# Patient Record
Sex: Female | Born: 1955 | Race: White | Hispanic: No | Smoking: Never smoker
Health system: Southern US, Community
[De-identification: ages and names within clinical notes are randomized; demographics above are authoritative.]

## PROBLEM LIST (undated history)

## (undated) DIAGNOSIS — K5792 Diverticulitis of intestine, part unspecified, without perforation or abscess without bleeding: Secondary | ICD-10-CM

## (undated) DIAGNOSIS — E7800 Pure hypercholesterolemia, unspecified: Secondary | ICD-10-CM

## (undated) DIAGNOSIS — I10 Essential (primary) hypertension: Secondary | ICD-10-CM

## (undated) DIAGNOSIS — K219 Gastro-esophageal reflux disease without esophagitis: Secondary | ICD-10-CM

## (undated) DIAGNOSIS — F329 Major depressive disorder, single episode, unspecified: Secondary | ICD-10-CM

## (undated) DIAGNOSIS — R197 Diarrhea, unspecified: Secondary | ICD-10-CM

## (undated) DIAGNOSIS — F32A Depression, unspecified: Secondary | ICD-10-CM

## (undated) HISTORY — DX: Pure hypercholesterolemia, unspecified: E78.00

## (undated) HISTORY — PX: PATELLA FRACTURE SURGERY: SHX735

## (undated) HISTORY — PX: BREAST ENHANCEMENT SURGERY: SHX7

## (undated) HISTORY — DX: Major depressive disorder, single episode, unspecified: F32.9

## (undated) HISTORY — PX: NASAL SINUS SURGERY: SHX719

## (undated) HISTORY — PX: AUGMENTATION MAMMAPLASTY: SUR837

## (undated) HISTORY — DX: Depression, unspecified: F32.A

## (undated) HISTORY — DX: Gastro-esophageal reflux disease without esophagitis: K21.9

## (undated) HISTORY — DX: Essential (primary) hypertension: I10

## (undated) HISTORY — PX: CHOLECYSTECTOMY: SHX55

## (undated) HISTORY — PX: APPENDECTOMY: SHX54

---

## 2000-09-07 ENCOUNTER — Ambulatory Visit (HOSPITAL_COMMUNITY): Admission: RE | Admit: 2000-09-07 | Discharge: 2000-09-07 | Payer: Self-pay | Admitting: Cardiology

## 2005-02-06 ENCOUNTER — Ambulatory Visit: Payer: Self-pay | Admitting: Cardiology

## 2008-05-30 ENCOUNTER — Ambulatory Visit: Payer: Self-pay | Admitting: Cardiology

## 2010-07-24 ENCOUNTER — Ambulatory Visit (INDEPENDENT_AMBULATORY_CARE_PROVIDER_SITE_OTHER): Payer: BC Managed Care – PPO | Admitting: Internal Medicine

## 2010-07-24 DIAGNOSIS — R1310 Dysphagia, unspecified: Secondary | ICD-10-CM

## 2010-07-24 DIAGNOSIS — R195 Other fecal abnormalities: Secondary | ICD-10-CM

## 2010-08-12 NOTE — Consult Note (Signed)
NAME:  Joanna Sutton, Joanna Sutton                ACCOUNT NO.:  [id number]  MEDICAL RECORD NO.:  [id number]            PATIENT TYPE:  LOCATION: [facility]            FACILITY:  PHYSICIAN: Dr. Karilyn Cota  DATE OF BIRTH:  Jan 29, 1956  DATE OF CONSULTATION:  07/24/2010 DATE OF DISCHARGE:                                CONSULTATION   REASON FOR CONSULTATION:  Heme-positive stools, anemia, and dysphagia.  HISTORY OF PRESENT ILLNESS:  Chaselynn is a 55 year old female referred to our office by Dr. Donzetta Sprung in [place].  It was noted on her last exam that she had 3 heme-positive stool cards.  She states she feels tired for the most part.  She has no energy.  She further states that when she has a bowel movement she would see blood in the stool and on the toilet tissue.  She also complains of seeing mucus.  She does have lower abdominal pain.  She states that when she walks she feels like her intestines are going to fall out.  She usually has one bowel movement a day.  Her stools are dark brown to light brown in color.  She says she sees blood about every time she has a bowel movement.  Her last colonoscopy was in 2010 by Dr. Gabriel Cirri and it was normal.  On June 16, 2010, noted her hemoglobin was 10.5, hematocrit was 32.1, her MCV was 93, RBC count was slightly low at 3.44.  Her iron profile was normal, iron binding capacity was 268, UIBC 220, serum iron 48, iron saturation 18, ferritin 28, vitamin B12 of 335, folate 12.8.  Her TSH was 0.891, thyroxine 4.7, T3 uptake 30, free thyroxine index was 1.4, all were normal.  Glucose 83, BUN 15, creatinine 0.92, sodium 142, potassium 3.6, chloride 107, calcium 8.7, total protein 5.8, albumin 4.0, total bilirubin is 0.1, ALP 38, AST 14, and ALT 17.  She has had a weight loss of 42 pounds, but this was intentional.  Today, she complains also of dysphagia.  Foods feel like they are lodging in her midesophagus.  This has been occurring for about a  year.  Foods such as fish and breads will lodge.  Red meats will also lodge.  This is occurring about twice a week.  She denies any acid reflux or diarrhea.  She is allergic to PENICILLIN.  HOME MEDICATIONS: 1. Simvastatin 40 mg a day. 2. Calcium 600 mg a day. 3. Aspirin 81 mg a day. 4. Celexa 40 mg a day. 5. Stanback as needed. 6. Lisinopril 10 mg a day.  SURGERIES:  She has had a cholecystectomy, C-section, and bilateral breast implants.  She has a history of hypertension and high cholesterol.  Her mother is alive with a history of an MI and CVA.  Father is alive with coronary artery disease and he had a CABG with a 7-vessel bypass and he has a history of COPD, one brother in good health.  She is divorced.  She is a retired [employer] loss adjuster.  She occasionally smokes. She does not drink or do drugs.  She has one child in good health.  OBJECTIVE:  VITAL SIGNS:  Her weight is 130, height is 5 feet and  3 inches, temp is 98, blood pressure is 100/58, and pulse is 76. HEENT:  She has natural teeth.  Her oral mucosa is moist.  There are no lesions.  Her conjunctivae are pink.  Her sclerae are anicteric. NECK:  Her thyroid is normal.  There is no cervical lymphadenopathy. LUNGS:  Clear. HEART:  Regular rate and rhythm. ABDOMEN:  Soft.  Bowel sounds are positive.  No masses.  She does have tenderness on palpation to her lower abdomen.  Her stool was guaiac negative today.  ASSESSMENT:  Rotunda is a 55 year old female presenting today with 3 heme- positive stool cards.  Her hemoglobin is slightly low at 10.5 and hematocrit 32.  Her MCV is normal.  Her iron studies were normal. Inflammatory bowel disease or colonic neoplasm, AVM or polyp, or ulcer need to be ruled out.  She also complains of dysphagia.  An esophageal stricture needs to be ruled out.  RECOMMENDATIONS:  We will schedule a colonoscopy and EGD/ED with Dr. Karilyn Cota.  The risks and benefits were reviewed with the patient and  she is agreeable and I did discuss this case with Dr. Karilyn Cota.    ______________________________ Dorene Ar, NP   ______________________________ Dr. Karilyn Cota    TS/MEDQ  D:  07/24/2010  T:  07/25/2010  Job:  [id number]  Electronically Signed by Dorene Ar PA on 08/11/2010 09:03:33 AM Electronically Signed by Lorrin Goodell M.D. on 08/12/2010 10:51:41 AM

## 2010-08-14 ENCOUNTER — Ambulatory Visit (HOSPITAL_COMMUNITY)
Admission: RE | Admit: 2010-08-14 | Discharge: 2010-08-14 | Disposition: A | Discharge status: Home / Self Care | Payer: BC Managed Care – PPO | Source: Ambulatory Visit | Attending: Internal Medicine | Admitting: Internal Medicine

## 2010-08-14 ENCOUNTER — Other Ambulatory Visit (INDEPENDENT_AMBULATORY_CARE_PROVIDER_SITE_OTHER): Payer: Self-pay | Admitting: Internal Medicine

## 2010-08-14 ENCOUNTER — Encounter (HOSPITAL_BASED_OUTPATIENT_CLINIC_OR_DEPARTMENT_OTHER): Payer: BC Managed Care – PPO | Admitting: Internal Medicine

## 2010-08-14 DIAGNOSIS — Z79899 Other long term (current) drug therapy: Secondary | ICD-10-CM | POA: Insufficient documentation

## 2010-08-14 DIAGNOSIS — K449 Diaphragmatic hernia without obstruction or gangrene: Secondary | ICD-10-CM

## 2010-08-14 DIAGNOSIS — K921 Melena: Secondary | ICD-10-CM | POA: Insufficient documentation

## 2010-08-14 DIAGNOSIS — D509 Iron deficiency anemia, unspecified: Secondary | ICD-10-CM | POA: Insufficient documentation

## 2010-08-14 DIAGNOSIS — K625 Hemorrhage of anus and rectum: Secondary | ICD-10-CM

## 2010-08-14 DIAGNOSIS — K5730 Diverticulosis of large intestine without perforation or abscess without bleeding: Secondary | ICD-10-CM

## 2010-08-14 DIAGNOSIS — R1310 Dysphagia, unspecified: Secondary | ICD-10-CM | POA: Insufficient documentation

## 2010-08-14 DIAGNOSIS — E785 Hyperlipidemia, unspecified: Secondary | ICD-10-CM | POA: Insufficient documentation

## 2010-08-14 DIAGNOSIS — K222 Esophageal obstruction: Secondary | ICD-10-CM

## 2010-08-14 DIAGNOSIS — K259 Gastric ulcer, unspecified as acute or chronic, without hemorrhage or perforation: Secondary | ICD-10-CM

## 2010-08-14 DIAGNOSIS — K633 Ulcer of intestine: Secondary | ICD-10-CM | POA: Insufficient documentation

## 2010-08-14 DIAGNOSIS — D649 Anemia, unspecified: Secondary | ICD-10-CM

## 2010-08-14 DIAGNOSIS — I10 Essential (primary) hypertension: Secondary | ICD-10-CM | POA: Insufficient documentation

## 2010-08-14 LAB — HEMOGLOBIN AND HEMATOCRIT, BLOOD: HCT: 32.8 % — ABNORMAL LOW (ref 36.0–46.0)

## 2010-08-15 LAB — H. PYLORI ANTIBODY, IGG: H Pylori IgG: <0.4 {ISR}

## 2010-09-01 NOTE — Op Note (Signed)
NAME:  Joanna Sutton, Joanna Sutton                ACCOUNT NO.:  [id number]  MEDICAL RECORD NO.:  [id number]           PATIENT TYPE:  O  LOCATION:  [facility]                          FACILITY:  [facility]  PHYSICIAN:  Lionel December, M.D.    DATE OF BIRTH:  July 15, 1955  DATE OF PROCEDURE:  08/14/2010 DATE OF DISCHARGE:                              OPERATIVE REPORT   PROCEDURE:  Esophagogastroduodenoscopy with esophageal dilation followed by colonoscopy.  INDICATION:  Joanna Sutton is 55 year old Caucasian female who has dysphagia to solids.  She denies heartburn.  She is also found to be anemic.  She has had multiple heme-positive stools.  She also gives history of passing bright red blood with bowel movements and at times, burgundy stools. She is on low-dose asa and also takes 4-5 Stanback every day for headache.  The patient's last colonoscopy was up in [place] 2 years ago and the colon was normal.  Procedures were reviewed with the patient.  Informed consent was obtained.  MEDS FOR CONSCIOUS SEDATION:  Cetacaine spray for pharyngeal topical anesthesia, Demerol 50 mg IV, Versed 9 mg IV.  FINDINGS:  Procedure performed in endoscopy suite.  The patient's vital signs and O2 sat were monitored during the procedure and remained stable.  PROCEDURES: 1. Esophagogastroduodenoscopy.  The patient was placed in left lateral     recumbent position and Pentax videoscope was passed through     oropharynx without any difficulty into esophagus. 2. Esophagus.  Mucosa of the esophagus normal.  High-grade ring noted     at GE junction, was located at 32 cm from incisors.  Hiatus was 35. 3. Stomach.  It was empty and distended very well by insufflation.     Folds of proximal stomach were normal.  Examination of mucosa     revealed erosions at antrum along with edema, erythema, and a deep     ulcer on the right side towards the lesser curvature.  This ulcer     was about 3 x 6 mm, difficult to see.  Pyloric channel was patent.   Angularis, fundus, and cardia were examined by retroflexion of     scope and were normal. 4. Duodenum.  Bulbar and postbulbar mucosa and folds were normal.  Endoscope was pulled back to the stomach.  Balloon dilator was passed to the scope.  Guidewire was pushed into gastric lumen.  Balloon dilator was positioned across the ring, was dilated to a diameter of 15 mm, 16.5, and finally to 18 mm.  This disrupted ring effectively.  Balloon was deflated and withdrawn.  Endoscope was also withdrawn.  The patient was prepared for procedure #2.  COLONOSCOPY:  Rectal examination performed.  No abnormality noted on external or digital exam.  Pentax videoscope was placed through rectum and advanced under vision into sigmoid colon and beyond.  Preparation was fair.  She still has some pieces of formed stool and lot of liquid stool.  Few scattered diverticula noted at sigmoid colon.  Scope was passed into cecum which was identified by appendiceal orifice and ileocecal valve.  Pictures were taken for the record.  As the scope was withdrawn,  colonic mucosa was carefully examined.  There was a single ulcer at distal sigmoid colon about 10 x 12 mm.  Endoscopically, appeared to be benign ulcer.  Pictures taken followed by biopsy.  Rectal mucosa was normal.  Scope was retroflexed to examine anorectal junction and small hemorrhoids noted below the dentate line.  Withdrawal time was 11 minutes.  The patient tolerated the procedure well.  FINAL DIAGNOSES: 1. High-grade Schatzki's ring which was disrupted with a balloon     dilation up to 18 mm. 2. A 3-cm size sliding hiatal hernia. 3. Gastric ulcer at antrum along with multiple erosions.  Suspect     these changes are secondary to nonsteroidal antiinflammatory drug     therapy with need to rule out H. pylori infection. 4. Colonoscopy completed to cecum. 5. Few diverticula at sigmoid colon. 6. A single 10 x 12 mm ulcer at distal sigmoid colon, possibly  related     to nonsteroidal antiinflammatory drug therapy, ulcer was biopsied     for histology.  Small external hemorrhoids.  RECOMMENDATIONS: 1. The patient advised not to take any Stanback or similar OTC preps. 2. She can continue asa 81 mg daily. 3. Pantoprazole 40 mg p.o. b.i.d. 4. We will check her H and H and H. pylori today. 5. High-fiber diet.  I will be contacting patient with pending results and further recommendations.          ______________________________ Lionel December, M.D.     NR/MEDQ  D:  08/14/2010  T:  08/14/2010  Job:  [id number]  cc:   Donzetta Sprung Fax: [phone]  Electronically Signed by Lionel December M.D. on 09/01/2010 06:36:47 PM

## 2010-12-01 ENCOUNTER — Encounter (INDEPENDENT_AMBULATORY_CARE_PROVIDER_SITE_OTHER): Payer: Self-pay | Admitting: *Deleted

## 2010-12-18 ENCOUNTER — Ambulatory Visit (INDEPENDENT_AMBULATORY_CARE_PROVIDER_SITE_OTHER): Payer: BC Managed Care – PPO | Admitting: Internal Medicine

## 2012-11-29 ENCOUNTER — Ambulatory Visit (INDEPENDENT_AMBULATORY_CARE_PROVIDER_SITE_OTHER): Payer: BC Managed Care – PPO | Admitting: Internal Medicine

## 2012-11-29 ENCOUNTER — Encounter (INDEPENDENT_AMBULATORY_CARE_PROVIDER_SITE_OTHER): Payer: Self-pay | Admitting: Internal Medicine

## 2012-11-29 ENCOUNTER — Encounter (INDEPENDENT_AMBULATORY_CARE_PROVIDER_SITE_OTHER): Payer: Self-pay | Admitting: *Deleted

## 2012-11-29 VITALS — BP 90/42 | HR 76 | Temp 98.2°F | Ht 64.0 in | Wt 135.0 lb

## 2012-11-29 DIAGNOSIS — I10 Essential (primary) hypertension: Secondary | ICD-10-CM

## 2012-11-29 DIAGNOSIS — R1310 Dysphagia, unspecified: Secondary | ICD-10-CM | POA: Insufficient documentation

## 2012-11-29 DIAGNOSIS — E7800 Pure hypercholesterolemia, unspecified: Secondary | ICD-10-CM

## 2012-11-29 NOTE — Progress Notes (Signed)
Subjective:     Patient ID: Joanna Sutton, female   DOB: [birth year], 57 y.o.   MRN: [id number]  HPI Presents today with c/o dysphagia. She occasionally has foods that are lodging. This occurs in her upper esophagus. Symptoms for 6-7 months. She has to vomit the bolus up. Grilled peppers and onions and spicy foods are lodging. Chicken and steak are not lodging. She is chewing her food will.  She is not taking any aleve or ASA. She is avoiding greenpeppers, onions, spicy foods. She is avoiding biscuits. She takes the Protonix 40mg  BID x 1 month. She was taking Prilosec twice a day. Appetite is good. No weight loss. No abdominal pain. She usually has a BM about once a day. No melena or bright red rectal bleeding. Her last EGD/ED was in 2012  08/14/2010 EGD/ED/Colonoscopy: Dysphagia, Heme positive stools, bright red rectal bleeding FINAL DIAGNOSES:  1. High-grade Schatzki's ring which was disrupted with a balloon  dilation up to 18 mm.  2. A 3-cm size sliding hiatal hernia.  3. Gastric ulcer at antrum along with multiple erosions. Suspect  these changes are secondary to nonsteroidal antiinflammatory drug  therapy with need to rule out H. pylori infection.  4. Colonoscopy completed to cecum.  5. Few diverticula at sigmoid colon.  6. A single 10 x 12 mm ulcer at distal sigmoid colon, possibly related  to nonsteroidal antiinflammatory drug therapy, ulcer was biopsied  for histology. Small external hemorrhoids.  Review of Systems see hpi Current Outpatient Prescriptions  Medication Sig Dispense Refill  . acetaminophen (TYLENOL) 500 MG tablet Take 500 mg by mouth every 6 (six) hours as needed for pain.      Marland Kitchen atorvastatin (LIPITOR) 40 MG tablet Take 40 mg by mouth daily.      . COD LIVER OIL PO Take by mouth.      . diphenhydrAMINE (BENADRYL) 25 mg capsule Take 25 mg by mouth every 6 (six) hours as needed for itching.      . DULoxetine (CYMBALTA) 60 MG capsule Take 60 mg by mouth daily.       . furosemide (LASIX) 20 MG tablet Take 20 mg by mouth as needed.      . Iron-Vitamins (GERITOL PO) Take by mouth.      Marland Kitchen lisinopril-hydrochlorothiazide (PRINZIDE,ZESTORETIC) 10-12.5 MG per tablet Take 1 tablet by mouth daily.      . pantoprazole (PROTONIX) 20 MG tablet Take 20 mg by mouth 2 (two) times daily before a meal.      . traZODone (DESYREL) 50 MG tablet Take 50 mg by mouth at bedtime.       No current facility-administered medications for this visit.   Past Medical History  Diagnosis Date  . Hypertension   . Depression   . High cholesterol   . GERD (gastroesophageal reflux disease)    Past Surgical History  Procedure Laterality Date  . Cholecystectomy    . Appendectomy    . Breast enhancement surgery    . Cesarean section    . Nasal sinus surgery     Allergies  Allergen Reactions  . Penicillins         Objective:   Physical Exam   Filed Vitals:   11/29/12 1414  BP: 90/42  Pulse: 76  Temp: 98.2 F (36.8 C)  Height: 5\' 4"  (1.626 m)  Weight: 135 lb (61.236 kg)   Alert and oriented. Skin warm and dry. Oral mucosa is moist.   . Sclera anicteric, conjunctivae is  pink. Thyroid not enlarged. No cervical lymphadenopathy. Lungs clear. Heart regular rate and rhythm.  Abdomen is soft. Bowel sounds are positive. No hepatomegaly. No abdominal masses felt. No tenderness.  No edema to lower extremities.        Assessment:    Dysphagia to solids. Occuring 1-2 twice a week. She watches what she eats.    Plan:   EGD/ED with Dr Karilyn Cota. The risks and benefits such as perforation, bleeding, and infection were reviewed with the patient and is agreeable. Continue the Protonix.

## 2012-11-29 NOTE — Patient Instructions (Addendum)
EGD/ED. The risks and benefits such as perforation, bleeding, and infection were reviewed with the patient and is agreeable. 

## 2012-12-01 ENCOUNTER — Other Ambulatory Visit (INDEPENDENT_AMBULATORY_CARE_PROVIDER_SITE_OTHER): Payer: Self-pay | Admitting: *Deleted

## 2012-12-01 DIAGNOSIS — R1310 Dysphagia, unspecified: Secondary | ICD-10-CM

## 2012-12-21 ENCOUNTER — Encounter (HOSPITAL_COMMUNITY): Payer: Self-pay | Admitting: Pharmacy Technician

## 2013-01-04 ENCOUNTER — Ambulatory Visit (HOSPITAL_COMMUNITY)
Admission: RE | Admit: 2013-01-04 | Payer: BC Managed Care – PPO | Source: Ambulatory Visit | Admitting: Internal Medicine

## 2013-01-04 ENCOUNTER — Encounter (HOSPITAL_COMMUNITY): Admission: RE | Payer: Self-pay | Source: Ambulatory Visit

## 2013-01-04 SURGERY — ESOPHAGOGASTRODUODENOSCOPY (EGD) WITH ESOPHAGEAL DILATION
Anesthesia: Moderate Sedation

## 2013-02-10 ENCOUNTER — Encounter (INDEPENDENT_AMBULATORY_CARE_PROVIDER_SITE_OTHER): Payer: Self-pay

## 2014-09-19 ENCOUNTER — Ambulatory Visit (INDEPENDENT_AMBULATORY_CARE_PROVIDER_SITE_OTHER): Payer: BC Managed Care – PPO | Admitting: Internal Medicine

## 2014-09-19 ENCOUNTER — Encounter (INDEPENDENT_AMBULATORY_CARE_PROVIDER_SITE_OTHER): Payer: Self-pay | Admitting: Internal Medicine

## 2014-09-19 VITALS — BP 82/52 | HR 72 | Temp 98.4°F | Ht 62.0 in | Wt 120.9 lb

## 2014-09-19 DIAGNOSIS — R197 Diarrhea, unspecified: Secondary | ICD-10-CM

## 2014-09-19 MED ORDER — METRONIDAZOLE 250 MG PO TABS: 250.0000 mg | ORAL_TABLET | Freq: Three times a day (TID) | ORAL | Status: DC

## 2014-09-19 NOTE — Patient Instructions (Signed)
GI pathogen EGD/ED The risks and benefits such as perforation, bleeding, and infection were reviewed with the patient and is agreeable

## 2014-09-19 NOTE — Progress Notes (Signed)
Subjective:    Patient ID: Joanna Sutton, female    DOB: 12/08/1955, 60 y.o.   MRN: [id number]  HPI presents today with c/o that she thought she had a virus. On Aug 28, 2014 she visited a friend at a nursing home. Two days later she started to have vomiting and diarrhea.  The vomiting and diarrhea lasted about x 6-7 hours. The diarrhea lasted about 2 weeks. She saw Dr. Olena Heckle and was given an antibiotic. She was placed on Keflex x 2 weeks for the virus.  She had seen blood in her stool. Stool was negative for blood. She tells me she is still wearing Depends because of the diarrhea. She has incontinence. She says she does not have an appetite. She has early satiety.  She has had weight loss. IN 2014 she weighed 136. Today she weight 136. She c/o belching and her esophagus feels like it is on fire. She does have some dyphagia.  She takes NSAIDS on daily basis for headaches.        08/14/2010 EGD/ED/Colonoscopy: Dysphagia, Heme positive stools, bright red rectal bleeding FINAL DIAGNOSES:  1. High-grade Schatzki's ring which was disrupted with a balloon  dilation up to 18 mm.  2. A 3-cm size sliding hiatal hernia.  3. Gastric ulcer at antrum along with multiple erosions. Suspect  these changes are secondary to nonsteroidal antiinflammatory drug  therapy with need to rule out H. pylori infection.  4. Colonoscopy completed to cecum.  5. Few diverticula at sigmoid colon.  6. A single 10 x 12 mm ulcer at distal sigmoid colon, possibly related  to nonsteroidal antiinflammatory drug therapy, ulcer was biopsied  for histology. Small external hemorrhoids.   Review of Systems  Divorced. One child in good health.  Past Medical History  Diagnosis Date  . Hypertension   . Depression   . High cholesterol   . GERD (gastroesophageal reflux disease)     Past Surgical History  Procedure Laterality Date  . Cholecystectomy    . Appendectomy    . Breast enhancement surgery    . Cesarean  section    . Nasal sinus surgery      Allergies  Allergen Reactions  . Penicillins Rash    Current Outpatient Prescriptions on File Prior to Visit  Medication Sig Dispense Refill  . acetaminophen (TYLENOL) 500 MG tablet Take 500 mg by mouth every 6 (six) hours as needed for pain.    Marland Kitchen atorvastatin (LIPITOR) 40 MG tablet Take 40 mg by mouth daily.    . diphenhydrAMINE (BENADRYL) 25 mg capsule Take 25 mg by mouth every 6 (six) hours as needed for itching.    . pantoprazole (PROTONIX) 20 MG tablet Take 20 mg by mouth 2 (two) times daily before a meal.     No current facility-administered medications on file prior to visit.        Objective:   Physical ExamBlood pressure 82/52, pulse 72, temperature 98.4 F (36.9 C), height 5\' 2"  (1.575 m), weight 120 lb 14.4 oz (54.84 kg).  Alert and oriented. Skin warm and dry. Oral mucosa is moist.   . Sclera anicteric, conjunctivae is pink. Thyroid not enlarged. No cervical lymphadenopathy. Lungs clear. Heart regular rate and rhythm.  Abdomen is soft. Bowel sounds are positive. No hepatomegaly. No abdominal masses felt. No tendernes1+ edema to left  lower extremities.        Assessment & Plan:  GERD, not controlled at this time. Continue the Protonix. Avoid spicy foods.  Stop all NSAIDS. GI pathogen. Flagyl 250mg  TID x  10 days.  EGD/ED

## 2014-09-20 ENCOUNTER — Telehealth (INDEPENDENT_AMBULATORY_CARE_PROVIDER_SITE_OTHER): Payer: Self-pay | Admitting: Internal Medicine

## 2014-09-20 NOTE — Telephone Encounter (Signed)
Message left not to start the antibiotic till she has the stool specimen. (cell phone)

## 2014-09-24 ENCOUNTER — Other Ambulatory Visit (INDEPENDENT_AMBULATORY_CARE_PROVIDER_SITE_OTHER): Payer: Self-pay | Admitting: *Deleted

## 2014-09-24 DIAGNOSIS — R1310 Dysphagia, unspecified: Secondary | ICD-10-CM

## 2014-09-24 LAB — GASTROINTESTINAL PATHOGEN PANEL PCR

## 2014-09-26 ENCOUNTER — Other Ambulatory Visit (INDEPENDENT_AMBULATORY_CARE_PROVIDER_SITE_OTHER): Payer: Self-pay | Admitting: Internal Medicine

## 2014-09-27 ENCOUNTER — Encounter (HOSPITAL_COMMUNITY)
Admission: RE | Disposition: A | Discharge status: Home / Self Care | Payer: Self-pay | Source: Ambulatory Visit | Attending: Internal Medicine

## 2014-09-27 ENCOUNTER — Ambulatory Visit (HOSPITAL_COMMUNITY)
Admission: RE | Admit: 2014-09-27 | Discharge: 2014-09-27 | Disposition: A | Discharge status: Home / Self Care | Payer: BC Managed Care – PPO | Source: Ambulatory Visit | Attending: Internal Medicine | Admitting: Internal Medicine

## 2014-09-27 ENCOUNTER — Encounter (HOSPITAL_COMMUNITY): Payer: Self-pay | Admitting: *Deleted

## 2014-09-27 DIAGNOSIS — R1310 Dysphagia, unspecified: Secondary | ICD-10-CM

## 2014-09-27 DIAGNOSIS — K257 Chronic gastric ulcer without hemorrhage or perforation: Secondary | ICD-10-CM | POA: Diagnosis not present

## 2014-09-27 DIAGNOSIS — Z881 Allergy status to other antibiotic agents status: Secondary | ICD-10-CM | POA: Insufficient documentation

## 2014-09-27 DIAGNOSIS — K449 Diaphragmatic hernia without obstruction or gangrene: Secondary | ICD-10-CM | POA: Insufficient documentation

## 2014-09-27 DIAGNOSIS — K222 Esophageal obstruction: Secondary | ICD-10-CM | POA: Diagnosis not present

## 2014-09-27 DIAGNOSIS — I10 Essential (primary) hypertension: Secondary | ICD-10-CM | POA: Insufficient documentation

## 2014-09-27 DIAGNOSIS — E780 Pure hypercholesterolemia: Secondary | ICD-10-CM | POA: Diagnosis not present

## 2014-09-27 DIAGNOSIS — K2950 Unspecified chronic gastritis without bleeding: Secondary | ICD-10-CM | POA: Insufficient documentation

## 2014-09-27 DIAGNOSIS — F329 Major depressive disorder, single episode, unspecified: Secondary | ICD-10-CM | POA: Diagnosis not present

## 2014-09-27 DIAGNOSIS — Z880 Allergy status to penicillin: Secondary | ICD-10-CM | POA: Diagnosis not present

## 2014-09-27 DIAGNOSIS — K259 Gastric ulcer, unspecified as acute or chronic, without hemorrhage or perforation: Secondary | ICD-10-CM | POA: Insufficient documentation

## 2014-09-27 DIAGNOSIS — K2210 Ulcer of esophagus without bleeding: Secondary | ICD-10-CM | POA: Diagnosis not present

## 2014-09-27 DIAGNOSIS — Q394 Esophageal web: Secondary | ICD-10-CM | POA: Diagnosis not present

## 2014-09-27 DIAGNOSIS — K219 Gastro-esophageal reflux disease without esophagitis: Secondary | ICD-10-CM | POA: Diagnosis not present

## 2014-09-27 HISTORY — PX: ESOPHAGOGASTRODUODENOSCOPY: SHX5428

## 2014-09-27 HISTORY — PX: ESOPHAGEAL DILATION: SHX303

## 2014-09-27 HISTORY — DX: Diarrhea, unspecified: R19.7

## 2014-09-27 LAB — GASTROINTESTINAL PATHOGEN PANEL PCR
C. difficile Tox A/B, PCR: NEGATIVE
CAMPYLOBACTER, PCR: NEGATIVE
Cryptosporidium, PCR: NEGATIVE
E COLI (ETEC) LT/ST, PCR: NEGATIVE
E COLI 0157, PCR: NEGATIVE
E coli (STEC) stx1/stx2, PCR: NEGATIVE
Giardia lamblia, PCR: NEGATIVE
NOROVIRUS, PCR: NEGATIVE
Rotavirus A, PCR: NEGATIVE
Salmonella, PCR: NEGATIVE
Shigella, PCR: NEGATIVE

## 2014-09-27 SURGERY — EGD (ESOPHAGOGASTRODUODENOSCOPY)
Anesthesia: Moderate Sedation

## 2014-09-27 MED ORDER — MIDAZOLAM HCL 5 MG/5ML IJ SOLN
INTRAMUSCULAR | Status: AC
  Filled 2014-09-27: qty 10

## 2014-09-27 MED ORDER — DICYCLOMINE HCL 10 MG PO CAPS: 10.0000 mg | ORAL_CAPSULE | Freq: Three times a day (TID) | ORAL | Status: DC | PRN

## 2014-09-27 MED ORDER — STERILE WATER FOR IRRIGATION IR SOLN
Status: DC | PRN
  Administered 2014-09-27: 09:00:00

## 2014-09-27 MED ORDER — SODIUM CHLORIDE 0.9 % IV SOLN
INTRAVENOUS | Status: DC
  Administered 2014-09-27: 09:00:00 via INTRAVENOUS

## 2014-09-27 MED ORDER — BUTAMBEN-TETRACAINE-BENZOCAINE 2-2-14 % EX AERO
INHALATION_SPRAY | CUTANEOUS | Status: DC | PRN
Start: 2014-09-27 — End: 2014-09-27
  Administered 2014-09-27: 2 via TOPICAL

## 2014-09-27 MED ORDER — MEPERIDINE HCL 50 MG/ML IJ SOLN
INTRAMUSCULAR | Status: AC
  Filled 2014-09-27: qty 1

## 2014-09-27 MED ORDER — PANTOPRAZOLE SODIUM 40 MG PO TBEC: 40.0000 mg | DELAYED_RELEASE_TABLET | Freq: Two times a day (BID) | ORAL | Status: AC

## 2014-09-27 MED ORDER — MIDAZOLAM HCL 5 MG/5ML IJ SOLN
INTRAMUSCULAR | Status: DC | PRN
  Administered 2014-09-27: 1 mg via INTRAVENOUS
  Administered 2014-09-27 (×3): 2 mg via INTRAVENOUS

## 2014-09-27 MED ORDER — MEPERIDINE HCL 50 MG/ML IJ SOLN
INTRAMUSCULAR | Status: DC | PRN
  Administered 2014-09-27: 25 mg via INTRAVENOUS

## 2014-09-27 NOTE — Discharge Instructions (Signed)
Stop metronidazole. Do not take alendronate for 4 weeks. Pantoprazole 40 mg by mouth 30 minutes before breakfast and evening meal daily. Dicyclomine 10 mg by mouth up to 3 times a day a day as needed before meals. Resume other medications as before. Resume usual diet. Do not take ibuprofen and Aleve or other OTC NSAIDs but can take Tylenol up to 2 g per day as needed. Physician will call with results of biopsy and stool studies.  PATIENT INSTRUCTIONS POST-ANESTHESIA  IMMEDIATELY FOLLOWING SURGERY:  Do not drive or operate machinery for the first twenty four hours after surgery.  Do not make any important decisions for twenty four hours after surgery or while taking narcotic pain medications or sedatives.  If you develop intractable nausea and vomiting or a severe headache please notify your doctor immediately.  FOLLOW-UP:  Please make an appointment with your surgeon as instructed. You do not need to follow up with anesthesia unless specifically instructed to do so.  WOUND CARE INSTRUCTIONS (if applicable):  Keep a dry clean dressing on the anesthesia/puncture wound site if there is drainage.  Once the wound has quit draining you may leave it open to air.  Generally you should leave the bandage intact for twenty four hours unless there is drainage.  If the epidural site drains for more than 36-48 hours please call the anesthesia department.  QUESTIONS?:  Please feel free to call your physician or the hospital operator if you have any questions, and they will be happy to assist you.

## 2014-09-27 NOTE — Op Note (Signed)
EGD PROCEDURE REPORT  PATIENT:  Joanna Sutton  MR#:  [id number] Birthdate:  May 14, 1955, 59 y.o., female Endoscopist:  Dr. Rogene Houston, MD Referred By:  Dr. Gar Ponto, MD Procedure Date: 09/27/2014  Procedure:   EGD with ED  Indications:  Patient is 60 year old Caucasian female who presents with several week history of dysphagia with solids as well as pills. She has history of Schatzki's ring which was last dilated disrupted in May 2012. Patient also has diarrhea 4 weeks duration unresponsive to metronidazole and stool studies are pending.            Informed Consent:  The risks, benefits, alternatives & imponderables which include, but are not limited to, bleeding, infection, perforation, drug reaction and potential missed lesion have been reviewed.  The potential for biopsy, lesion removal, esophageal dilation, etc. have also been discussed.  Questions have been answered.  All parties agreeable.  Please see history & physical in medical record for more information.  Medications:  Demerol 25 mg IV Versed 7 mg IV Cetacaine spray topically for oropharyngeal anesthesia  Description of procedure:  The endoscope was introduced through the mouth and advanced to the second portion of the duodenum without difficulty or limitations. The mucosal surfaces were surveyed very carefully during advancement of the scope and upon withdrawal.  Findings:  Esophagus:  Mucosal appearance and proximal esophagus suggestive for web. Coarse appearance to mucosa at esophageal body with subtle circumferential ring. Small ulcer distal esophagus extending to GE junction. Prominent ring noted at GE junction. GEJ:  32 cm Hiatus:  30 cm Stomach:  Stomach was empty and distended very well with insufflation. Folds in the proximal stomach were normal. Examination of mucosa at gastric body was normal. 2 ulcers noted at gastric antrum. One towards anterior wall was 4 mm. Second ulcer towards lesser curvature was 6 mm and  quite deep. This ulcer was biopsied for histology. Marked edema and erythema noted to mucosa in the prepyloric/pyloric region.pyloric channel was patent.  Duodenum:  Normal bulbar and post bulbar mucosa.   Therapeutic/Diagnostic Maneuvers Performed:   Esophagus dilated by passing 46 French Maloney dilator to full insertion. Esophagus examined post dilation and linear mucosal disruption noted at cervical esophagus and ring was also noted to have been disrupted. Multiple biopsies taken from gastric ulcer and esophageal body.  Complications:  None  Impression: Esophageal web and prominent Schatzki's ring.Both of these were disrupted by passing 36 [place] Maloney dilator.  Small ulcer at distal esophagus extending into GE junction. Small sliding hiatal hernia. Two gastric ulcers at antrum measuring 4 and 6 mm. Ulcer towards lesser curvature was much deeper. It was biopsied for histology. Mark edema to prepyloric and pyloric channel mucosa.  Recommendations:  Discontinue metronidazole. Do not take alendronate for 4 weeks. Increase pantoprazole to 40 mg by mouth twice a day. Dicyclomine 10 mg by mouth 3 times a day when necessary. Patient advised not to take NSAIDs. I will be contacting patient with results of biopsy and stool studies. Follow-up EGD in 12 weeks.  Fleda Pagel U  09/27/2014  10:01 AM  CC: Dr. Gar Ponto, MD & Dr. Rayne Du ref. provider found

## 2014-09-27 NOTE — H&P (Signed)
Joanna Sutton is an 59 y.o. female.   Chief Complaint: Patient is here for EGD and ED. HPI: Patient is 59 year old Caucasian female with history of GERD and Schatzki's ring was esophagus was last dilated in May 2012 now presents with recurrent solid food dysphagia. She's also having difficulty with pills. She had multiple episodes where she has to regurgitate food in order to get relief. Heartburns well controlled with PPI. She also complains of one month history of diarrhea. Symptoms began with nausea vomiting abdominal pain and diarrhea. Nausea vomiting is improved with diarrhea has not. She was seen in the office last week. GI pathogen panel was requested and she provided stool yesterday. She has been on metronidazole for 6 days but cannot tell any difference. She did take Keflex for 1 week before her symptoms started. She has lost 6 pounds. She has not had fever melena or rectal bleeding.  Past Medical History  Diagnosis Date  . Hypertension   . Depression   . High cholesterol   . GERD (gastroesophageal reflux disease)   . Diarrhea     Past Surgical History  Procedure Laterality Date  . Cholecystectomy    . Appendectomy    . Breast enhancement surgery    . Cesarean section    . Nasal sinus surgery      History reviewed. No pertinent family history. Social History:  reports that she has never smoked. She does not have any smokeless tobacco history on file. She reports that she does not drink alcohol or use illicit drugs.  Allergies:  Allergies  Allergen Reactions  . Levaquin [Levofloxacin] Nausea And Vomiting  . Penicillins Rash    Medications Prior to Admission  Medication Sig Dispense Refill  . alendronate (FOSAMAX) 70 MG tablet Take 1 tablet by mouth once a week.    Marland Kitchen atorvastatin (LIPITOR) 40 MG tablet Take 40 mg by mouth daily.    Marland Kitchen escitalopram (LEXAPRO) 10 MG tablet Take 10 mg by mouth daily.    . metroNIDAZOLE (FLAGYL) 250 MG tablet Take 1 tablet (250 mg total) by  mouth 3 (three) times daily. 30 tablet 0  . pantoprazole (PROTONIX) 40 MG tablet Take 1 tablet by mouth daily.    Marland Kitchen acetaminophen (TYLENOL) 500 MG tablet Take 500 mg by mouth every 6 (six) hours as needed for pain.    . diphenhydrAMINE (BENADRYL) 25 mg capsule Take 25 mg by mouth every 6 (six) hours as needed for itching.      No results found for this or any previous visit (from the past 48 hour(s)). No results found.  ROS  Blood pressure 86/48, pulse 62, temperature 98 F (36.7 C), temperature source Oral, resp. rate 13, height 5\' 2"  (1.575 m), weight 120 lb (54.432 kg), SpO2 98 %. Physical Exam  Constitutional: She appears well-developed and well-nourished.  HENT:  Mouth/Throat: Oropharynx is clear and moist.  Eyes: Conjunctivae are normal. No scleral icterus.  Neck: No thyromegaly present.  Cardiovascular: Normal rate, regular rhythm and normal heart sounds.   No murmur heard. Respiratory: Effort normal and breath sounds normal.  GI: Soft. She exhibits no distension and no mass. There is tenderness (mild tenderness at left mid abdomen).  Musculoskeletal: She exhibits no edema.  Lymphadenopathy:    She has no cervical adenopathy.  Neurological: She is alert.  Skin: Skin is warm and dry.     Assessment/Plan Solid food dysphagia. History of Schatzki's ring. Diarrhea. EGD with ED.  Joanna Sutton U 09/27/2014, 9:26 AM

## 2014-10-01 ENCOUNTER — Encounter (HOSPITAL_COMMUNITY): Payer: Self-pay | Admitting: Internal Medicine

## 2014-10-03 ENCOUNTER — Encounter (INDEPENDENT_AMBULATORY_CARE_PROVIDER_SITE_OTHER): Payer: Self-pay | Admitting: *Deleted

## 2014-11-22 ENCOUNTER — Other Ambulatory Visit (INDEPENDENT_AMBULATORY_CARE_PROVIDER_SITE_OTHER): Payer: Self-pay | Admitting: *Deleted

## 2014-11-22 ENCOUNTER — Encounter (INDEPENDENT_AMBULATORY_CARE_PROVIDER_SITE_OTHER): Payer: Self-pay | Admitting: *Deleted

## 2014-11-22 ENCOUNTER — Encounter (INDEPENDENT_AMBULATORY_CARE_PROVIDER_SITE_OTHER): Payer: Self-pay | Admitting: Internal Medicine

## 2014-11-22 ENCOUNTER — Ambulatory Visit (INDEPENDENT_AMBULATORY_CARE_PROVIDER_SITE_OTHER): Payer: BC Managed Care – PPO | Admitting: Internal Medicine

## 2014-11-22 VITALS — BP 102/52 | HR 64 | Temp 98.1°F | Ht 62.0 in | Wt 119.3 lb

## 2014-11-22 DIAGNOSIS — K2210 Ulcer of esophagus without bleeding: Secondary | ICD-10-CM

## 2014-11-22 DIAGNOSIS — K259 Gastric ulcer, unspecified as acute or chronic, without hemorrhage or perforation: Secondary | ICD-10-CM

## 2014-11-22 NOTE — Patient Instructions (Addendum)
EGD. No NSAIDs

## 2014-11-22 NOTE — Progress Notes (Signed)
Subjective:    Patient ID: Joanna Sutton, female    DOB: 05/18/1955, 59 y.o.   MRN: [id number]  HPI Here today for f/u after recent EGD/ED in June. Please see below.  She tells me she feels good. Appetite is good. Protonix BID helps her acid reflux. No abdominal pain. She usually has a BM x 2 daily. She is not having any diarrhea. No melena or BRR.     09/27/2014 EGD with ED  Indications: Patient is 59 year old Caucasian female who presents with several week history of dysphagia with solids as well as pills. She has history of Schatzki's ring which was last dilated disrupted in May 2012. Patient also has diarrhea 4 weeks duration unresponsive to metronidazole and stool studies are pending. Impression: Esophageal web and prominent Schatzki's ring.Both of these were disrupted by passing 38 [place] dilator.  Small ulcer at distal esophagus extending into GE junction. Small sliding hiatal hernia. Two gastric ulcers at antrum measuring 4 and 6 mm. Ulcer towards lesser curvature was much deeper. It was biopsied for histology. Mark edema to prepyloric and pyloric channel mucosa.  Notes Recorded by Rogene Houston, MD on 10/01/2014 at 4:57 PM Biopsy results reviewed with patient. Esophageal biopsy negative for EOE and gastric biopsy reveals benign ulcer and no H. pylori Patient advised to refrain from using NSAIDs. She needs to see Dr. Quillian Quince for management of her headache/migraine. Patient advised to take fiber supplement 4 g bedtime and Imodium 2 mg by mouth bedtime. Office visit in 8 weeks.  She will need follow-up EGD to document complete healing of gastric ulcers. Report to PCP    Review of Systems Past Medical History  Diagnosis Date  . Hypertension   . Depression   . High cholesterol   . GERD (gastroesophageal reflux disease)   . Diarrhea     Past  Surgical History  Procedure Laterality Date  . Cholecystectomy    . Appendectomy    . Breast enhancement surgery    . Cesarean section    . Nasal sinus surgery    . Esophagogastroduodenoscopy N/A 09/27/2014    Procedure: ESOPHAGOGASTRODUODENOSCOPY (EGD);  Surgeon: Rogene Houston, MD;  Location: [facility];  Service: Endoscopy;  Laterality: N/A;  930  . Esophageal dilation N/A 09/27/2014    Procedure: ESOPHAGEAL DILATION;  Surgeon: Rogene Houston, MD;  Location: [facility];  Service: Endoscopy;  Laterality: N/A;    Allergies  Allergen Reactions  . Levaquin [Levofloxacin] Nausea And Vomiting  . Penicillins Rash    Current Outpatient Prescriptions on File Prior to Visit  Medication Sig Dispense Refill  . acetaminophen (TYLENOL) 500 MG tablet Take 500 mg by mouth every 6 (six) hours as needed for pain.    Marland Kitchen atorvastatin (LIPITOR) 40 MG tablet Take 40 mg by mouth daily.    Marland Kitchen dicyclomine (BENTYL) 10 MG capsule Take 1 capsule (10 mg total) by mouth 3 (three) times daily as needed for spasms. 90 capsule 1  . diphenhydrAMINE (BENADRYL) 25 mg capsule Take 25 mg by mouth every 6 (six) hours as needed for itching.    . escitalopram (LEXAPRO) 10 MG tablet Take 10 mg by mouth daily.    . pantoprazole (PROTONIX) 40 MG tablet Take 1 tablet (40 mg total) by mouth 2 (two) times daily before a meal. 60 tablet 3   No current facility-administered medications on file prior to visit.        Objective:   Physical Exam Blood pressure  102/52, pulse 64, temperature 98.1 F (36.7 C), height 5\' 2"  (1.575 m), weight 119 lb 4.8 oz (54.114 kg). Alert and oriented. Skin warm and dry. Oral mucosa is moist.   . Sclera anicteric, conjunctivae is pink. Thyroid not enlarged. No cervical lymphadenopathy. Lungs clear. Heart regular rate and rhythm.  Abdomen is soft. Bowel sounds are positive. No hepatomegaly. No abdominal masses felt. No tenderness.  No edema to lower extremities.         Assessment & Plan:   Hx of esophageal and gastric ulcer. She needs surveillance to be sure ulcers have healed. EGD.

## 2015-01-03 ENCOUNTER — Encounter (HOSPITAL_COMMUNITY)
Admission: RE | Disposition: A | Discharge status: Home / Self Care | Payer: Self-pay | Source: Ambulatory Visit | Attending: Internal Medicine

## 2015-01-03 ENCOUNTER — Ambulatory Visit (HOSPITAL_COMMUNITY)
Admission: RE | Admit: 2015-01-03 | Discharge: 2015-01-03 | Disposition: A | Discharge status: Home / Self Care | Payer: BC Managed Care – PPO | Source: Ambulatory Visit | Attending: Internal Medicine | Admitting: Internal Medicine

## 2015-01-03 ENCOUNTER — Encounter (HOSPITAL_COMMUNITY): Payer: Self-pay

## 2015-01-03 DIAGNOSIS — Z79899 Other long term (current) drug therapy: Secondary | ICD-10-CM | POA: Insufficient documentation

## 2015-01-03 DIAGNOSIS — K259 Gastric ulcer, unspecified as acute or chronic, without hemorrhage or perforation: Secondary | ICD-10-CM | POA: Diagnosis not present

## 2015-01-03 DIAGNOSIS — K2970 Gastritis, unspecified, without bleeding: Secondary | ICD-10-CM | POA: Diagnosis not present

## 2015-01-03 DIAGNOSIS — E780 Pure hypercholesterolemia: Secondary | ICD-10-CM | POA: Insufficient documentation

## 2015-01-03 DIAGNOSIS — K219 Gastro-esophageal reflux disease without esophagitis: Secondary | ICD-10-CM | POA: Insufficient documentation

## 2015-01-03 DIAGNOSIS — I10 Essential (primary) hypertension: Secondary | ICD-10-CM | POA: Insufficient documentation

## 2015-01-03 DIAGNOSIS — F329 Major depressive disorder, single episode, unspecified: Secondary | ICD-10-CM | POA: Diagnosis not present

## 2015-01-03 HISTORY — PX: ESOPHAGOGASTRODUODENOSCOPY: SHX5428

## 2015-01-03 SURGERY — EGD (ESOPHAGOGASTRODUODENOSCOPY)
Anesthesia: Moderate Sedation

## 2015-01-03 MED ORDER — BUTAMBEN-TETRACAINE-BENZOCAINE 2-2-14 % EX AERO
INHALATION_SPRAY | CUTANEOUS | Status: DC | PRN
  Administered 2015-01-03: 2 via TOPICAL

## 2015-01-03 MED ORDER — MIDAZOLAM HCL 5 MG/5ML IJ SOLN
INTRAMUSCULAR | Status: AC
  Filled 2015-01-03: qty 10

## 2015-01-03 MED ORDER — STERILE WATER FOR IRRIGATION IR SOLN
Status: DC | PRN
  Administered 2015-01-03: 15:00:00

## 2015-01-03 MED ORDER — MEPERIDINE HCL 50 MG/ML IJ SOLN
INTRAMUSCULAR | Status: DC | PRN
  Administered 2015-01-03 (×2): 25 mg via INTRAVENOUS

## 2015-01-03 MED ORDER — SODIUM CHLORIDE 0.9 % IV SOLN
INTRAVENOUS | Status: DC
  Administered 2015-01-03: 14:00:00 via INTRAVENOUS

## 2015-01-03 MED ORDER — MIDAZOLAM HCL 5 MG/5ML IJ SOLN
INTRAMUSCULAR | Status: DC | PRN
Start: 2015-01-03 — End: 2015-01-03
  Administered 2015-01-03: 2 mg via INTRAVENOUS
  Administered 2015-01-03: 1 mg via INTRAVENOUS

## 2015-01-03 MED ORDER — MEPERIDINE HCL 50 MG/ML IJ SOLN
INTRAMUSCULAR | Status: AC
  Filled 2015-01-03: qty 1

## 2015-01-03 NOTE — H&P (Signed)
Joanna Sutton is an 59 y.o. female.   Chief Complaint: Patient is here for EGD and possible ED. HPI: Patient is 59 year old Caucasian female who under an EGD with dilation 2 months ago when she presented with dysphagia. Schatzki's ring as well as esophageal web and evidence of reflux esophagitis into gastric ulcers. Biopsy from the large gastric ulcer revealed benign etiology and age but only stains were negative. Patient has stayed off NSAIDs and is on PPI. She is returning for follow-up exam to document complete healing of these gastric ulcers. She says she's not having epigastric pain anymore. She denies melena or rectal bleeding. She is not using OTC NSAIDs anymore.  Past Medical History  Diagnosis Date  . Hypertension   . Depression   . High cholesterol   . GERD (gastroesophageal reflux disease)   . Diarrhea     Past Surgical History  Procedure Laterality Date  . Cholecystectomy    . Appendectomy    . Breast enhancement surgery    . Cesarean section    . Nasal sinus surgery    . Esophagogastroduodenoscopy N/A 09/27/2014    Procedure: ESOPHAGOGASTRODUODENOSCOPY (EGD);  Surgeon: Rogene Houston, MD;  Location: [facility];  Service: Endoscopy;  Laterality: N/A;  930  . Esophageal dilation N/A 09/27/2014    Procedure: ESOPHAGEAL DILATION;  Surgeon: Rogene Houston, MD;  Location: [facility];  Service: Endoscopy;  Laterality: N/A;    History reviewed. No pertinent family history. Social History:  reports that she has never smoked. She does not have any smokeless tobacco history on file. She reports that she does not drink alcohol or use illicit drugs.  Allergies:  Allergies  Allergen Reactions  . Levaquin [Levofloxacin] Nausea And Vomiting  . Penicillins Rash    Medications Prior to Admission  Medication Sig Dispense Refill  . acetaminophen (TYLENOL) 500 MG tablet Take 500 mg by mouth every 6 (six) hours as needed for pain.    Marland Kitchen atorvastatin (LIPITOR) 40 MG tablet Take 40  mg by mouth daily.    Marland Kitchen dicyclomine (BENTYL) 10 MG capsule Take 1 capsule (10 mg total) by mouth 3 (three) times daily as needed for spasms. 90 capsule 1  . diphenhydrAMINE (BENADRYL) 25 mg capsule Take 25 mg by mouth every 6 (six) hours as needed for itching.    . escitalopram (LEXAPRO) 10 MG tablet Take 10 mg by mouth daily.    . pantoprazole (PROTONIX) 40 MG tablet Take 1 tablet (40 mg total) by mouth 2 (two) times daily before a meal. 60 tablet 3    No results found for this or any previous visit (from the past 48 hour(s)). No results found.  ROS  Blood pressure 108/52, pulse 61, temperature 98.4 F (36.9 C), temperature source Oral, resp. rate 16, height 5\' 2"  (1.575 m), weight 119 lb (53.978 kg), SpO2 97 %. Physical Exam  Constitutional: She appears well-developed and well-nourished.  HENT:  Mouth/Throat: Oropharynx is clear and moist.  Eyes: Conjunctivae are normal. No scleral icterus.  Neck: No thyromegaly present.  Cardiovascular: Normal rate, regular rhythm and normal heart sounds.   No murmur heard. Respiratory: Effort normal and breath sounds normal.  GI: Soft. She exhibits no distension and no mass. There is no tenderness.  Musculoskeletal: She exhibits no edema.  Lymphadenopathy:    She has no cervical adenopathy.  Neurological: She is alert.  Skin: Skin is warm and dry.     Assessment/Plan History of gastric ulcers. EGD to document  healing of peptic ulcer disease. Esophageal dilation if ring felt to be critical.  Joanna Sutton U 01/03/2015, 3:10 PM

## 2015-01-03 NOTE — Op Note (Addendum)
EGD PROCEDURE REPORT  PATIENT:  Joanna Sutton  MR#:  [id number] Birthdate:  03/07/1956, 59 y.o., female Endoscopist:  Dr. Rogene Houston, MD Referred By:  Dr. Glenda Chroman, MD  Procedure Date: 01/03/2015  Procedure:   EGD(ED not needed)  Indications:  Patient is 59 year old Caucasian female who underwent EGD with ED for 3 months skin was found to have 2 gastric ulcers. Biopsy revealed benign etiology and H. pylori stains were negative. Patient has been using OTC NSAIDs. She's been treated with double dose PPI. She denies using OTC NSAIDs. She says she is not having epigastric pain anymore. She also denies dysphagia.           Informed Consent:  The risks, benefits, alternatives & imponderables which include, but are not limited to, bleeding, infection, perforation, drug reaction and potential missed lesion have been reviewed.  The potential for biopsy, lesion removal, esophageal dilation, etc. have also been discussed.  Questions have been answered.  All parties agreeable.  Please see history & physical in medical record for more information.  Medications:  Demerol 50 mg IV Versed 3 mg IV Cetacaine spray topically for oropharyngeal anesthesia  Description of procedure:  The endoscope was introduced through the mouth and advanced to the second portion of the duodenum without difficulty or limitations. The mucosal surfaces were surveyed very carefully during advancement of the scope and upon withdrawal.  Findings:  Esophagus:  Mucosa of the esophagus was normal. GE junction was unremarkable without ring or stricture formation. GEJ:  34 cm Stomach:  Stomach was empty and distended very well with insufflation. Folds in the proximal stomach were normal. Examination mucosa at gastric body was normal. There was edema and erythema and granularity to prepyloric mucosa along with four ulcers. Two of these ulcers were 3 mm each. Other two were deep and measured 5 and 7 mm. Angularis fundus and cardia  were unremarkable. Duodenum:  Normal bulbar and post bulbar mucosa.  Therapeutic/Diagnostic Maneuvers Performed:  Multiple biopsies were taken from margin of two gastric ulcers and submitted separately.  Complications:  None   EBL: Minimal  Impression: Healed esophagitis and no evidence of recurrent Schatzki's ring. Gastritis with four prepyloric ulcers. On last exam she only had two ulcers. Multiple biopsies taken from margin of two of the larger gastric ulcers.  Comment: Nonhealing gastric ulcers most likely secondary to continued NSAID use.  Recommendations:  Standard instructions given.  Patient informed that she cannot use OTC NSAIDs. I will be contacting patient with biopsy results and further recommendations.   Joanna Sutton  01/03/2015  3:34 PM  CC: Dr. Glenda Chroman, MD

## 2015-01-03 NOTE — Discharge Instructions (Signed)
Remember you cannot take Advil or Aleve BC Goody powder or similar medications. Resume usual medications and diet. No driving for 24 hours. Physician will call with biopsy results and further recommendations.      Esophagogastroduodenoscopy Care After Refer to this sheet in the next few weeks. These instructions provide you with information on caring for yourself after your procedure. Your caregiver may also give you more specific instructions. Your treatment has been planned according to current medical practices, but problems sometimes occur. Call your caregiver if you have any problems or questions after your procedure.  HOME CARE INSTRUCTIONS  Do not eat or drink anything until the numbing medicine (local anesthetic) has worn off and your gag reflex has returned. You will know that the local anesthetic has worn off when you can swallow comfortably.  Do not drive for 12 hours after the procedure or as directed by your caregiver.  Only take medicines as directed by your caregiver. SEEK MEDICAL CARE IF:   You cannot stop coughing.  You are not urinating at all or less than usual. SEEK IMMEDIATE MEDICAL CARE IF:  You have difficulty swallowing.  You cannot eat or drink.  You have worsening throat or chest pain.  You have dizziness, lightheadedness, or you faint.  You have nausea or vomiting.  You have chills.  You have a fever.  You have severe abdominal pain.  You have black, tarry, or bloody stools. Document Released: 03/09/2012 Document Reviewed: 03/09/2012 [facility] Patient Information 2015 [place]. This information is not intended to replace advice given to you by your health care provider. Make sure you discuss any questions you have with your health care provider.

## 2015-01-08 ENCOUNTER — Encounter (HOSPITAL_COMMUNITY): Payer: Self-pay | Admitting: Internal Medicine

## 2015-04-22 ENCOUNTER — Encounter (INDEPENDENT_AMBULATORY_CARE_PROVIDER_SITE_OTHER): Payer: Self-pay | Admitting: *Deleted

## 2017-10-20 ENCOUNTER — Encounter (INDEPENDENT_AMBULATORY_CARE_PROVIDER_SITE_OTHER): Payer: BC Managed Care – PPO | Admitting: Ophthalmology

## 2017-10-20 DIAGNOSIS — H43813 Vitreous degeneration, bilateral: Secondary | ICD-10-CM | POA: Diagnosis not present

## 2017-10-20 DIAGNOSIS — H2513 Age-related nuclear cataract, bilateral: Secondary | ICD-10-CM | POA: Diagnosis not present

## 2017-12-21 ENCOUNTER — Encounter (INDEPENDENT_AMBULATORY_CARE_PROVIDER_SITE_OTHER): Payer: BC Managed Care – PPO | Admitting: Ophthalmology

## 2018-12-21 ENCOUNTER — Other Ambulatory Visit: Payer: Self-pay

## 2018-12-21 DIAGNOSIS — U071 COVID-19: Secondary | ICD-10-CM

## 2018-12-22 LAB — NOVEL CORONAVIRUS, NAA: SARS-CoV-2, NAA: DETECTED — AB

## 2019-02-16 DIAGNOSIS — Z6827 Body mass index (BMI) 27.0-27.9, adult: Secondary | ICD-10-CM | POA: Diagnosis not present

## 2019-02-16 DIAGNOSIS — Z299 Encounter for prophylactic measures, unspecified: Secondary | ICD-10-CM | POA: Diagnosis not present

## 2019-02-16 DIAGNOSIS — R0989 Other specified symptoms and signs involving the circulatory and respiratory systems: Secondary | ICD-10-CM | POA: Diagnosis not present

## 2019-02-16 DIAGNOSIS — Z713 Dietary counseling and surveillance: Secondary | ICD-10-CM | POA: Diagnosis not present

## 2019-02-17 DIAGNOSIS — M25562 Pain in left knee: Secondary | ICD-10-CM | POA: Diagnosis not present

## 2019-02-17 DIAGNOSIS — M1712 Unilateral primary osteoarthritis, left knee: Secondary | ICD-10-CM | POA: Diagnosis not present

## 2019-04-18 DIAGNOSIS — R5383 Other fatigue: Secondary | ICD-10-CM | POA: Diagnosis not present

## 2019-04-18 DIAGNOSIS — Z7189 Other specified counseling: Secondary | ICD-10-CM | POA: Diagnosis not present

## 2019-04-18 DIAGNOSIS — F322 Major depressive disorder, single episode, severe without psychotic features: Secondary | ICD-10-CM | POA: Diagnosis not present

## 2019-04-18 DIAGNOSIS — Z79899 Other long term (current) drug therapy: Secondary | ICD-10-CM | POA: Diagnosis not present

## 2019-04-18 DIAGNOSIS — Z1211 Encounter for screening for malignant neoplasm of colon: Secondary | ICD-10-CM | POA: Diagnosis not present

## 2019-04-18 DIAGNOSIS — Z1331 Encounter for screening for depression: Secondary | ICD-10-CM | POA: Diagnosis not present

## 2019-04-18 DIAGNOSIS — Z299 Encounter for prophylactic measures, unspecified: Secondary | ICD-10-CM | POA: Diagnosis not present

## 2019-04-18 DIAGNOSIS — Z1339 Encounter for screening examination for other mental health and behavioral disorders: Secondary | ICD-10-CM | POA: Diagnosis not present

## 2019-04-18 DIAGNOSIS — Z0000 Encounter for general adult medical examination without abnormal findings: Secondary | ICD-10-CM | POA: Diagnosis not present

## 2019-04-18 DIAGNOSIS — E7800 Pure hypercholesterolemia, unspecified: Secondary | ICD-10-CM | POA: Diagnosis not present

## 2019-04-18 DIAGNOSIS — R0989 Other specified symptoms and signs involving the circulatory and respiratory systems: Secondary | ICD-10-CM | POA: Diagnosis not present

## 2019-04-18 DIAGNOSIS — K219 Gastro-esophageal reflux disease without esophagitis: Secondary | ICD-10-CM | POA: Diagnosis not present

## 2019-04-18 DIAGNOSIS — Z6827 Body mass index (BMI) 27.0-27.9, adult: Secondary | ICD-10-CM | POA: Diagnosis not present

## 2019-04-19 DIAGNOSIS — L0100 Impetigo, unspecified: Secondary | ICD-10-CM | POA: Diagnosis not present

## 2019-04-19 DIAGNOSIS — L570 Actinic keratosis: Secondary | ICD-10-CM | POA: Diagnosis not present

## 2019-04-19 DIAGNOSIS — L309 Dermatitis, unspecified: Secondary | ICD-10-CM | POA: Diagnosis not present

## 2019-05-10 DIAGNOSIS — L309 Dermatitis, unspecified: Secondary | ICD-10-CM | POA: Diagnosis not present

## 2019-05-10 DIAGNOSIS — L732 Hidradenitis suppurativa: Secondary | ICD-10-CM | POA: Diagnosis not present

## 2019-05-15 DIAGNOSIS — K1329 Other disturbances of oral epithelium, including tongue: Secondary | ICD-10-CM | POA: Diagnosis not present

## 2019-06-08 DIAGNOSIS — Z6827 Body mass index (BMI) 27.0-27.9, adult: Secondary | ICD-10-CM | POA: Diagnosis not present

## 2019-06-08 DIAGNOSIS — Z299 Encounter for prophylactic measures, unspecified: Secondary | ICD-10-CM | POA: Diagnosis not present

## 2019-06-08 DIAGNOSIS — M810 Age-related osteoporosis without current pathological fracture: Secondary | ICD-10-CM | POA: Diagnosis not present

## 2019-06-08 DIAGNOSIS — F322 Major depressive disorder, single episode, severe without psychotic features: Secondary | ICD-10-CM | POA: Diagnosis not present

## 2019-06-08 DIAGNOSIS — I10 Essential (primary) hypertension: Secondary | ICD-10-CM | POA: Diagnosis not present

## 2019-06-08 DIAGNOSIS — E7800 Pure hypercholesterolemia, unspecified: Secondary | ICD-10-CM | POA: Diagnosis not present

## 2019-07-05 DIAGNOSIS — F410 Panic disorder [episodic paroxysmal anxiety] without agoraphobia: Secondary | ICD-10-CM | POA: Diagnosis not present

## 2019-07-05 DIAGNOSIS — E7800 Pure hypercholesterolemia, unspecified: Secondary | ICD-10-CM | POA: Diagnosis not present

## 2019-10-04 DIAGNOSIS — I10 Essential (primary) hypertension: Secondary | ICD-10-CM | POA: Diagnosis not present

## 2019-10-04 DIAGNOSIS — E785 Hyperlipidemia, unspecified: Secondary | ICD-10-CM | POA: Diagnosis not present

## 2019-10-04 DIAGNOSIS — K219 Gastro-esophageal reflux disease without esophagitis: Secondary | ICD-10-CM | POA: Diagnosis not present

## 2019-10-04 DIAGNOSIS — M810 Age-related osteoporosis without current pathological fracture: Secondary | ICD-10-CM | POA: Diagnosis not present

## 2019-11-07 DIAGNOSIS — Z299 Encounter for prophylactic measures, unspecified: Secondary | ICD-10-CM | POA: Diagnosis not present

## 2019-11-07 DIAGNOSIS — F322 Major depressive disorder, single episode, severe without psychotic features: Secondary | ICD-10-CM | POA: Diagnosis not present

## 2019-11-07 DIAGNOSIS — I10 Essential (primary) hypertension: Secondary | ICD-10-CM | POA: Diagnosis not present

## 2019-11-07 DIAGNOSIS — E7800 Pure hypercholesterolemia, unspecified: Secondary | ICD-10-CM | POA: Diagnosis not present

## 2019-11-10 DIAGNOSIS — Z299 Encounter for prophylactic measures, unspecified: Secondary | ICD-10-CM | POA: Diagnosis not present

## 2019-11-10 DIAGNOSIS — F322 Major depressive disorder, single episode, severe without psychotic features: Secondary | ICD-10-CM | POA: Diagnosis not present

## 2019-11-10 DIAGNOSIS — I10 Essential (primary) hypertension: Secondary | ICD-10-CM | POA: Diagnosis not present

## 2019-12-05 DIAGNOSIS — I10 Essential (primary) hypertension: Secondary | ICD-10-CM | POA: Diagnosis not present

## 2020-01-04 DIAGNOSIS — K219 Gastro-esophageal reflux disease without esophagitis: Secondary | ICD-10-CM | POA: Diagnosis not present

## 2020-01-04 DIAGNOSIS — I10 Essential (primary) hypertension: Secondary | ICD-10-CM | POA: Diagnosis not present

## 2020-01-04 DIAGNOSIS — E785 Hyperlipidemia, unspecified: Secondary | ICD-10-CM | POA: Diagnosis not present

## 2020-01-04 DIAGNOSIS — M810 Age-related osteoporosis without current pathological fracture: Secondary | ICD-10-CM | POA: Diagnosis not present

## 2020-01-15 DIAGNOSIS — L309 Dermatitis, unspecified: Secondary | ICD-10-CM | POA: Diagnosis not present

## 2020-01-15 DIAGNOSIS — L0100 Impetigo, unspecified: Secondary | ICD-10-CM | POA: Diagnosis not present

## 2020-01-24 DIAGNOSIS — L0100 Impetigo, unspecified: Secondary | ICD-10-CM | POA: Diagnosis not present

## 2020-01-31 DIAGNOSIS — B370 Candidal stomatitis: Secondary | ICD-10-CM | POA: Diagnosis not present

## 2020-01-31 DIAGNOSIS — Z299 Encounter for prophylactic measures, unspecified: Secondary | ICD-10-CM | POA: Diagnosis not present

## 2020-02-02 DIAGNOSIS — I10 Essential (primary) hypertension: Secondary | ICD-10-CM | POA: Diagnosis not present

## 2020-02-02 DIAGNOSIS — E785 Hyperlipidemia, unspecified: Secondary | ICD-10-CM | POA: Diagnosis not present

## 2020-02-02 DIAGNOSIS — K219 Gastro-esophageal reflux disease without esophagitis: Secondary | ICD-10-CM | POA: Diagnosis not present

## 2020-02-02 DIAGNOSIS — M810 Age-related osteoporosis without current pathological fracture: Secondary | ICD-10-CM | POA: Diagnosis not present

## 2020-02-03 DIAGNOSIS — I10 Essential (primary) hypertension: Secondary | ICD-10-CM | POA: Diagnosis not present

## 2020-02-05 DIAGNOSIS — L0100 Impetigo, unspecified: Secondary | ICD-10-CM | POA: Diagnosis not present

## 2020-02-07 DIAGNOSIS — I10 Essential (primary) hypertension: Secondary | ICD-10-CM | POA: Diagnosis not present

## 2020-02-07 DIAGNOSIS — F322 Major depressive disorder, single episode, severe without psychotic features: Secondary | ICD-10-CM | POA: Diagnosis not present

## 2020-02-07 DIAGNOSIS — Z23 Encounter for immunization: Secondary | ICD-10-CM | POA: Diagnosis not present

## 2020-02-07 DIAGNOSIS — Z299 Encounter for prophylactic measures, unspecified: Secondary | ICD-10-CM | POA: Diagnosis not present

## 2020-03-05 DIAGNOSIS — I10 Essential (primary) hypertension: Secondary | ICD-10-CM | POA: Diagnosis not present

## 2020-03-28 DIAGNOSIS — F322 Major depressive disorder, single episode, severe without psychotic features: Secondary | ICD-10-CM | POA: Diagnosis not present

## 2020-03-28 DIAGNOSIS — Z299 Encounter for prophylactic measures, unspecified: Secondary | ICD-10-CM | POA: Diagnosis not present

## 2020-03-28 DIAGNOSIS — R300 Dysuria: Secondary | ICD-10-CM | POA: Diagnosis not present

## 2020-03-28 DIAGNOSIS — N390 Urinary tract infection, site not specified: Secondary | ICD-10-CM | POA: Diagnosis not present

## 2020-03-28 DIAGNOSIS — I10 Essential (primary) hypertension: Secondary | ICD-10-CM | POA: Diagnosis not present

## 2020-04-04 DIAGNOSIS — I10 Essential (primary) hypertension: Secondary | ICD-10-CM | POA: Diagnosis not present

## 2020-04-05 DIAGNOSIS — I10 Essential (primary) hypertension: Secondary | ICD-10-CM | POA: Diagnosis not present

## 2020-04-05 DIAGNOSIS — M810 Age-related osteoporosis without current pathological fracture: Secondary | ICD-10-CM | POA: Diagnosis not present

## 2020-04-05 DIAGNOSIS — E785 Hyperlipidemia, unspecified: Secondary | ICD-10-CM | POA: Diagnosis not present

## 2020-05-02 DIAGNOSIS — Z1339 Encounter for screening examination for other mental health and behavioral disorders: Secondary | ICD-10-CM | POA: Diagnosis not present

## 2020-05-02 DIAGNOSIS — I10 Essential (primary) hypertension: Secondary | ICD-10-CM | POA: Diagnosis not present

## 2020-05-02 DIAGNOSIS — Z1331 Encounter for screening for depression: Secondary | ICD-10-CM | POA: Diagnosis not present

## 2020-05-02 DIAGNOSIS — E7800 Pure hypercholesterolemia, unspecified: Secondary | ICD-10-CM | POA: Diagnosis not present

## 2020-05-02 DIAGNOSIS — Z0000 Encounter for general adult medical examination without abnormal findings: Secondary | ICD-10-CM | POA: Diagnosis not present

## 2020-05-02 DIAGNOSIS — R5383 Other fatigue: Secondary | ICD-10-CM | POA: Diagnosis not present

## 2020-05-02 DIAGNOSIS — Z6826 Body mass index (BMI) 26.0-26.9, adult: Secondary | ICD-10-CM | POA: Diagnosis not present

## 2020-05-02 DIAGNOSIS — Z7189 Other specified counseling: Secondary | ICD-10-CM | POA: Diagnosis not present

## 2020-05-02 DIAGNOSIS — Z299 Encounter for prophylactic measures, unspecified: Secondary | ICD-10-CM | POA: Diagnosis not present

## 2020-05-03 DIAGNOSIS — E7800 Pure hypercholesterolemia, unspecified: Secondary | ICD-10-CM | POA: Diagnosis not present

## 2020-05-03 DIAGNOSIS — R5383 Other fatigue: Secondary | ICD-10-CM | POA: Diagnosis not present

## 2020-05-03 DIAGNOSIS — Z79899 Other long term (current) drug therapy: Secondary | ICD-10-CM | POA: Diagnosis not present

## 2020-05-06 DIAGNOSIS — I10 Essential (primary) hypertension: Secondary | ICD-10-CM | POA: Diagnosis not present

## 2020-05-06 DIAGNOSIS — M810 Age-related osteoporosis without current pathological fracture: Secondary | ICD-10-CM | POA: Diagnosis not present

## 2020-05-06 DIAGNOSIS — E785 Hyperlipidemia, unspecified: Secondary | ICD-10-CM | POA: Diagnosis not present

## 2020-06-03 DIAGNOSIS — I10 Essential (primary) hypertension: Secondary | ICD-10-CM | POA: Diagnosis not present

## 2020-07-03 DIAGNOSIS — E7800 Pure hypercholesterolemia, unspecified: Secondary | ICD-10-CM | POA: Diagnosis not present

## 2020-07-03 DIAGNOSIS — E785 Hyperlipidemia, unspecified: Secondary | ICD-10-CM | POA: Diagnosis not present

## 2020-07-03 DIAGNOSIS — I10 Essential (primary) hypertension: Secondary | ICD-10-CM | POA: Diagnosis not present

## 2020-07-04 DIAGNOSIS — I10 Essential (primary) hypertension: Secondary | ICD-10-CM | POA: Diagnosis not present

## 2020-07-29 DIAGNOSIS — J069 Acute upper respiratory infection, unspecified: Secondary | ICD-10-CM | POA: Diagnosis not present

## 2020-07-29 DIAGNOSIS — Z299 Encounter for prophylactic measures, unspecified: Secondary | ICD-10-CM | POA: Diagnosis not present

## 2020-08-02 DIAGNOSIS — I10 Essential (primary) hypertension: Secondary | ICD-10-CM | POA: Diagnosis not present

## 2020-08-03 DIAGNOSIS — I10 Essential (primary) hypertension: Secondary | ICD-10-CM | POA: Diagnosis not present

## 2020-08-03 DIAGNOSIS — E559 Vitamin D deficiency, unspecified: Secondary | ICD-10-CM | POA: Diagnosis not present

## 2020-08-03 DIAGNOSIS — E785 Hyperlipidemia, unspecified: Secondary | ICD-10-CM | POA: Diagnosis not present

## 2020-08-12 DIAGNOSIS — J40 Bronchitis, not specified as acute or chronic: Secondary | ICD-10-CM | POA: Diagnosis not present

## 2020-08-12 DIAGNOSIS — F339 Major depressive disorder, recurrent, unspecified: Secondary | ICD-10-CM | POA: Diagnosis not present

## 2020-08-12 DIAGNOSIS — I10 Essential (primary) hypertension: Secondary | ICD-10-CM | POA: Diagnosis not present

## 2020-08-12 DIAGNOSIS — N6489 Other specified disorders of breast: Secondary | ICD-10-CM | POA: Diagnosis not present

## 2020-08-12 DIAGNOSIS — Z299 Encounter for prophylactic measures, unspecified: Secondary | ICD-10-CM | POA: Diagnosis not present

## 2020-09-03 DIAGNOSIS — I10 Essential (primary) hypertension: Secondary | ICD-10-CM | POA: Diagnosis not present

## 2020-09-12 DIAGNOSIS — Z87891 Personal history of nicotine dependence: Secondary | ICD-10-CM | POA: Diagnosis not present

## 2020-09-12 DIAGNOSIS — Z299 Encounter for prophylactic measures, unspecified: Secondary | ICD-10-CM | POA: Diagnosis not present

## 2020-09-12 DIAGNOSIS — I10 Essential (primary) hypertension: Secondary | ICD-10-CM | POA: Diagnosis not present

## 2020-09-14 DIAGNOSIS — S32058A Other fracture of fifth lumbar vertebra, initial encounter for closed fracture: Secondary | ICD-10-CM | POA: Diagnosis not present

## 2020-09-14 DIAGNOSIS — J9811 Atelectasis: Secondary | ICD-10-CM | POA: Diagnosis not present

## 2020-09-14 DIAGNOSIS — Z043 Encounter for examination and observation following other accident: Secondary | ICD-10-CM | POA: Diagnosis not present

## 2020-09-14 DIAGNOSIS — R4182 Altered mental status, unspecified: Secondary | ICD-10-CM | POA: Diagnosis not present

## 2020-09-14 DIAGNOSIS — W19XXXA Unspecified fall, initial encounter: Secondary | ICD-10-CM | POA: Diagnosis not present

## 2020-09-14 DIAGNOSIS — R031 Nonspecific low blood-pressure reading: Secondary | ICD-10-CM | POA: Diagnosis not present

## 2020-09-14 DIAGNOSIS — S32038A Other fracture of third lumbar vertebra, initial encounter for closed fracture: Secondary | ICD-10-CM | POA: Diagnosis not present

## 2020-09-14 DIAGNOSIS — R519 Headache, unspecified: Secondary | ICD-10-CM | POA: Diagnosis not present

## 2020-09-14 DIAGNOSIS — Y998 Other external cause status: Secondary | ICD-10-CM | POA: Diagnosis not present

## 2020-09-14 DIAGNOSIS — S3210XA Unspecified fracture of sacrum, initial encounter for closed fracture: Secondary | ICD-10-CM | POA: Diagnosis not present

## 2020-09-14 DIAGNOSIS — R4020 Unspecified coma: Secondary | ICD-10-CM | POA: Diagnosis not present

## 2020-09-14 DIAGNOSIS — W010XXA Fall on same level from slipping, tripping and stumbling without subsequent striking against object, initial encounter: Secondary | ICD-10-CM | POA: Diagnosis not present

## 2020-09-14 DIAGNOSIS — R410 Disorientation, unspecified: Secondary | ICD-10-CM | POA: Diagnosis not present

## 2020-09-14 DIAGNOSIS — S0990XA Unspecified injury of head, initial encounter: Secondary | ICD-10-CM | POA: Diagnosis not present

## 2020-09-14 DIAGNOSIS — R0989 Other specified symptoms and signs involving the circulatory and respiratory systems: Secondary | ICD-10-CM | POA: Diagnosis not present

## 2020-09-14 DIAGNOSIS — S3219XA Other fracture of sacrum, initial encounter for closed fracture: Secondary | ICD-10-CM | POA: Diagnosis not present

## 2020-09-14 DIAGNOSIS — W228XXA Striking against or struck by other objects, initial encounter: Secondary | ICD-10-CM | POA: Diagnosis not present

## 2020-09-14 DIAGNOSIS — S32028A Other fracture of second lumbar vertebra, initial encounter for closed fracture: Secondary | ICD-10-CM | POA: Diagnosis not present

## 2020-09-14 DIAGNOSIS — R590 Localized enlarged lymph nodes: Secondary | ICD-10-CM | POA: Diagnosis not present

## 2020-09-14 DIAGNOSIS — S22048A Other fracture of fourth thoracic vertebra, initial encounter for closed fracture: Secondary | ICD-10-CM | POA: Diagnosis not present

## 2020-09-14 DIAGNOSIS — S22058A Other fracture of T5-T6 vertebra, initial encounter for closed fracture: Secondary | ICD-10-CM | POA: Diagnosis not present

## 2020-09-14 DIAGNOSIS — R55 Syncope and collapse: Secondary | ICD-10-CM | POA: Diagnosis not present

## 2020-09-15 DIAGNOSIS — S22048A Other fracture of fourth thoracic vertebra, initial encounter for closed fracture: Secondary | ICD-10-CM | POA: Diagnosis not present

## 2020-09-15 DIAGNOSIS — S3210XA Unspecified fracture of sacrum, initial encounter for closed fracture: Secondary | ICD-10-CM | POA: Diagnosis not present

## 2020-09-15 DIAGNOSIS — S32028A Other fracture of second lumbar vertebra, initial encounter for closed fracture: Secondary | ICD-10-CM | POA: Diagnosis not present

## 2020-09-15 DIAGNOSIS — S32058A Other fracture of fifth lumbar vertebra, initial encounter for closed fracture: Secondary | ICD-10-CM | POA: Diagnosis not present

## 2020-09-15 DIAGNOSIS — S22058A Other fracture of T5-T6 vertebra, initial encounter for closed fracture: Secondary | ICD-10-CM | POA: Diagnosis not present

## 2020-09-15 DIAGNOSIS — S32038A Other fracture of third lumbar vertebra, initial encounter for closed fracture: Secondary | ICD-10-CM | POA: Diagnosis not present

## 2020-09-17 DIAGNOSIS — I10 Essential (primary) hypertension: Secondary | ICD-10-CM | POA: Diagnosis not present

## 2020-09-17 DIAGNOSIS — S20219A Contusion of unspecified front wall of thorax, initial encounter: Secondary | ICD-10-CM | POA: Diagnosis not present

## 2020-09-17 DIAGNOSIS — W19XXXA Unspecified fall, initial encounter: Secondary | ICD-10-CM | POA: Diagnosis not present

## 2020-09-17 DIAGNOSIS — Z299 Encounter for prophylactic measures, unspecified: Secondary | ICD-10-CM | POA: Diagnosis not present

## 2020-10-03 DIAGNOSIS — I10 Essential (primary) hypertension: Secondary | ICD-10-CM | POA: Diagnosis not present

## 2020-10-03 DIAGNOSIS — E559 Vitamin D deficiency, unspecified: Secondary | ICD-10-CM | POA: Diagnosis not present

## 2020-10-03 DIAGNOSIS — E785 Hyperlipidemia, unspecified: Secondary | ICD-10-CM | POA: Diagnosis not present

## 2020-11-01 DIAGNOSIS — I10 Essential (primary) hypertension: Secondary | ICD-10-CM | POA: Diagnosis not present

## 2020-11-25 DIAGNOSIS — M1711 Unilateral primary osteoarthritis, right knee: Secondary | ICD-10-CM | POA: Diagnosis not present

## 2020-11-25 DIAGNOSIS — M25552 Pain in left hip: Secondary | ICD-10-CM | POA: Diagnosis not present

## 2020-11-25 DIAGNOSIS — M1712 Unilateral primary osteoarthritis, left knee: Secondary | ICD-10-CM | POA: Diagnosis not present

## 2020-12-04 DIAGNOSIS — E559 Vitamin D deficiency, unspecified: Secondary | ICD-10-CM | POA: Diagnosis not present

## 2020-12-04 DIAGNOSIS — E785 Hyperlipidemia, unspecified: Secondary | ICD-10-CM | POA: Diagnosis not present

## 2020-12-04 DIAGNOSIS — I10 Essential (primary) hypertension: Secondary | ICD-10-CM | POA: Diagnosis not present

## 2020-12-05 DIAGNOSIS — M1712 Unilateral primary osteoarthritis, left knee: Secondary | ICD-10-CM | POA: Diagnosis not present

## 2020-12-12 DIAGNOSIS — M1712 Unilateral primary osteoarthritis, left knee: Secondary | ICD-10-CM | POA: Diagnosis not present

## 2021-01-03 DIAGNOSIS — I10 Essential (primary) hypertension: Secondary | ICD-10-CM | POA: Diagnosis not present

## 2021-01-13 DIAGNOSIS — Z299 Encounter for prophylactic measures, unspecified: Secondary | ICD-10-CM | POA: Diagnosis not present

## 2021-01-13 DIAGNOSIS — L980 Pyogenic granuloma: Secondary | ICD-10-CM | POA: Diagnosis not present

## 2021-01-13 DIAGNOSIS — Z23 Encounter for immunization: Secondary | ICD-10-CM | POA: Diagnosis not present

## 2021-01-13 DIAGNOSIS — N6489 Other specified disorders of breast: Secondary | ICD-10-CM | POA: Diagnosis not present

## 2021-01-13 DIAGNOSIS — I10 Essential (primary) hypertension: Secondary | ICD-10-CM | POA: Diagnosis not present

## 2021-01-13 DIAGNOSIS — C4442 Squamous cell carcinoma of skin of scalp and neck: Secondary | ICD-10-CM | POA: Diagnosis not present

## 2021-01-13 DIAGNOSIS — M810 Age-related osteoporosis without current pathological fracture: Secondary | ICD-10-CM | POA: Diagnosis not present

## 2021-01-13 DIAGNOSIS — D485 Neoplasm of uncertain behavior of skin: Secondary | ICD-10-CM | POA: Diagnosis not present

## 2021-02-03 DIAGNOSIS — I10 Essential (primary) hypertension: Secondary | ICD-10-CM | POA: Diagnosis not present

## 2021-02-05 ENCOUNTER — Other Ambulatory Visit: Payer: Self-pay | Admitting: Internal Medicine

## 2021-02-05 DIAGNOSIS — Z139 Encounter for screening, unspecified: Secondary | ICD-10-CM

## 2021-02-11 ENCOUNTER — Other Ambulatory Visit: Payer: Self-pay | Admitting: Internal Medicine

## 2021-02-11 ENCOUNTER — Ambulatory Visit
Admission: RE | Admit: 2021-02-11 | Discharge: 2021-02-11 | Disposition: A | Discharge status: Home / Self Care | Payer: PPO | Source: Ambulatory Visit | Attending: Internal Medicine | Admitting: Internal Medicine

## 2021-02-11 ENCOUNTER — Other Ambulatory Visit: Payer: Self-pay

## 2021-02-11 DIAGNOSIS — Z139 Encounter for screening, unspecified: Secondary | ICD-10-CM

## 2021-02-11 DIAGNOSIS — Z1231 Encounter for screening mammogram for malignant neoplasm of breast: Secondary | ICD-10-CM | POA: Diagnosis not present

## 2021-02-17 DIAGNOSIS — Z299 Encounter for prophylactic measures, unspecified: Secondary | ICD-10-CM | POA: Diagnosis not present

## 2021-02-17 DIAGNOSIS — C4492 Squamous cell carcinoma of skin, unspecified: Secondary | ICD-10-CM | POA: Diagnosis not present

## 2021-02-17 DIAGNOSIS — I10 Essential (primary) hypertension: Secondary | ICD-10-CM | POA: Diagnosis not present

## 2021-02-25 DIAGNOSIS — Z299 Encounter for prophylactic measures, unspecified: Secondary | ICD-10-CM | POA: Diagnosis not present

## 2021-02-25 DIAGNOSIS — Z87891 Personal history of nicotine dependence: Secondary | ICD-10-CM | POA: Diagnosis not present

## 2021-02-25 DIAGNOSIS — R6889 Other general symptoms and signs: Secondary | ICD-10-CM | POA: Diagnosis not present

## 2021-02-25 DIAGNOSIS — I10 Essential (primary) hypertension: Secondary | ICD-10-CM | POA: Diagnosis not present

## 2021-03-05 DIAGNOSIS — I10 Essential (primary) hypertension: Secondary | ICD-10-CM | POA: Diagnosis not present

## 2021-04-04 DIAGNOSIS — E7800 Pure hypercholesterolemia, unspecified: Secondary | ICD-10-CM | POA: Diagnosis not present

## 2021-04-04 DIAGNOSIS — K219 Gastro-esophageal reflux disease without esophagitis: Secondary | ICD-10-CM | POA: Diagnosis not present

## 2021-04-04 DIAGNOSIS — R600 Localized edema: Secondary | ICD-10-CM | POA: Diagnosis not present

## 2021-04-04 DIAGNOSIS — I10 Essential (primary) hypertension: Secondary | ICD-10-CM | POA: Diagnosis not present

## 2021-04-08 DIAGNOSIS — F339 Major depressive disorder, recurrent, unspecified: Secondary | ICD-10-CM | POA: Diagnosis not present

## 2021-04-08 DIAGNOSIS — R059 Cough, unspecified: Secondary | ICD-10-CM | POA: Diagnosis not present

## 2021-04-08 DIAGNOSIS — Z299 Encounter for prophylactic measures, unspecified: Secondary | ICD-10-CM | POA: Diagnosis not present

## 2021-04-08 DIAGNOSIS — J069 Acute upper respiratory infection, unspecified: Secondary | ICD-10-CM | POA: Diagnosis not present

## 2021-04-23 DIAGNOSIS — Z299 Encounter for prophylactic measures, unspecified: Secondary | ICD-10-CM | POA: Diagnosis not present

## 2021-04-23 DIAGNOSIS — R5383 Other fatigue: Secondary | ICD-10-CM | POA: Diagnosis not present

## 2021-04-23 DIAGNOSIS — I10 Essential (primary) hypertension: Secondary | ICD-10-CM | POA: Diagnosis not present

## 2021-04-23 DIAGNOSIS — Z1339 Encounter for screening examination for other mental health and behavioral disorders: Secondary | ICD-10-CM | POA: Diagnosis not present

## 2021-04-23 DIAGNOSIS — F339 Major depressive disorder, recurrent, unspecified: Secondary | ICD-10-CM | POA: Diagnosis not present

## 2021-04-23 DIAGNOSIS — Z0000 Encounter for general adult medical examination without abnormal findings: Secondary | ICD-10-CM | POA: Diagnosis not present

## 2021-04-23 DIAGNOSIS — E7800 Pure hypercholesterolemia, unspecified: Secondary | ICD-10-CM | POA: Diagnosis not present

## 2021-04-23 DIAGNOSIS — Z6825 Body mass index (BMI) 25.0-25.9, adult: Secondary | ICD-10-CM | POA: Diagnosis not present

## 2021-04-23 DIAGNOSIS — Z79899 Other long term (current) drug therapy: Secondary | ICD-10-CM | POA: Diagnosis not present

## 2021-04-23 DIAGNOSIS — Z1331 Encounter for screening for depression: Secondary | ICD-10-CM | POA: Diagnosis not present

## 2021-04-23 DIAGNOSIS — Z7189 Other specified counseling: Secondary | ICD-10-CM | POA: Diagnosis not present

## 2021-05-04 DIAGNOSIS — I10 Essential (primary) hypertension: Secondary | ICD-10-CM | POA: Diagnosis not present

## 2021-06-03 DIAGNOSIS — I10 Essential (primary) hypertension: Secondary | ICD-10-CM | POA: Diagnosis not present

## 2021-06-13 DIAGNOSIS — F410 Panic disorder [episodic paroxysmal anxiety] without agoraphobia: Secondary | ICD-10-CM | POA: Diagnosis not present

## 2021-06-13 DIAGNOSIS — Z299 Encounter for prophylactic measures, unspecified: Secondary | ICD-10-CM | POA: Diagnosis not present

## 2021-06-13 DIAGNOSIS — J329 Chronic sinusitis, unspecified: Secondary | ICD-10-CM | POA: Diagnosis not present

## 2021-07-03 DIAGNOSIS — I10 Essential (primary) hypertension: Secondary | ICD-10-CM | POA: Diagnosis not present

## 2021-08-03 DIAGNOSIS — I10 Essential (primary) hypertension: Secondary | ICD-10-CM | POA: Diagnosis not present

## 2021-08-18 DIAGNOSIS — E7800 Pure hypercholesterolemia, unspecified: Secondary | ICD-10-CM | POA: Diagnosis not present

## 2021-08-18 DIAGNOSIS — R079 Chest pain, unspecified: Secondary | ICD-10-CM | POA: Diagnosis not present

## 2021-08-18 DIAGNOSIS — Z299 Encounter for prophylactic measures, unspecified: Secondary | ICD-10-CM | POA: Diagnosis not present

## 2021-08-18 DIAGNOSIS — I10 Essential (primary) hypertension: Secondary | ICD-10-CM | POA: Diagnosis not present

## 2021-08-18 DIAGNOSIS — Z789 Other specified health status: Secondary | ICD-10-CM | POA: Diagnosis not present

## 2021-08-18 DIAGNOSIS — F339 Major depressive disorder, recurrent, unspecified: Secondary | ICD-10-CM | POA: Diagnosis not present

## 2021-08-21 DIAGNOSIS — L578 Other skin changes due to chronic exposure to nonionizing radiation: Secondary | ICD-10-CM | POA: Diagnosis not present

## 2021-08-21 DIAGNOSIS — L400 Psoriasis vulgaris: Secondary | ICD-10-CM | POA: Diagnosis not present

## 2021-09-02 DIAGNOSIS — I10 Essential (primary) hypertension: Secondary | ICD-10-CM | POA: Diagnosis not present

## 2021-09-05 DIAGNOSIS — Z1211 Encounter for screening for malignant neoplasm of colon: Secondary | ICD-10-CM | POA: Diagnosis not present

## 2021-09-05 DIAGNOSIS — Z1212 Encounter for screening for malignant neoplasm of rectum: Secondary | ICD-10-CM | POA: Diagnosis not present

## 2021-09-08 DIAGNOSIS — R079 Chest pain, unspecified: Secondary | ICD-10-CM | POA: Diagnosis not present

## 2021-09-23 ENCOUNTER — Encounter (INDEPENDENT_AMBULATORY_CARE_PROVIDER_SITE_OTHER): Payer: Self-pay | Admitting: *Deleted

## 2021-10-02 DIAGNOSIS — I10 Essential (primary) hypertension: Secondary | ICD-10-CM | POA: Diagnosis not present

## 2021-11-03 DIAGNOSIS — I10 Essential (primary) hypertension: Secondary | ICD-10-CM | POA: Diagnosis not present

## 2021-11-18 ENCOUNTER — Ambulatory Visit (INDEPENDENT_AMBULATORY_CARE_PROVIDER_SITE_OTHER): Payer: PPO | Admitting: Gastroenterology

## 2021-11-18 ENCOUNTER — Encounter (INDEPENDENT_AMBULATORY_CARE_PROVIDER_SITE_OTHER): Payer: Self-pay | Admitting: Gastroenterology

## 2021-11-18 VITALS — BP 133/76 | HR 66 | Temp 98.0°F | Ht 62.0 in | Wt 134.1 lb

## 2021-11-18 DIAGNOSIS — F1990 Other psychoactive substance use, unspecified, uncomplicated: Secondary | ICD-10-CM

## 2021-11-18 DIAGNOSIS — R195 Other fecal abnormalities: Secondary | ICD-10-CM | POA: Diagnosis not present

## 2021-11-18 DIAGNOSIS — R1310 Dysphagia, unspecified: Secondary | ICD-10-CM

## 2021-11-18 NOTE — Patient Instructions (Signed)
It was nice to meet you! We will schedule colonoscopy for positive cologuard and EGD for your swallowing Please avoid NSAIDs (advil, aleve, naproxen, goody powder, BC powder ibuprofen) as these can be very hard on your GI tract, causing inflammation, ulcers, bleeding and damage to the lining of your GI tract.   Follow up 3 months

## 2021-11-18 NOTE — Progress Notes (Signed)
Referring Provider: Glenda Chroman, MD Primary Care Physician:  Glenda Chroman, MD Primary GI Physician: new  Chief Complaint  Patient presents with   Blood In Stools    New patient. Referred for positive cologuard. Has also seen some blood in stools. Would also like to do EGD with colonoscopy. Has trouble with reflux and food getting stuck until she vomits. Reports she has esophagus stretched in the past.    HPI:   Joanna Sutton is a 66 y.o. female with past medical history of depression, GERD, high cholesterol, HTN, history of esophagitis and prepyloric ulcers.   Patient presenting today for positive cologuard, rectal bleeding and dysphagia.   Positive cologuard in June 2023.  Patient reports intermittent toilet tissue hematochezia, occurring maybe twice per month, having a BM almost daily with stools ranging from loose to harder, occasionally has to strain. Denies rectal irritation, burning, pain. Some occasional abdominal pain in mid lower abdomen, feels crampy in nature, sometimes improved with defecation. She denies weight loss or changes in appetite.   She endorses some dysphagia where food will stick in her chest, this has been occurring on and off since 2016 when last EGD was done. Occurs especially with thicker, drier foods, cooked onions and peppers. She will have to go regurgitate the food back up when this occurs. Happens maybe 2-3 times per month. No issues with liquids or pills. She has occasional indigestion which she takes tums for. Denies melena. Denies early satiety   NSAID use: BC powders TID for some time Social hx: social etoh, no tobacco Fam hx: no crc or pancreatic cancer  Last Colonoscopy:May 2012 diverticula at sigmoid, single ulcer at sigmoid, external hemorrhoids  Last Endoscopy:12/2014 Healed esophagitis and no evidence of recurrent Schatzki's ring. Gastritis with four prepyloric ulcers. On last exam she only had two ulcers. Multiple biopsies taken from  margin of two of the larger gastric ulcers. 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  Small ulcer at distal esophagus extending into GE junction. Small sliding hiatal hernia. Two gastric ulcers at antrum measuring 4 and 6 mm. Ulcer towards lesser curvature was much deeper. It was biopsied for histology. Mark edema to prepyloric and pyloric channel mucosa.  Recommendations:    Past Medical History:  Diagnosis Date   Depression    Diarrhea    GERD (gastroesophageal reflux disease)    High cholesterol    Hypertension     Past Surgical History:  Procedure Laterality Date   APPENDECTOMY     AUGMENTATION MAMMAPLASTY     BREAST ENHANCEMENT SURGERY     CESAREAN SECTION     CHOLECYSTECTOMY     ESOPHAGEAL DILATION N/A 09/27/2014   Procedure: ESOPHAGEAL DILATION;  Surgeon: Rogene Houston, MD;  Location: [facility];  Service: Endoscopy;  Laterality: N/A;   ESOPHAGOGASTRODUODENOSCOPY N/A 09/27/2014   Procedure: ESOPHAGOGASTRODUODENOSCOPY (EGD);  Surgeon: Rogene Houston, MD;  Location: [facility];  Service: Endoscopy;  Laterality: N/A;  930   ESOPHAGOGASTRODUODENOSCOPY N/A 01/03/2015   Procedure: ESOPHAGOGASTRODUODENOSCOPY (EGD);  Surgeon: Rogene Houston, MD;  Location: [facility];  Service: Endoscopy;  Laterality: N/A;  300   NASAL SINUS SURGERY     PATELLA FRACTURE SURGERY Left    around 2016    Current Outpatient Medications  Medication Sig Dispense Refill   acetaminophen (TYLENOL) 500 MG tablet Take 500 mg by mouth every 6 (six) hours as needed for  pain.     atorvastatin (LIPITOR) 40 MG tablet Take 40 mg by mouth daily.     cetirizine (ZYRTEC) 10 MG chewable tablet Chew 10 mg by mouth daily.     cyanocobalamin (VITAMIN B12) 1000 MCG tablet Take 1,000 mcg by mouth daily.     escitalopram (LEXAPRO) 10 MG tablet Take 10 mg by mouth daily.     lisinopril (ZESTRIL) 10 MG tablet Take 10 mg by  mouth daily.     pantoprazole (PROTONIX) 40 MG tablet Take 1 tablet (40 mg total) by mouth 2 (two) times daily before a meal. 60 tablet 3   No current facility-administered medications for this visit.    Allergies as of 11/18/2021 - Review Complete 11/18/2021  Allergen Reaction Noted   Bactrim [sulfamethoxazole-trimethoprim] Hives 11/18/2021   Levaquin [levofloxacin] Nausea And Vomiting 09/24/2014   Penicillins Rash 11/29/2012    Family History  Problem Relation Age of Onset   Leukemia Father    Cirrhosis Brother        non alcholic cirrhosis, fatty liver    Social History   Socioeconomic History   Marital status: Divorced    Spouse name: Not on file   Number of children: Not on file   Years of education: Not on file   Highest education level: Not on file  Occupational History   Not on file  Tobacco Use   Smoking status: Never    Passive exposure: Never   Smokeless tobacco: Never  Substance and Sexual Activity   Alcohol use: No   Drug use: No   Sexual activity: Never  Other Topics Concern   Not on file  Social History Narrative   Not on file   Social Determinants of Health   Financial Resource Strain: Not on file  Food Insecurity: Not on file  Transportation Needs: Not on file  Physical Activity: Not on file  Stress: Not on file  Social Connections: Not on file   Review of systems General: negative for malaise, night sweats, fever, chills, weight loss  Neck: Negative for lumps, goiter, pain and significant neck swelling Resp: Negative for cough, wheezing, dyspnea at rest CV: Negative for chest pain, leg swelling, palpitations, orthopnea GI: denies melena, nausea, vomiting, dysphagia, odyonophagia, early satiety or unintentional weight loss. +toilet tissue hematochezia +loose to harder stools MSK: Negative for joint pain or swelling, back pain, and muscle pain. Derm: Negative for itching or rash Psych: Denies depression, anxiety, memory loss, confusion. No  homicidal or suicidal ideation.  Heme: Negative for prolonged bleeding, bruising easily, and swollen nodes. Endocrine: Negative for cold or heat intolerance, polyuria, polydipsia and goiter. Neuro: negative for tremor, gait imbalance, syncope and seizures. The remainder of the review of systems is noncontributory.  Physical Exam: BP 133/76 (BP Location: Left Arm, Patient Position: Sitting, Cuff Size: Normal)   Pulse 66   Temp 98 F (36.7 C) (Oral)   Ht '5\' 2"'$  (1.575 m)   Wt 134 lb 1.6 oz (60.8 kg)   BMI 24.53 kg/m  General:   Alert and oriented. No distress noted. Pleasant and cooperative.  Head:  Normocephalic and atraumatic. Eyes:  Conjuctiva clear without scleral icterus. Mouth:  Oral mucosa pink and moist. Good dentition. No lesions. Heart: Normal rate and rhythm, s1 and s2 heart sounds present.  Lungs: Clear lung sounds in all lobes. Respirations equal and unlabored. Abdomen:  +BS, soft, non-tender and non-distended. No rebound or guarding. No HSM or masses noted. Derm: No palmar erythema or jaundice Msk:  Symmetrical without gross deformities. Normal posture. Extremities:  Without edema. Neurologic:  Alert and  oriented x4 Psych:  Alert and cooperative. Normal mood and affect.  Invalid input(s): "6 MONTHS"   ASSESSMENT: Joanna Sutton is a 65 y.o. female presenting today as a new patient for positive cologuard and dysphagia.  Positive cologuard in June 2023, also with some intermittent toilet tissue hematochezia, though in the setting of harder stools and straining. discussed with patient that Cologuard is intended for the qualitative detection of colorectal neoplasia associated DNA markers and for the presence of occult hemoglobin in human stool. A positive result may indicate the presence of colorectal cancer (CRC) or pre cancerous polyps, and should be followed by colonoscopy. A negative Cologuard test result does not guarantee absence of cancer or pre cancerous polyp. False  positives and false negatives do occur. Recommend proceeding with colonoscopy for further evaluation. Should make sure she is keeping stools soft and avoiding straining.   Patient with intermittent dysphagia off and on since last EGD in 2016, sometimes having to regurgitate foods back up as they will not pass. History of both gastric ulcers and esophageal web/Schatzki ring. Patient counseled on importance of NSAID cessation given potential adverse effects to include ulcers, bleeding, mucosal damage, especially with hx of ulcers as she is currently using BC powders up to TID. Recommend proceeding with EGD for further evaluation as she likely will require dilation of recurrent schatzki ring/ esophageal web given her history.    Indications, risks and benefits of procedure discussed in detail with patient. Patient verbalized understanding and is in agreement to proceed with EGD/colonoscopy at this time.   PLAN:  Schedule colonoscopy and EGD +/- dilation-ASA II  2. Chewing precautions 3. NSAID cessation 4. Avoid straining, limit toilet time <5 minutes   All questions were answered, patient verbalized understanding and is in agreement with plan as outlined above.    Follow Up: 3 months   Jonatan Wilsey L. Alver Sorrow, MSN, APRN, AGNP-C Adult-Gerontology Nurse Practitioner [facility]

## 2021-11-24 DIAGNOSIS — I10 Essential (primary) hypertension: Secondary | ICD-10-CM | POA: Diagnosis not present

## 2021-11-24 DIAGNOSIS — E7800 Pure hypercholesterolemia, unspecified: Secondary | ICD-10-CM | POA: Diagnosis not present

## 2021-11-24 DIAGNOSIS — Z299 Encounter for prophylactic measures, unspecified: Secondary | ICD-10-CM | POA: Diagnosis not present

## 2021-11-24 DIAGNOSIS — R600 Localized edema: Secondary | ICD-10-CM | POA: Diagnosis not present

## 2021-11-24 DIAGNOSIS — F339 Major depressive disorder, recurrent, unspecified: Secondary | ICD-10-CM | POA: Diagnosis not present

## 2021-11-26 ENCOUNTER — Other Ambulatory Visit (INDEPENDENT_AMBULATORY_CARE_PROVIDER_SITE_OTHER): Payer: Self-pay

## 2021-11-26 ENCOUNTER — Encounter (INDEPENDENT_AMBULATORY_CARE_PROVIDER_SITE_OTHER): Payer: Self-pay

## 2021-11-26 ENCOUNTER — Telehealth (INDEPENDENT_AMBULATORY_CARE_PROVIDER_SITE_OTHER): Payer: Self-pay

## 2021-11-26 DIAGNOSIS — M1712 Unilateral primary osteoarthritis, left knee: Secondary | ICD-10-CM | POA: Diagnosis not present

## 2021-11-26 DIAGNOSIS — R195 Other fecal abnormalities: Secondary | ICD-10-CM

## 2021-11-26 DIAGNOSIS — R1310 Dysphagia, unspecified: Secondary | ICD-10-CM

## 2021-11-26 DIAGNOSIS — Z1211 Encounter for screening for malignant neoplasm of colon: Secondary | ICD-10-CM

## 2021-11-26 MED ORDER — PEG 3350-KCL-NA BICARB-NACL 420 G PO SOLR: 4000.0000 mL | ORAL | 0 refills | Status: DC

## 2021-11-26 NOTE — Telephone Encounter (Signed)
Joanna Sutton, CMA  ?

## 2021-12-03 DIAGNOSIS — M1712 Unilateral primary osteoarthritis, left knee: Secondary | ICD-10-CM | POA: Diagnosis not present

## 2021-12-03 DIAGNOSIS — I10 Essential (primary) hypertension: Secondary | ICD-10-CM | POA: Diagnosis not present

## 2021-12-11 DIAGNOSIS — M1712 Unilateral primary osteoarthritis, left knee: Secondary | ICD-10-CM | POA: Diagnosis not present

## 2022-01-02 DIAGNOSIS — I10 Essential (primary) hypertension: Secondary | ICD-10-CM | POA: Diagnosis not present

## 2022-01-08 ENCOUNTER — Other Ambulatory Visit: Payer: Self-pay

## 2022-01-08 ENCOUNTER — Encounter (HOSPITAL_COMMUNITY)
Admission: RE | Admit: 2022-01-08 | Discharge: 2022-01-08 | Disposition: A | Discharge status: Home / Self Care | Payer: PPO | Source: Ambulatory Visit | Attending: Gastroenterology | Admitting: Gastroenterology

## 2022-01-08 ENCOUNTER — Encounter (HOSPITAL_COMMUNITY): Payer: Self-pay

## 2022-01-13 ENCOUNTER — Ambulatory Visit (HOSPITAL_COMMUNITY)
Admission: RE | Admit: 2022-01-13 | Discharge: 2022-01-13 | Disposition: A | Discharge status: Home / Self Care | Payer: PPO | Attending: Gastroenterology | Admitting: Gastroenterology

## 2022-01-13 ENCOUNTER — Encounter (HOSPITAL_COMMUNITY)
Admission: RE | Disposition: A | Discharge status: Home / Self Care | Payer: Self-pay | Source: Home / Self Care | Attending: Gastroenterology

## 2022-01-13 ENCOUNTER — Other Ambulatory Visit: Payer: Self-pay

## 2022-01-13 ENCOUNTER — Encounter (HOSPITAL_COMMUNITY): Payer: Self-pay | Admitting: Gastroenterology

## 2022-01-13 ENCOUNTER — Ambulatory Visit (HOSPITAL_COMMUNITY): Payer: PPO | Admitting: Anesthesiology

## 2022-01-13 ENCOUNTER — Ambulatory Visit (HOSPITAL_BASED_OUTPATIENT_CLINIC_OR_DEPARTMENT_OTHER): Payer: PPO | Admitting: Anesthesiology

## 2022-01-13 DIAGNOSIS — K635 Polyp of colon: Secondary | ICD-10-CM

## 2022-01-13 DIAGNOSIS — D122 Benign neoplasm of ascending colon: Secondary | ICD-10-CM | POA: Insufficient documentation

## 2022-01-13 DIAGNOSIS — R1310 Dysphagia, unspecified: Secondary | ICD-10-CM | POA: Diagnosis not present

## 2022-01-13 DIAGNOSIS — Z79899 Other long term (current) drug therapy: Secondary | ICD-10-CM | POA: Diagnosis not present

## 2022-01-13 DIAGNOSIS — I10 Essential (primary) hypertension: Secondary | ICD-10-CM | POA: Diagnosis not present

## 2022-01-13 DIAGNOSIS — K2289 Other specified disease of esophagus: Secondary | ICD-10-CM

## 2022-01-13 DIAGNOSIS — K259 Gastric ulcer, unspecified as acute or chronic, without hemorrhage or perforation: Secondary | ICD-10-CM

## 2022-01-13 DIAGNOSIS — K5730 Diverticulosis of large intestine without perforation or abscess without bleeding: Secondary | ICD-10-CM

## 2022-01-13 DIAGNOSIS — K648 Other hemorrhoids: Secondary | ICD-10-CM

## 2022-01-13 DIAGNOSIS — K222 Esophageal obstruction: Secondary | ICD-10-CM | POA: Diagnosis not present

## 2022-01-13 DIAGNOSIS — D123 Benign neoplasm of transverse colon: Secondary | ICD-10-CM | POA: Diagnosis not present

## 2022-01-13 DIAGNOSIS — R195 Other fecal abnormalities: Secondary | ICD-10-CM | POA: Diagnosis not present

## 2022-01-13 DIAGNOSIS — K209 Esophagitis, unspecified without bleeding: Secondary | ICD-10-CM | POA: Diagnosis not present

## 2022-01-13 DIAGNOSIS — K449 Diaphragmatic hernia without obstruction or gangrene: Secondary | ICD-10-CM

## 2022-01-13 DIAGNOSIS — K2100 Gastro-esophageal reflux disease with esophagitis, without bleeding: Secondary | ICD-10-CM | POA: Diagnosis not present

## 2022-01-13 DIAGNOSIS — F32A Depression, unspecified: Secondary | ICD-10-CM | POA: Insufficient documentation

## 2022-01-13 DIAGNOSIS — Z1211 Encounter for screening for malignant neoplasm of colon: Secondary | ICD-10-CM | POA: Diagnosis not present

## 2022-01-13 DIAGNOSIS — K5790 Diverticulosis of intestine, part unspecified, without perforation or abscess without bleeding: Secondary | ICD-10-CM | POA: Diagnosis not present

## 2022-01-13 HISTORY — PX: BIOPSY: SHX5522

## 2022-01-13 HISTORY — PX: ESOPHAGOGASTRODUODENOSCOPY (EGD) WITH PROPOFOL: SHX5813

## 2022-01-13 HISTORY — PX: SUBMUCOSAL LIFTING INJECTION: SHX6855

## 2022-01-13 HISTORY — PX: POLYPECTOMY: SHX5525

## 2022-01-13 HISTORY — PX: COLONOSCOPY WITH PROPOFOL: SHX5780

## 2022-01-13 HISTORY — PX: SUBMUCOSAL TATTOO INJECTION: SHX6856

## 2022-01-13 LAB — HM COLONOSCOPY

## 2022-01-13 SURGERY — COLONOSCOPY WITH PROPOFOL
Anesthesia: General

## 2022-01-13 MED ORDER — SPOT INK MARKER SYRINGE KIT
PACK | SUBMUCOSAL | Status: DC | PRN
  Administered 2022-01-13: .4 mL via SUBMUCOSAL

## 2022-01-13 MED ORDER — PROPOFOL 500 MG/50ML IV EMUL
INTRAVENOUS | Status: DC | PRN
  Administered 2022-01-13: 150 ug/kg/min via INTRAVENOUS

## 2022-01-13 MED ORDER — LACTATED RINGERS IV SOLN: INTRAVENOUS | Status: DC

## 2022-01-13 MED ORDER — PROPOFOL 10 MG/ML IV BOLUS
INTRAVENOUS | Status: DC | PRN
  Administered 2022-01-13 (×7): 20 mg via INTRAVENOUS

## 2022-01-13 NOTE — Anesthesia Postprocedure Evaluation (Signed)
Anesthesia Post Note  Patient: KARISHMA UNREIN  Procedure(s) Performed: COLONOSCOPY WITH PROPOFOL ESOPHAGOGASTRODUODENOSCOPY (EGD) WITH PROPOFOL BIOPSY Balloon dilation wire-guided POLYPECTOMY SUBMUCOSAL LIFTING INJECTION SUBMUCOSAL TATTOO INJECTION  Patient location during evaluation: Phase II Anesthesia Type: General Level of consciousness: awake and alert and oriented Pain management: pain level controlled Vital Signs Assessment: post-procedure vital signs reviewed and stable Respiratory status: spontaneous breathing, nonlabored ventilation and respiratory function stable Cardiovascular status: blood pressure returned to baseline and stable Postop Assessment: no apparent nausea or vomiting Anesthetic complications: no   No notable events documented.   Last Vitals:  Vitals:   01/13/22 0900 01/13/22 1217  BP: 138/74 (!) 108/59  Pulse: (!) 51 67  Resp: 11 19  Temp: 36.6 C 36.6 C  SpO2: 98% 100%    Last Pain:  Vitals:   01/13/22 1217  TempSrc: Oral  PainSc: 0-No pain                 Nikiesha Milford C Normon Pettijohn

## 2022-01-13 NOTE — Anesthesia Procedure Notes (Signed)
Procedure Name: MAC Date/Time: 01/13/2022 11:09 AM  Performed by: Lieutenant Diego, CRNAPre-anesthesia Checklist: Patient identified, Emergency Drugs available, Suction available, Patient being monitored and Timeout performed Patient Re-evaluated:Patient Re-evaluated prior to induction Oxygen Delivery Method: Nasal cannula Preoxygenation: Pre-oxygenation with 100% oxygen Induction Type: IV induction

## 2022-01-13 NOTE — Op Note (Signed)
[facility] Patient Name: Joanna Sutton Procedure Date: 01/13/2022 10:57 AM MRN: [id number] Date of Birth: [birth year] Attending MD: Maylon Peppers ,  CSN: [id number] Age: 66 Admit Type: Outpatient Procedure:                Colonoscopy Indications:              Positive Cologuard test Providers:                Maylon Peppers, Crystal Page, Everardo Pacific,                            Brewer, Technician Referring MD:              Medicines:                Monitored Anesthesia Care Complications:            No immediate complications. Estimated Blood Loss:     Estimated blood loss: none. Procedure:                Pre-Anesthesia Assessment:                           - Prior to the procedure, a History and Physical                            was performed, and patient medications, allergies                            and sensitivities were reviewed. The patient's                            tolerance of previous anesthesia was reviewed.                           - The risks and benefits of the procedure and the                            sedation options and risks were discussed with the                            patient. All questions were answered and informed                            consent was obtained.                           - After reviewing the risks and benefits, the                            patient was deemed in satisfactory condition to                            undergo the procedure.                           - ASA Grade Assessment: II - A patient with mild  systemic disease.                           After obtaining informed consent, the colonoscope                            was passed under direct vision. Throughout the                            procedure, the patient's blood pressure, pulse, and                            oxygen saturations were monitored continuously. The                            [id number]-HQ190L  ([id number]) scope was introduced through                            the anus and advanced to the the cecum, identified                            by appendiceal orifice and ileocecal valve. The                            colonoscopy was performed without difficulty. The                            patient tolerated the procedure well. The quality                            of the bowel preparation was good. Scope In: 11:25:01 AM Scope Out: 12:11:06 PM Scope Withdrawal Time: 0 hours 39 minutes 29 seconds  Total Procedure Duration: 0 hours 46 minutes 5 seconds  Findings:      The perianal and digital rectal examinations were normal.      A 15 to 20 mm polyp was found in the distal ascending colon. The polyp       was flat. Area was successfully injected with 3 mL Eleview for a lift       polypectomy. Imaging was performed using white light and narrow band       imaging to visualize the mucosa and demarcate the polyp site after       injection for EMR purposes. The polyp was removed with a hot snare in a       piecemeal fashion. Resection and retrieval were complete. To prevent       bleeding after the polypectomy, three hemostatic clips were successfully       placed. There was no bleeding at the end of the procedure. Area distal       to polypectomy was tattooed with an injection of 4 mL of Spot (carbon       black).      Three sessile polyps were found in the transverse colon and ascending       colon. The polyps were 3 to 6 mm in size. These polyps were removed with       a cold snare. Resection and retrieval were complete.  Scattered large-mouthed diverticula were found in the sigmoid colon and       ascending colon.      Non-bleeding internal hemorrhoids were found during retroflexion. The       hemorrhoids were small. Impression:               - One 15 to 20 mm polyp in the distal ascending                            colon, removed with a hot snare. Resected and                             retrieved. Injected. Clips were placed. Tattooed.                           - Three 3 to 6 mm polyps in the transverse colon                            and in the ascending colon, removed with a cold                            snare. Resected and retrieved.                           - Diverticulosis in the sigmoid colon and in the                            ascending colon.                           - Non-bleeding internal hemorrhoids. Moderate Sedation:      Per Anesthesia Care      Per Anesthesia Care Recommendation:           - Discharge patient to home (ambulatory).                           - Resume previous diet.                           - Await pathology results.                           - Repeat colonoscopy in 6 months for surveillance                            after piecemeal polypectomy. Procedure Code(s):        --- Professional ---                           [phone], Colonoscopy, flexible; with removal of                            tumor(s), polyp(s), or other lesion(s) by snare                            technique  45381, Colonoscopy, flexible; with directed                            submucosal injection(s), any substance Diagnosis Code(s):        --- Professional ---                           K63.5, Polyp of colon                           K64.8, Other hemorrhoids                           R19.5, Other fecal abnormalities                           K57.30, Diverticulosis of large intestine without                            perforation or abscess without bleeding CPT copyright 2019 American Medical Association. All rights reserved. The codes documented in this report are preliminary and upon coder review may  be revised to meet current compliance requirements. Maylon Peppers, MD Maylon Peppers,  01/13/2022 12:20:02 PM This report has been signed electronically. Number of Addenda: 0

## 2022-01-13 NOTE — Discharge Instructions (Addendum)
You are being discharged to home.  Resume your previous diet.  We are waiting for your pathology results.  Continue your present medications.  Your physician has recommended a repeat colonoscopy in six months for surveillance after today's piecemeal polypectomy.

## 2022-01-13 NOTE — Transfer of Care (Signed)
Immediate Anesthesia Transfer of Care Note  Patient: Joanna Sutton  Procedure(s) Performed: COLONOSCOPY WITH PROPOFOL ESOPHAGOGASTRODUODENOSCOPY (EGD) WITH PROPOFOL BIOPSY Balloon dilation wire-guided POLYPECTOMY SUBMUCOSAL LIFTING INJECTION SUBMUCOSAL TATTOO INJECTION  Patient Location: PACU  Anesthesia Type:MAC  Level of Consciousness: drowsy  Airway & Oxygen Therapy: Patient Spontanous Breathing and Patient connected to nasal cannula oxygen  Post-op Assessment: Report given to RN and Post -op Vital signs reviewed and stable  Post vital signs: Reviewed and stable  Last Vitals:  Vitals Value Taken Time  BP    Temp    Pulse    Resp    SpO2      Last Pain:  Vitals:   01/13/22 1102  PainSc: 0-No pain         Complications: No notable events documented.

## 2022-01-13 NOTE — Op Note (Signed)
[facility] Patient Name: Joanna Sutton Procedure Date: 01/13/2022 10:56 AM MRN: [id number] Date of Birth: 06-12-1955 Attending MD: Maylon Peppers ,  CSN: [id number] Age: 66 Admit Type: Outpatient Procedure:                Upper GI endoscopy Indications:              Dysphagia Providers:                Maylon Peppers, Crystal Page, Everardo Pacific Referring MD:              Medicines:                Monitored Anesthesia Care Complications:            No immediate complications. Estimated Blood Loss:     Estimated blood loss: none. Procedure:                Pre-Anesthesia Assessment:                           - Prior to the procedure, a History and Physical                            was performed, and patient medications, allergies                            and sensitivities were reviewed. The patient's                            tolerance of previous anesthesia was reviewed.                           - The risks and benefits of the procedure and the                            sedation options and risks were discussed with the                            patient. All questions were answered and informed                            consent was obtained.                           - After reviewing the risks and benefits, the                            patient was deemed in satisfactory condition to                            undergo the procedure.                           - ASA Grade Assessment: II - A patient with mild                            systemic disease.  After obtaining informed consent, the endoscope was                            passed under direct vision. Throughout the                            procedure, the patient's blood pressure, pulse, and                            oxygen saturations were monitored continuously. The                            [id number]-H190 ([id number]) scope was introduced through the                             mouth, and advanced to the second part of duodenum.                            The upper GI endoscopy was accomplished without                            difficulty. The patient tolerated the procedure                            well. Scope In: 11:06:32 AM Scope Out: 11:17:26 AM Total Procedure Duration: 0 hours 10 minutes 54 seconds  Findings:      White nummular lesions were noted in the middle third of the esophagus.       Biopsies were taken with a cold forceps for histology.      A 1 cm hiatal hernia was present.      A non-obstructing Schatzki ring was found at the gastroesophageal       junction. A TTS dilator was passed through the scope. Dilation with a       15-16.5-18 mm balloon dilator was performed to 18 mm. mucosal disruption       was seen upon reinspection.      The gastroesophageal flap valve was visualized endoscopically and       classified as Hill Grade IV (no fold, wide open lumen, hiatal hernia       present).      Two non-bleeding superficial gastric ulcers with a clean ulcer base       (Forrest Class III) were found in the gastric antrum. The largest lesion       was 4 mm in largest dimension. Biopsies were taken with a cold forceps       for Helicobacter pylori testing.      The examined duodenum was normal. Impression:               - White nummular lesions in esophageal mucosa.                            Biopsied.                           - 1 cm hiatal hernia.                           -  Non-obstructing Schatzki ring. Dilated.                           - Gastroesophageal flap valve classified as Hill                            Grade IV (no fold, wide open lumen, hiatal hernia                            present).                           - Non-bleeding gastric ulcers with a clean ulcer                            base (Forrest Class III). Biopsied.                           - Normal examined duodenum. Moderate Sedation:      Per Anesthesia  Care Recommendation:           - Discharge patient to home (ambulatory).                           - Resume previous diet.                           - Await pathology results.                           - Continue present medications.                           - Check H. pylori serology. Procedure Code(s):        --- Professional ---                           [phone], Esophagogastroduodenoscopy, flexible,                            transoral; with transendoscopic balloon dilation of                            esophagus (less than 30 mm diameter)                           43239, 59, Esophagogastroduodenoscopy, flexible,                            transoral; with biopsy, single or multiple Diagnosis Code(s):        --- Professional ---                           K22.8, Other specified diseases of esophagus                           K44.9, Diaphragmatic hernia without obstruction or  gangrene                           K22.2, Esophageal obstruction                           K25.9, Gastric ulcer, unspecified as acute or                            chronic, without hemorrhage or perforation                           R13.10, Dysphagia, unspecified CPT copyright 2019 American Medical Association. All rights reserved. The codes documented in this report are preliminary and upon coder review may  be revised to meet current compliance requirements. Maylon Peppers, MD Maylon Peppers,  01/13/2022 12:15:58 PM This report has been signed electronically. Number of Addenda: 0

## 2022-01-13 NOTE — H&P (Signed)
Joanna Sutton is an 66 y.o. female.   Chief Complaint: Dysphagia and positive Cologuard HPI: 66 y.o. female with past medical history of depression, GERD, high cholesterol, HTN, history of esophagitis and prepyloric ulcers, coming for evaluation of dysphagia and positive Cologuard.  Patient reported she has presented intermittent episodes dysphagia and choking like episodes recently.  Denies any other complaints such as nausea, vomiting, fever, chills, hematochezia, melena, hematemesis, abdominal distention, abdominal pain, diarrhea, jaundice, pruritus or weight loss.  Currently PPI once a day which controls her heartburn episodes.  Had a positive Cologuard in June 2023, no family history of colon cancer.   Past Medical History:  Diagnosis Date   Depression    Diarrhea    GERD (gastroesophageal reflux disease)    High cholesterol    Hypertension     Past Surgical History:  Procedure Laterality Date   APPENDECTOMY     AUGMENTATION MAMMAPLASTY     BREAST ENHANCEMENT SURGERY     CESAREAN SECTION     CHOLECYSTECTOMY     ESOPHAGEAL DILATION N/A 09/27/2014   Procedure: ESOPHAGEAL DILATION;  Surgeon: Rogene Houston, MD;  [facility];  Service: Endoscopy;  Laterality: N/A;   ESOPHAGOGASTRODUODENOSCOPY N/A 09/27/2014   Procedure: ESOPHAGOGASTRODUODENOSCOPY (EGD);  Surgeon: Rogene Houston, MD;  [facility];  Service: Endoscopy;  Laterality: N/A;  930   ESOPHAGOGASTRODUODENOSCOPY N/A 01/03/2015   Procedure: ESOPHAGOGASTRODUODENOSCOPY (EGD);  Surgeon: Rogene Houston, MD;  [facility]: [facility];  Service: Endoscopy;  Laterality: N/A;  300   NASAL SINUS SURGERY     PATELLA FRACTURE SURGERY Left    around 2016    Family History  Problem Relation Age of Onset   Leukemia Father    Cirrhosis Brother        non alcholic cirrhosis, fatty liver   Social History:  reports that she has never smoked. She has never been exposed to tobacco smoke. She has never used  smokeless tobacco. She reports that she does not drink alcohol and does not use drugs.  Allergies:  Allergies  Allergen Reactions   Bactrim [Sulfamethoxazole-Trimethoprim] Hives   Levaquin [Levofloxacin] Nausea And Vomiting   Penicillins Rash    Medications Prior to Admission  Medication Sig Dispense Refill   acetaminophen (TYLENOL) 500 MG tablet Take 500 mg by mouth every 6 (six) hours as needed for pain.     atorvastatin (LIPITOR) 40 MG tablet Take 40 mg by mouth daily.     cetirizine (ZYRTEC) 10 MG tablet Take 10 mg by mouth daily.     cyanocobalamin (VITAMIN B12) 1000 MCG tablet Take 2,000 mcg by mouth daily.     escitalopram (LEXAPRO) 10 MG tablet Take 10 mg by mouth daily.     lisinopril (ZESTRIL) 10 MG tablet Take 10-20 mg by mouth daily.     pantoprazole (PROTONIX) 40 MG tablet Take 1 tablet (40 mg total) by mouth 2 (two) times daily before a meal. (Patient taking differently: Take 40 mg by mouth daily.) 60 tablet 3   polyethylene glycol-electrolytes (TRILYTE) 420 g solution Take 4,000 mLs by mouth as directed. 4000 mL 0    No results found for this or any previous visit (from the past 48 hour(s)). No results found.  Review of Systems  Constitutional: Negative.   HENT:  Positive for trouble swallowing.   Eyes: Negative.   Respiratory: Negative.    Cardiovascular: Negative.   Gastrointestinal: Negative.   Endocrine: Negative.   Genitourinary: Negative.  Musculoskeletal: Negative.   Skin: Negative.   Allergic/Immunologic: Negative.   Neurological: Negative.   Hematological: Negative.   Psychiatric/Behavioral: Negative.      Blood pressure 138/74, pulse (!) 51, temperature 97.8 F (36.6 C), resp. rate 11, height '5\' 2"'$  (1.575 m), weight 60 kg, SpO2 98 %. Physical Exam  GENERAL: The patient is AO x3, in no acute distress. HEENT: Head is normocephalic and atraumatic. EOMI are intact. Mouth is well hydrated and without lesions. NECK: Supple. No masses LUNGS: Clear to  auscultation. No presence of rhonchi/wheezing/rales. Adequate chest expansion HEART: RRR, normal s1 and s2. ABDOMEN: Soft, nontender, no guarding, no peritoneal signs, and nondistended. BS +. No masses. EXTREMITIES: Without any cyanosis, clubbing, rash, lesions or edema. NEUROLOGIC: AOx3, no focal motor deficit. SKIN: no jaundice, no rashes  Assessment/Plan 66 y.o. female with past medical history of depression, GERD, high cholesterol, HTN, history of esophagitis and prepyloric ulcers, coming for evaluation of dysphagia and positive Cologuard.  We will proceed with EGD and colonoscopy.  Harvel Quale, MD 01/13/2022, 9:23 AM

## 2022-01-13 NOTE — Anesthesia Preprocedure Evaluation (Addendum)
Anesthesia Evaluation  Patient identified by MRN, date of birth, ID band Patient awake    Reviewed: Allergy & Precautions, NPO status , Patient's Chart, lab work & pertinent test results  Airway Mallampati: III  TM Distance: <3 FB Neck ROM: Full  Mouth opening: Limited Mouth Opening  Dental  (+) Dental Advisory Given, Missing,    Pulmonary neg pulmonary ROS,    Pulmonary exam normal breath sounds clear to auscultation       Cardiovascular Exercise Tolerance: Good hypertension, Pt. on medications Normal cardiovascular exam Rhythm:Regular Rate:Normal     Neuro/Psych PSYCHIATRIC DISORDERS Depression negative neurological ROS     GI/Hepatic Neg liver ROS, GERD  Medicated and Controlled,  Endo/Other  negative endocrine ROS  Renal/GU negative Renal ROS  negative genitourinary   Musculoskeletal negative musculoskeletal ROS (+)   Abdominal   Peds negative pediatric ROS (+)  Hematology negative hematology ROS (+)   Anesthesia Other Findings   Reproductive/Obstetrics negative OB ROS                            Anesthesia Physical Anesthesia Plan  ASA: 2  Anesthesia Plan: General   Post-op Pain Management: Minimal or no pain anticipated   Induction: Intravenous  PONV Risk Score and Plan: TIVA  Airway Management Planned: Nasal Cannula and Natural Airway  Additional Equipment:   Intra-op Plan:   Post-operative Plan:   Informed Consent: I have reviewed the patients History and Physical, chart, labs and discussed the procedure including the risks, benefits and alternatives for the proposed anesthesia with the patient or authorized representative who has indicated his/her understanding and acceptance.     Dental advisory given  Plan Discussed with: CRNA and Surgeon  Anesthesia Plan Comments:         Anesthesia Quick Evaluation

## 2022-01-14 ENCOUNTER — Other Ambulatory Visit: Payer: Self-pay | Admitting: Gastroenterology

## 2022-01-14 DIAGNOSIS — K253 Acute gastric ulcer without hemorrhage or perforation: Secondary | ICD-10-CM

## 2022-01-15 LAB — SURGICAL PATHOLOGY

## 2022-01-16 ENCOUNTER — Telehealth (INDEPENDENT_AMBULATORY_CARE_PROVIDER_SITE_OTHER): Payer: Self-pay | Admitting: *Deleted

## 2022-01-16 ENCOUNTER — Other Ambulatory Visit (INDEPENDENT_AMBULATORY_CARE_PROVIDER_SITE_OTHER): Payer: Self-pay | Admitting: *Deleted

## 2022-01-16 ENCOUNTER — Encounter (HOSPITAL_COMMUNITY): Payer: Self-pay | Admitting: Gastroenterology

## 2022-01-16 DIAGNOSIS — R109 Unspecified abdominal pain: Secondary | ICD-10-CM

## 2022-01-16 NOTE — Telephone Encounter (Signed)
Patient called today and states Dr. Jenetta Downer wanted to do a test for bacteria in her stomach but she could not remember exactly what she was suppose to do. I see order was put in on 10/11 for h pylori special antigen. Do you want to change this to breath test and she stop her ppi for 2 weeks or did you want to do something different?   [phone]

## 2022-01-16 NOTE — Telephone Encounter (Signed)
Hi Wendy, Yes please let's do breath test and holding PPI for 2 weeks (can restart immediately after doing the test). Thanks

## 2022-01-16 NOTE — Telephone Encounter (Signed)
Called and discussed with patient and she verbalized understanding. She wanted orders mailed to her to do at [place]. Orders placed in the mail. She is aware to stop PPI for 2 weeks before test.

## 2022-01-19 DIAGNOSIS — C4441 Basal cell carcinoma of skin of scalp and neck: Secondary | ICD-10-CM | POA: Diagnosis not present

## 2022-01-23 ENCOUNTER — Encounter (INDEPENDENT_AMBULATORY_CARE_PROVIDER_SITE_OTHER): Payer: Self-pay | Admitting: *Deleted

## 2022-01-29 DIAGNOSIS — Z23 Encounter for immunization: Secondary | ICD-10-CM | POA: Diagnosis not present

## 2022-02-02 DIAGNOSIS — I10 Essential (primary) hypertension: Secondary | ICD-10-CM | POA: Diagnosis not present

## 2022-02-04 ENCOUNTER — Encounter (INDEPENDENT_AMBULATORY_CARE_PROVIDER_SITE_OTHER): Payer: Self-pay | Admitting: Gastroenterology

## 2022-02-09 DIAGNOSIS — R109 Unspecified abdominal pain: Secondary | ICD-10-CM | POA: Diagnosis not present

## 2022-02-11 LAB — H. PYLORI BREATH TEST: H pylori Breath Test: NEGATIVE

## 2022-02-16 ENCOUNTER — Ambulatory Visit (INDEPENDENT_AMBULATORY_CARE_PROVIDER_SITE_OTHER): Payer: PPO | Admitting: Gastroenterology

## 2022-02-16 ENCOUNTER — Encounter (INDEPENDENT_AMBULATORY_CARE_PROVIDER_SITE_OTHER): Payer: Self-pay | Admitting: Gastroenterology

## 2022-02-16 VITALS — BP 134/75 | HR 68 | Temp 98.1°F | Ht 62.0 in | Wt 135.0 lb

## 2022-02-16 DIAGNOSIS — F1990 Other psychoactive substance use, unspecified, uncomplicated: Secondary | ICD-10-CM | POA: Diagnosis not present

## 2022-02-16 DIAGNOSIS — K219 Gastro-esophageal reflux disease without esophagitis: Secondary | ICD-10-CM

## 2022-02-16 DIAGNOSIS — K253 Acute gastric ulcer without hemorrhage or perforation: Secondary | ICD-10-CM

## 2022-02-16 NOTE — Progress Notes (Addendum)
Referring Provider: Glenda Chroman, MD Primary Care Physician:  Glenda Chroman, MD Primary GI Physician: Jenetta Downer   Chief Complaint  Patient presents with   post procedure follow up    Follow up on dysphagia. Had EGD and colonoscopy last month. Swallowing seems to be better since having procedure.    HPI:   Joanna Sutton is a 66 y.o. female with past medical history of depression, GERD, high cholesterol, HTN, history of esophagitis and prepyloric ulcers.    Patient presenting today for follow up of rectal bleeding and dysphagia.  Last seen August 2023, at that time, recent positive cologuard. Having intermittent  toilet tissue hematochezia, occurring maybe twice per month, having a BM almost daily with stools ranging from loose to harder, occasionally has to strain. endorses some dysphagia where food will stick in her chest, this has been occurring on and off since 2016 when last EGD was done. Occurs especially with thicker, drier foods, cooked onions and peppers. She will have to go regurgitate the food back up when this occurs. Happens maybe 2-3 times per month. No issues with liquids or pills. She has occasional indigestion which she takes tums for.   Recommended to have EGD and colonoscopy, NSAID cessation.   Present:  Patient reports that dysphagia resolved since EGD. Patient previously doing BC powder TID, reports she is no longer doing these, but is taking ibuprofen maybe 3x/week for headache. Denies abdominal pain, nausea, vomiting. No rectal bleeding or melena. She denies changes in appetite or weight loss. She is taking pantoprazole '40mg'$  once daily. Denies any GERD symptoms, did note some during stopping this in prep for her h pylori testing. Overall feels she is doing well.    Last Colonoscopy:01/13/22 One 15 to 20 mm polyp in the distal ascending colon-SSL Resected and retrieved. Injected. Clips were placed. Tattooed. - Three 3 to 6 mm polyps in the transverse colon and in the  ascending colon (TAx3) - Diverticulosis in the sigmoid colon and in the ascending colon. - Non-bleeding internal hemorrhoids. Last Endoscopy: 01/13/22 White nummular lesions in esophageal mucosa. Biopsied-mild chronic esophagitis - 1 cm hiatal hernia. - Non-obstructing Schatzki ring. Dilated. - Gastroesophageal flap valve classified as Hill Grade IV (no fold, wide open lumen, hiatal hernia present). - Non-bleeding gastric ulcers with a clean ulcer base (Forrest Class III). Biopsied-normal - Normal examined duodenum.  Recommendations:  Repeat colonoscopy in April 2024  Past Medical History:  Diagnosis Date   Depression    Diarrhea    GERD (gastroesophageal reflux disease)    High cholesterol    Hypertension     Past Surgical History:  Procedure Laterality Date   APPENDECTOMY     AUGMENTATION MAMMAPLASTY     BIOPSY  01/13/2022   Procedure: BIOPSY;  Surgeon: Harvel Quale, MD;  [facility];  Service: Gastroenterology;;   BREAST ENHANCEMENT SURGERY     CESAREAN SECTION     CHOLECYSTECTOMY     COLONOSCOPY WITH PROPOFOL N/A 01/13/2022   Procedure: COLONOSCOPY WITH PROPOFOL;  Surgeon: Harvel Quale, MD;  [facility];  Service: Gastroenterology;  Laterality: N/A;  1030 am ASA 2   ESOPHAGEAL DILATION N/A 09/27/2014   Procedure: ESOPHAGEAL DILATION;  Surgeon: Rogene Houston, MD;  [facility];  Service: Endoscopy;  Laterality: N/A;   ESOPHAGOGASTRODUODENOSCOPY N/A 09/27/2014   Procedure: ESOPHAGOGASTRODUODENOSCOPY (EGD);  Surgeon: Rogene Houston, MD;  [facility];  Service: Endoscopy;  Laterality: N/A;  930  ESOPHAGOGASTRODUODENOSCOPY N/A 01/03/2015   Procedure: ESOPHAGOGASTRODUODENOSCOPY (EGD);  Surgeon: Rogene Houston, MD;  [facility];  Service: Endoscopy;  Laterality: N/A;  300   ESOPHAGOGASTRODUODENOSCOPY (EGD) WITH PROPOFOL N/A 01/13/2022   Procedure: ESOPHAGOGASTRODUODENOSCOPY (EGD) WITH  PROPOFOL;  Surgeon: Harvel Quale, MD;  [facility];  Service: Gastroenterology;  Laterality: N/A;   NASAL SINUS SURGERY     PATELLA FRACTURE SURGERY Left    around 2016   POLYPECTOMY  01/13/2022   Procedure: POLYPECTOMY;  Surgeon: Harvel Quale, MD;  [facility];  Service: Gastroenterology;;   SUBMUCOSAL LIFTING INJECTION  01/13/2022   Procedure: SUBMUCOSAL LIFTING INJECTION;  Surgeon: Harvel Quale, MD;  [facility];  Service: Gastroenterology;;   SUBMUCOSAL TATTOO INJECTION  01/13/2022   Procedure: SUBMUCOSAL TATTOO INJECTION;  Surgeon: Montez Morita, Quillian Quince, MD;  [facility]: [facility];  Service: Gastroenterology;;    Current Outpatient Medications  Medication Sig Dispense Refill   acetaminophen (TYLENOL) 500 MG tablet Take 500 mg by mouth every 6 (six) hours as needed for pain.     atorvastatin (LIPITOR) 40 MG tablet Take 40 mg by mouth daily.     cetirizine (ZYRTEC) 10 MG tablet Take 10 mg by mouth daily.     cyanocobalamin (VITAMIN B12) 1000 MCG tablet Take 2,000 mcg by mouth daily.     escitalopram (LEXAPRO) 10 MG tablet Take 10 mg by mouth daily.     lisinopril (ZESTRIL) 10 MG tablet Take 10-20 mg by mouth daily.     pantoprazole (PROTONIX) 40 MG tablet Take 1 tablet (40 mg total) by mouth 2 (two) times daily before a meal. (Patient taking differently: Take 40 mg by mouth daily.) 60 tablet 3   No current facility-administered medications for this visit.    Allergies as of 02/16/2022 - Review Complete 02/16/2022  Allergen Reaction Noted   Bactrim [sulfamethoxazole-trimethoprim] Hives 11/18/2021   Levaquin [levofloxacin] Nausea And Vomiting 09/24/2014   Penicillins Rash 11/29/2012    Family History  Problem Relation Age of Onset   Leukemia Father    Cirrhosis Brother        non alcholic cirrhosis, fatty liver    Social History   Socioeconomic History   Marital status: Divorced    Spouse  name: Not on file   Number of children: Not on file   Years of education: Not on file   Highest education level: Not on file  Occupational History   Not on file  Tobacco Use   Smoking status: Never    Passive exposure: Never   Smokeless tobacco: Never  Vaping Use   Vaping Use: Never used  Substance and Sexual Activity   Alcohol use: No   Drug use: No   Sexual activity: Never  Other Topics Concern   Not on file  Social History Narrative   Not on file   Social Determinants of Health   Financial Resource Strain: Not on file  Food Insecurity: Not on file  Transportation Needs: Not on file  Physical Activity: Not on file  Stress: Not on file  Social Connections: Not on file   Review of systems General: negative for malaise, night sweats, fever, chills, weight loss Neck: Negative for lumps, goiter, pain and significant neck swelling Resp: Negative for cough, wheezing, dyspnea at rest CV: Negative for chest pain, leg swelling, palpitations, orthopnea GI: denies melena, hematochezia, nausea, vomiting, diarrhea, constipation, dysphagia, odyonophagia, early satiety or unintentional weight loss.  MSK: Negative for joint  pain or swelling, back pain, and muscle pain. Derm: Negative for itching or rash Psych: Denies depression, anxiety, memory loss, confusion. No homicidal or suicidal ideation.  Heme: Negative for prolonged bleeding, bruising easily, and swollen nodes. Endocrine: Negative for cold or heat intolerance, polyuria, polydipsia and goiter. Neuro: negative for tremor, gait imbalance, syncope and seizures. The remainder of the review of systems is noncontributory.  Physical Exam: There were no vitals taken for this visit. General:   Alert and oriented. No distress noted. Pleasant and cooperative.  Head:  Normocephalic and atraumatic. Eyes:  Conjuctiva clear without scleral icterus. Mouth:  Oral mucosa pink and moist. Good dentition. No lesions. Heart: Normal rate and  rhythm, s1 and s2 heart sounds present.  Lungs: Clear lung sounds in all lobes. Respirations equal and unlabored. Abdomen:  +BS, soft, non-tender and non-distended. No rebound or guarding. No HSM or masses noted. Derm: No palmar erythema or jaundice Msk:  Symmetrical without gross deformities. Normal posture. Extremities:  Without edema. Neurologic:  Alert and  oriented x4 Psych:  Alert and cooperative. Normal mood and affect.  Invalid input(s): "6 MONTHS"   ASSESSMENT: Joanna Sutton is a 66 y.o. female presenting today for follow up of dysphagia and rectal bleeding.  Dysphagia has resolved since EGD with dilation of non obstruction Schatzki ring. GERD well managed with protonix '40mg'$  once daily. Notably with findings of gastric ulcer on EGD, previously taking good powder TID, no longer taking these but is taking ibuprofen a few times per week. Patient counseled in regards to importance of avoiding all NSAID type medications, especially in presence of gastric ulcers, recommend using tylenol instead. Should continue with daily PPI and reflux precautions.   No further rectal bleeding since colonoscopy. Recommend repeat Colonoscopy in April 2024 due to presence of large SSL polyp.     PLAN:  Continue PPI daily, reflux precautions 2. Avoid ALL NSAIDs  3. Repeat colonoscopy in April 2024, ASA II, ENDO 1   All questions were answered, patient verbalized understanding and is in agreement with plan as outlined above.    Follow Up: 1 year   Sion Reinders L. Alver Sorrow, MSN, APRN, AGNP-C Adult-Gerontology Nurse Practitioner [facility]  I have reviewed the note and agree with the APP's assessment as described in this progress note  Maylon Peppers, MD Gastroenterology and Hepatology [facility]

## 2022-02-16 NOTE — Patient Instructions (Signed)
We will plan to repeat colonoscopy in April Please continue protonix daily As discussed, due to history of ulcers in your stomach, Please avoid NSAIDs (advil, aleve, naproxen, goody powder, ibuprofen) as these can be very hard on your GI tract, causing inflammation, bleeding and damage to the lining of your GI tract.  Follow up 1 year

## 2022-02-17 ENCOUNTER — Ambulatory Visit (INDEPENDENT_AMBULATORY_CARE_PROVIDER_SITE_OTHER): Payer: PPO | Admitting: Gastroenterology

## 2022-02-24 DIAGNOSIS — J069 Acute upper respiratory infection, unspecified: Secondary | ICD-10-CM | POA: Diagnosis not present

## 2022-02-24 DIAGNOSIS — R0981 Nasal congestion: Secondary | ICD-10-CM | POA: Diagnosis not present

## 2022-02-24 DIAGNOSIS — Z299 Encounter for prophylactic measures, unspecified: Secondary | ICD-10-CM | POA: Diagnosis not present

## 2022-03-04 DIAGNOSIS — I10 Essential (primary) hypertension: Secondary | ICD-10-CM | POA: Diagnosis not present

## 2022-03-22 ENCOUNTER — Emergency Department (HOSPITAL_COMMUNITY): Payer: PPO

## 2022-03-22 ENCOUNTER — Other Ambulatory Visit: Payer: Self-pay

## 2022-03-22 ENCOUNTER — Encounter (HOSPITAL_COMMUNITY): Payer: Self-pay | Admitting: *Deleted

## 2022-03-22 ENCOUNTER — Inpatient Hospital Stay (HOSPITAL_COMMUNITY)
Admission: EM | Admit: 2022-03-22 | Discharge: 2022-04-04 | DRG: 391 | Disposition: A | Discharge status: Home Health | Payer: PPO | Attending: Family Medicine | Admitting: Family Medicine

## 2022-03-22 DIAGNOSIS — A419 Sepsis, unspecified organism: Secondary | ICD-10-CM | POA: Diagnosis not present

## 2022-03-22 DIAGNOSIS — K6389 Other specified diseases of intestine: Secondary | ICD-10-CM | POA: Diagnosis not present

## 2022-03-22 DIAGNOSIS — E876 Hypokalemia: Secondary | ICD-10-CM | POA: Insufficient documentation

## 2022-03-22 DIAGNOSIS — R5381 Other malaise: Secondary | ICD-10-CM | POA: Diagnosis present

## 2022-03-22 DIAGNOSIS — K5904 Chronic idiopathic constipation: Secondary | ICD-10-CM

## 2022-03-22 DIAGNOSIS — R188 Other ascites: Secondary | ICD-10-CM | POA: Diagnosis present

## 2022-03-22 DIAGNOSIS — I10 Essential (primary) hypertension: Secondary | ICD-10-CM | POA: Diagnosis not present

## 2022-03-22 DIAGNOSIS — Z8711 Personal history of peptic ulcer disease: Secondary | ICD-10-CM

## 2022-03-22 DIAGNOSIS — Z9049 Acquired absence of other specified parts of digestive tract: Secondary | ICD-10-CM

## 2022-03-22 DIAGNOSIS — K567 Ileus, unspecified: Secondary | ICD-10-CM | POA: Diagnosis not present

## 2022-03-22 DIAGNOSIS — K2100 Gastro-esophageal reflux disease with esophagitis, without bleeding: Secondary | ICD-10-CM | POA: Diagnosis present

## 2022-03-22 DIAGNOSIS — I959 Hypotension, unspecified: Secondary | ICD-10-CM | POA: Diagnosis not present

## 2022-03-22 DIAGNOSIS — Z8719 Personal history of other diseases of the digestive system: Secondary | ICD-10-CM

## 2022-03-22 DIAGNOSIS — K5720 Diverticulitis of large intestine with perforation and abscess without bleeding: Secondary | ICD-10-CM | POA: Diagnosis present

## 2022-03-22 DIAGNOSIS — E7800 Pure hypercholesterolemia, unspecified: Secondary | ICD-10-CM | POA: Diagnosis not present

## 2022-03-22 DIAGNOSIS — Z1152 Encounter for screening for COVID-19: Secondary | ICD-10-CM | POA: Diagnosis not present

## 2022-03-22 DIAGNOSIS — F32A Depression, unspecified: Secondary | ICD-10-CM | POA: Insufficient documentation

## 2022-03-22 DIAGNOSIS — R1032 Left lower quadrant pain: Secondary | ICD-10-CM

## 2022-03-22 DIAGNOSIS — D649 Anemia, unspecified: Secondary | ICD-10-CM | POA: Diagnosis not present

## 2022-03-22 DIAGNOSIS — K651 Peritoneal abscess: Secondary | ICD-10-CM | POA: Diagnosis present

## 2022-03-22 DIAGNOSIS — K529 Noninfective gastroenteritis and colitis, unspecified: Secondary | ICD-10-CM | POA: Diagnosis not present

## 2022-03-22 DIAGNOSIS — R1084 Generalized abdominal pain: Secondary | ICD-10-CM | POA: Diagnosis not present

## 2022-03-22 DIAGNOSIS — Z79899 Other long term (current) drug therapy: Secondary | ICD-10-CM | POA: Diagnosis not present

## 2022-03-22 DIAGNOSIS — Z806 Family history of leukemia: Secondary | ICD-10-CM | POA: Diagnosis not present

## 2022-03-22 DIAGNOSIS — K760 Fatty (change of) liver, not elsewhere classified: Secondary | ICD-10-CM | POA: Diagnosis not present

## 2022-03-22 DIAGNOSIS — R197 Diarrhea, unspecified: Secondary | ICD-10-CM | POA: Diagnosis not present

## 2022-03-22 DIAGNOSIS — K56600 Partial intestinal obstruction, unspecified as to cause: Secondary | ICD-10-CM | POA: Diagnosis not present

## 2022-03-22 DIAGNOSIS — R1030 Lower abdominal pain, unspecified: Secondary | ICD-10-CM | POA: Diagnosis not present

## 2022-03-22 DIAGNOSIS — N281 Cyst of kidney, acquired: Secondary | ICD-10-CM | POA: Diagnosis not present

## 2022-03-22 DIAGNOSIS — K219 Gastro-esophageal reflux disease without esophagitis: Secondary | ICD-10-CM | POA: Diagnosis present

## 2022-03-22 DIAGNOSIS — Z880 Allergy status to penicillin: Secondary | ICD-10-CM

## 2022-03-22 LAB — LIPASE, BLOOD: Lipase: 42 U/L (ref 11–51)

## 2022-03-22 LAB — CBC WITH DIFFERENTIAL/PLATELET
Abs Immature Granulocytes: 0.03 10*3/uL (ref 0.00–0.07)
Basophils Absolute: 0 10*3/uL (ref 0.0–0.1)
Basophils Relative: 0 %
Eosinophils Absolute: 0 10*3/uL (ref 0.0–0.5)
Eosinophils Relative: 0 %
HCT: 36.8 % (ref 36.0–46.0)
Hemoglobin: 12 g/dL (ref 12.0–15.0)
Immature Granulocytes: 0 %
Lymphocytes Relative: 5 %
Lymphs Abs: 0.7 10*3/uL (ref 0.7–4.0)
MCH: 31.3 pg (ref 26.0–34.0)
MCHC: 32.6 g/dL (ref 30.0–36.0)
MCV: 96.1 fL (ref 80.0–100.0)
Monocytes Absolute: 0.5 10*3/uL (ref 0.1–1.0)
Monocytes Relative: 5 %
Neutro Abs: 10.9 10*3/uL — ABNORMAL HIGH (ref 1.7–7.7)
Neutrophils Relative %: 90 %
Platelets: 188 10*3/uL (ref 150–400)
RBC: 3.83 MIL/uL — ABNORMAL LOW (ref 3.87–5.11)
RDW: 13.9 % (ref 11.5–15.5)
WBC: 12.1 10*3/uL — ABNORMAL HIGH (ref 4.0–10.5)
nRBC: 0 % (ref 0.0–0.2)

## 2022-03-22 LAB — COMPREHENSIVE METABOLIC PANEL
ALT: 14 U/L (ref 0–44)
AST: 17 U/L (ref 15–41)
Albumin: 4 g/dL (ref 3.5–5.0)
Alkaline Phosphatase: 47 U/L (ref 38–126)
Anion gap: 6 (ref 5–15)
BUN: 14 mg/dL (ref 8–23)
CO2: 23 mmol/L (ref 22–32)
Calcium: 8.9 mg/dL (ref 8.9–10.3)
Chloride: 111 mmol/L (ref 98–111)
Creatinine, Ser: 0.74 mg/dL (ref 0.44–1.00)
GFR, Estimated: >60 mL/min (ref >60)
Glucose, Bld: 116 mg/dL — ABNORMAL HIGH (ref 70–99)
Potassium: 3.7 mmol/L (ref 3.5–5.1)
Sodium: 140 mmol/L (ref 135–145)
Total Bilirubin: 0.5 mg/dL (ref 0.3–1.2)
Total Protein: 7.1 g/dL (ref 6.5–8.1)

## 2022-03-22 LAB — URINALYSIS, ROUTINE W REFLEX MICROSCOPIC
Bacteria, UA: NONE SEEN
Bilirubin Urine: NEGATIVE
Glucose, UA: NEGATIVE mg/dL
Ketones, ur: 20 mg/dL — AB
Leukocytes,Ua: NEGATIVE
Nitrite: NEGATIVE
Protein, ur: 30 mg/dL — AB
Specific Gravity, Urine: 1.026 (ref 1.005–1.030)
pH: 5 (ref 5.0–8.0)

## 2022-03-22 LAB — LACTIC ACID, PLASMA
Lactic Acid, Venous: 0.5 mmol/L (ref 0.5–1.9)
Lactic Acid, Venous: 0.6 mmol/L (ref 0.5–1.9)

## 2022-03-22 LAB — APTT: aPTT: 35 seconds (ref 24–36)

## 2022-03-22 LAB — RESP PANEL BY RT-PCR (RSV, FLU A&B, COVID)  RVPGX2
Influenza A by PCR: NEGATIVE
Influenza B by PCR: NEGATIVE
Resp Syncytial Virus by PCR: NEGATIVE
SARS Coronavirus 2 by RT PCR: NEGATIVE

## 2022-03-22 LAB — PROTIME-INR
INR: 1.3 — ABNORMAL HIGH (ref 0.8–1.2)
Prothrombin Time: 15.8 seconds — ABNORMAL HIGH (ref 11.4–15.2)

## 2022-03-22 MED ORDER — ESCITALOPRAM OXALATE 10 MG PO TABS
10.0000 mg | ORAL_TABLET | Freq: Every day | ORAL | Status: DC
  Administered 2022-03-22 – 2022-04-04 (×13): 10 mg via ORAL
  Filled 2022-03-22 (×15): qty 1

## 2022-03-22 MED ORDER — IOHEXOL 300 MG/ML  SOLN
100.0000 mL | Freq: Once | INTRAMUSCULAR | Status: AC | PRN
  Administered 2022-03-22: 100 mL via INTRAVENOUS

## 2022-03-22 MED ORDER — MORPHINE SULFATE (PF) 4 MG/ML IV SOLN
4.0000 mg | Freq: Once | INTRAVENOUS | Status: AC
  Administered 2022-03-22: 4 mg via INTRAVENOUS
  Filled 2022-03-22: qty 1

## 2022-03-22 MED ORDER — POLYETHYLENE GLYCOL 3350 17 G PO PACK: 17.0000 g | PACK | Freq: Every day | ORAL | 0 refills | Status: DC

## 2022-03-22 MED ORDER — ONDANSETRON HCL 4 MG/2ML IJ SOLN
4.0000 mg | Freq: Four times a day (QID) | INTRAMUSCULAR | Status: DC | PRN
  Administered 2022-03-25 – 2022-03-28 (×3): 4 mg via INTRAVENOUS
  Filled 2022-03-22 (×3): qty 2

## 2022-03-22 MED ORDER — CEFDINIR 300 MG PO CAPS: 600.0000 mg | ORAL_CAPSULE | Freq: Every day | ORAL | 0 refills | Status: DC

## 2022-03-22 MED ORDER — SODIUM CHLORIDE 0.9 % IV BOLUS
1000.0000 mL | Freq: Once | INTRAVENOUS | Status: AC
  Administered 2022-03-22: 1000 mL via INTRAVENOUS

## 2022-03-22 MED ORDER — MAGNESIUM CITRATE PO SOLN: 1.0000 | Freq: Once | ORAL | 0 refills | Status: DC

## 2022-03-22 MED ORDER — ENOXAPARIN SODIUM 40 MG/0.4ML IJ SOSY
40.0000 mg | PREFILLED_SYRINGE | INTRAMUSCULAR | Status: DC
  Administered 2022-03-22 – 2022-03-31 (×10): 40 mg via SUBCUTANEOUS
  Filled 2022-03-22 (×11): qty 0.4

## 2022-03-22 MED ORDER — SODIUM CHLORIDE 0.9 % IV SOLN
1.0000 g | Freq: Once | INTRAVENOUS | Status: AC
  Administered 2022-03-22: 1 g via INTRAVENOUS
  Filled 2022-03-22: qty 10

## 2022-03-22 MED ORDER — METRONIDAZOLE 500 MG PO TABS: 500.0000 mg | ORAL_TABLET | Freq: Three times a day (TID) | ORAL | 0 refills | Status: DC

## 2022-03-22 MED ORDER — SODIUM CHLORIDE 0.9 % IV SOLN
1.0000 g | INTRAVENOUS | Status: DC
  Administered 2022-03-23 – 2022-03-28 (×6): 1 g via INTRAVENOUS
  Filled 2022-03-22 (×6): qty 10

## 2022-03-22 MED ORDER — ATORVASTATIN CALCIUM 40 MG PO TABS
40.0000 mg | ORAL_TABLET | Freq: Every day | ORAL | Status: DC
  Administered 2022-03-22 – 2022-04-04 (×13): 40 mg via ORAL
  Filled 2022-03-22 (×14): qty 1

## 2022-03-22 MED ORDER — KETOROLAC TROMETHAMINE 15 MG/ML IJ SOLN
15.0000 mg | Freq: Once | INTRAMUSCULAR | Status: AC
  Administered 2022-03-22: 15 mg via INTRAVENOUS
  Filled 2022-03-22: qty 1

## 2022-03-22 MED ORDER — OXYCODONE HCL 5 MG PO TABS
5.0000 mg | ORAL_TABLET | ORAL | Status: DC | PRN
  Administered 2022-03-22 – 2022-03-26 (×12): 5 mg via ORAL
  Filled 2022-03-22 (×12): qty 1

## 2022-03-22 MED ORDER — ONDANSETRON HCL 4 MG PO TABS
4.0000 mg | ORAL_TABLET | Freq: Four times a day (QID) | ORAL | Status: DC | PRN
  Administered 2022-03-22: 4 mg via ORAL
  Filled 2022-03-22: qty 1

## 2022-03-22 MED ORDER — PANTOPRAZOLE SODIUM 40 MG PO TBEC
40.0000 mg | DELAYED_RELEASE_TABLET | Freq: Every day | ORAL | Status: DC
  Administered 2022-03-22 – 2022-04-04 (×13): 40 mg via ORAL
  Filled 2022-03-22 (×14): qty 1

## 2022-03-22 MED ORDER — LACTATED RINGERS IV SOLN: INTRAVENOUS | Status: AC

## 2022-03-22 NOTE — H&P (Addendum)
History and Physical    Joanna Sutton QJJ:[id number] DOB: 1955-06-26 DOA: 03/22/2022  I have briefly reviewed the patient's prior medical records in [place]  PCP: Glenda Chroman, MD  Patient coming from: home  Chief Complaint: Abdominal pain, watery diarrhea, nausea  HPI: Joanna Sutton is a 66 y.o. female with medical history significant of HTN, HLD, depression, GERD, comes to the hospital with complaints of abdominal pain, nausea, diarrhea for the past 2 days.  She is not sure, but feels that it may have been related to eating out the day prior to admission.  She went to a [place] where she had Shrimp fajita, and tells me she has gotten sick from that place before but not always.  She has been followed by gastroenterology for rectal bleeding and dysphagia, recently in the past October she had a colonoscopy with several polyps removed and diverticulosis, and and upper endoscopy with a dilated nonobstructive Schatzki ring as well as nonbleeding gastric ulcers with a clean ulcer base.  Currently reports abdominal pain, throughout, with nausea and poor p.o. intake.  She denies any fever or chills.  She denies any blood in the stools and tells me that her stools are just pure liquid.  She denies any vomiting.  No recent medication changes or antibiotics  ED Course: In the ED she was found to have a temp of 101.1, mild leukocytosis with white count of 12.1, and a CT scan of the abdomen and pelvis showing mild colitis of the descending and sigmoid colon, with large amount of stool noted, potentially infectious and stercoral colitis.  She was given ceftriaxone, fluids and we are asked to admit  Review of Systems: All systems reviewed, and apart from HPI, all negative  Past Medical History:  Diagnosis Date   Depression    Diarrhea    GERD (gastroesophageal reflux disease)    High cholesterol    Hypertension     Past Surgical History:  Procedure Laterality Date    APPENDECTOMY     AUGMENTATION MAMMAPLASTY     BIOPSY  01/13/2022   Procedure: BIOPSY;  Surgeon: Harvel Quale, MD;  [facility];  Service: Gastroenterology;;   BREAST ENHANCEMENT SURGERY     CESAREAN SECTION     CHOLECYSTECTOMY     COLONOSCOPY WITH PROPOFOL N/A 01/13/2022   Procedure: COLONOSCOPY WITH PROPOFOL;  Surgeon: Harvel Quale, MD;  [facility];  Service: Gastroenterology;  Laterality: N/A;  1030 am ASA 2   ESOPHAGEAL DILATION N/A 09/27/2014   Procedure: ESOPHAGEAL DILATION;  Surgeon: Rogene Houston, MD;  [facility];  Service: Endoscopy;  Laterality: N/A;   ESOPHAGOGASTRODUODENOSCOPY N/A 09/27/2014   Procedure: ESOPHAGOGASTRODUODENOSCOPY (EGD);  Surgeon: Rogene Houston, MD;  [facility];  Service: Endoscopy;  Laterality: N/A;  930   ESOPHAGOGASTRODUODENOSCOPY N/A 01/03/2015   Procedure: ESOPHAGOGASTRODUODENOSCOPY (EGD);  Surgeon: Rogene Houston, MD;  [facility];  Service: Endoscopy;  Laterality: N/A;  300   ESOPHAGOGASTRODUODENOSCOPY (EGD) WITH PROPOFOL N/A 01/13/2022   Procedure: ESOPHAGOGASTRODUODENOSCOPY (EGD) WITH PROPOFOL;  Surgeon: Harvel Quale, MD;  [facility];  Service: Gastroenterology;  Laterality: N/A;   NASAL SINUS SURGERY     PATELLA FRACTURE SURGERY Left    around 2016   POLYPECTOMY  01/13/2022   Procedure: POLYPECTOMY;  Surgeon: Harvel Quale, MD;  [facility];  Service: Gastroenterology;;   [place] INJECTION  01/13/2022   Procedure:  SUBMUCOSAL LIFTING INJECTION;  Surgeon: Montez Morita, Quillian Quince, MD;  [facility];  Service: Gastroenterology;;   SUBMUCOSAL TATTOO INJECTION  01/13/2022   Procedure: SUBMUCOSAL TATTOO INJECTION;  Surgeon: Montez Morita, Quillian Quince, MD;  [facility];  Service: Gastroenterology;;     reports that she has never smoked. She has never been exposed to tobacco smoke.  She has never used smokeless tobacco. She reports that she does not drink alcohol and does not use drugs.  Allergies  Allergen Reactions   Bactrim [Sulfamethoxazole-Trimethoprim] Hives   Levaquin [Levofloxacin] Nausea And Vomiting   Penicillins Rash    Family History  Problem Relation Age of Onset   Leukemia Father    Cirrhosis Brother        non alcholic cirrhosis, fatty liver    Prior to Admission medications   Medication Sig Start Date End Date Taking? Authorizing Provider  cefdinir (OMNICEF) 300 MG capsule Take 2 capsules (600 mg total) by mouth daily for 7 days. 03/22/22 03/29/22 Yes Beatty, Celeste A, PA-C  magnesium citrate SOLN Take 296 mLs (1 Bottle total) by mouth once for 1 dose. 03/22/22 03/22/22 Yes Beatty, Celeste A, PA-C  metroNIDAZOLE (FLAGYL) 500 MG tablet Take 1 tablet (500 mg total) by mouth 3 (three) times daily for 7 days. 03/22/22 03/29/22 Yes Beatty, Celeste A, PA-C  polyethylene glycol (MIRALAX) 17 g packet Take 17 g by mouth daily. 03/22/22  Yes Beatty, Celeste A, PA-C  acetaminophen (TYLENOL) 500 MG tablet Take 500 mg by mouth every 6 (six) hours as needed for pain.    [provider]  atorvastatin (LIPITOR) 40 MG tablet Take 40 mg by mouth daily.    [provider]  cetirizine (ZYRTEC) 10 MG tablet Take 10 mg by mouth daily.    [provider]  cyanocobalamin (VITAMIN B12) 1000 MCG tablet Take 2,000 mcg by mouth daily.    [provider]  escitalopram (LEXAPRO) 10 MG tablet Take 10 mg by mouth daily.    [provider]  lisinopril (ZESTRIL) 10 MG tablet Take 10-20 mg by mouth daily.    [provider]  pantoprazole (PROTONIX) 40 MG tablet Take 1 tablet (40 mg total) by mouth 2 (two) times daily before a meal. Patient taking differently: Take 40 mg by mouth daily. 09/27/14   Rogene Houston, MD    Physical Exam: Vitals:   03/22/22 1125 03/22/22 1127 03/22/22 1200 03/22/22 1433  BP:  128/62 126/63 137/66   Pulse:  76 82 77  Resp:  14  18  Temp:  99.2 F (37.3 C)  (S) (!) 101.1 F (38.4 C)  TempSrc:  Oral  Oral  SpO2:  97% 96% 97%  Weight: 61.2 kg     Height: '5\' 1"'$  (1.549 m)      Constitutional: NAD, calm, comfortable Eyes: PERRL, lids and conjunctivae normal ENMT: Mucous membranes are moist. Neck: normal, supple Respiratory: clear to auscultation bilaterally, no wheezing, no crackles. Normal respiratory effort.  Cardiovascular: Regular rate and rhythm, no murmurs / rubs / gallops. No extremity edema. 2+ pedal pulses.  Abdomen: Diffusely tender, no guarding or rebound, bowel sounds positive.  Note more tenderness with palpation and almost no tenderness with pressing the stethoscope alone Musculoskeletal: no clubbing / cyanosis. Normal muscle tone.  Skin: no rashes, lesions, ulcers. No induration Neurologic: CN 2-12 grossly intact. Strength 5/5 in all 4.   Labs on Admission: I have personally reviewed following labs and imaging studies  CBC: Recent Labs  Lab 03/22/22 1110  WBC 12.1*  NEUTROABS 10.9*  HGB 12.0  HCT 36.8  MCV 96.1  PLT 465   Basic Metabolic Panel: Recent Labs  Lab 03/22/22 1110  NA 140  K 3.7  CL 111  CO2 23  GLUCOSE 116*  BUN 14  CREATININE 0.74  CALCIUM 8.9   Liver Function Tests: Recent Labs  Lab 03/22/22 1110  AST 17  ALT 14  ALKPHOS 47  BILITOT 0.5  PROT 7.1  ALBUMIN 4.0   Coagulation Profile: Recent Labs  Lab 03/22/22 1532  INR 1.3*   BNP (last 3 results) No results for input(s): "PROBNP" in the last 8760 hours. CBG: No results for input(s): "GLUCAP" in the last 168 hours. Thyroid Function Tests: No results for input(s): "TSH", "T4TOTAL", "FREET4", "T3FREE", "THYROIDAB" in the last 72 hours. Urine analysis:    Component Value Date/Time   COLORURINE YELLOW 03/22/2022 [place] 03/22/2022 1152   LABSPEC 1.026 03/22/2022 1152   PHURINE 5.0 03/22/2022 1152   GLUCOSEU NEGATIVE 03/22/2022 1152   HGBUR SMALL (A)  03/22/2022 1152   BILIRUBINUR NEGATIVE 03/22/2022 1152   KETONESUR 20 (A) 03/22/2022 1152   PROTEINUR 30 (A) 03/22/2022 1152   NITRITE NEGATIVE 03/22/2022 1152   LEUKOCYTESUR NEGATIVE 03/22/2022 1152     Radiological Exams on Admission: DG Chest Port 1 View  Result Date: 03/22/2022 CLINICAL DATA:  Nausea EXAM: PORTABLE CHEST 1 VIEW COMPARISON:  CT 03/22/2022, chest x-ray report 01/15/2014 FINDINGS: Calcified bilateral breast implants. Elevation of the right diaphragm. Linear scarring or atelectasis in the left lower lung. Normal cardiac size. Aortic atherosclerosis. No pneumothorax. IMPRESSION: No active disease. Linear scarring or atelectasis in the left lower lung. Electronically Signed   By: Donavan Foil M.D.   On: 03/22/2022 15:12   CT Abdomen Pelvis W Contrast  Result Date: 03/22/2022 CLINICAL DATA:  Lower abdominal pain, nausea, and diarrhea beginning yesterday. EXAM: CT ABDOMEN AND PELVIS WITH CONTRAST TECHNIQUE: Multidetector CT imaging of the abdomen and pelvis was performed using the standard protocol following bolus administration of intravenous contrast. RADIATION DOSE REDUCTION: This exam was performed according to the departmental dose-optimization program which includes automated exposure control, adjustment of the mA and/or kV according to patient size and/or use of iterative reconstruction technique. CONTRAST:  194m OMNIPAQUE IOHEXOL 300 MG/ML  SOLN COMPARISON:  None Available. FINDINGS: Lower Chest: No acute findings. Hepatobiliary: No hepatic masses identified. Prior cholecystectomy noted. Mild biliary ductal dilatation noted, likely due to prior cholecystectomy. Pancreas: No mass or inflammatory changes. No evidence of pancreatic ductal dilatation. Spleen: Within normal limits in size and appearance. Adrenals/Urinary Tract: No suspicious masses identified. No evidence of ureteral calculi or hydronephrosis. Stomach/Bowel: Large amount colonic stool is seen. Mild colonic wall  thickening pericolonic soft tissue stranding is seen involving the descending and sigmoid colon which is distended by stool, but shows no evidence of diverticular disease. This is consistent with colitis, and differential diagnosis includes infectious etiologies and stercoral colitis. No evidence of abscess. Vascular/Lymphatic: No pathologically enlarged lymph nodes. No acute vascular findings. Aortic atherosclerotic calcification incidentally noted. Reproductive:  No mass or other significant abnormality. Other:  None. Musculoskeletal:  No suspicious bone lesions identified. IMPRESSION: Mild colitis involving the descending and sigmoid colon, with large amount of stool noted. Differential diagnosis includes infectious etiologies and stercoral colitis. Aortic Atherosclerosis (ICD10-I70.0). Electronically Signed   By: JMarlaine HindM.D.   On: 03/22/2022 12:58    EKG: Independently reviewed. Sinus rhythm  Assessment/Plan Principal problem  Mild descending and sigmoid colitis -rule out infectious causes, send C. difficile as well as GI pathogen panel.  May have been related to restaurant food that she consumed the day before.  She was started on ceftriaxone in the ER, continue until cultures are back -Continue symptomatic management  Active problems Hypertension-hold lisinopril for now  Hyperlipidemia-resume statin  PUD-as evidenced on EGD 2 months ago, continue PPI for now, if diarrhea persists this could be discontinued  Dysphagia-status post EGD/dilatation 2 months ago.  Placed on clear liquid now given GI upset  Depression-continue home medications   DVT prophylaxis: Lovenox  Code Status: Full code  Family Communication: no family at bedside Disposition Plan: home when ready  Bed Type: MedSurg Consults called: GI  Obs/Inp: Obs   Marzetta Board, MD, PhD [facility]  Contact via [email or web address]  03/22/2022, 4:07 PM

## 2022-03-22 NOTE — ED Provider Notes (Signed)
[facility] EMERGENCY DEPARTMENT Provider Note   CSN: [id number] Arrival date & time: 03/22/22  2423     History  No chief complaint on file.   Joanna Sutton is a 66 y.o. female.  Presents emergency department complaining of lower abdominal and lower back pain that started last night.  She states is much worse this morning.  She denies dysuria, she does admit to nonbloody diarrhea described as watery.  She denies fevers or chills.  No vomiting.  She has history of appendectomy and cholecystectomy in the past as well as C-section.  She had colonoscopy and endoscopy January 13, 2022.  She is found to have a known obstructing Schatzki ring and had esophageal dilatation, she had multiple gastric ulcers that were nonbleeding she had multiple colonic polyps removed.  HPI     Home Medications Prior to Admission medications   Medication Sig Start Date End Date Taking? Authorizing Provider  acetaminophen (TYLENOL) 500 MG tablet Take 500 mg by mouth every 6 (six) hours as needed for pain.    [provider]  atorvastatin (LIPITOR) 40 MG tablet Take 40 mg by mouth daily.    [provider]  cetirizine (ZYRTEC) 10 MG tablet Take 10 mg by mouth daily.    [provider]  cyanocobalamin (VITAMIN B12) 1000 MCG tablet Take 2,000 mcg by mouth daily.    [provider]  escitalopram (LEXAPRO) 10 MG tablet Take 10 mg by mouth daily.    [provider]  lisinopril (ZESTRIL) 10 MG tablet Take 10-20 mg by mouth daily.    [provider]  pantoprazole (PROTONIX) 40 MG tablet Take 1 tablet (40 mg total) by mouth 2 (two) times daily before a meal. Patient taking differently: Take 40 mg by mouth daily. 09/27/14   Rogene Houston, MD      Allergies    Bactrim [sulfamethoxazole-trimethoprim], Levaquin [levofloxacin], and Penicillins    Review of Systems   Review of Systems  Gastrointestinal:  Positive for abdominal pain.  Genitourinary:  Negative for  difficulty urinating, dysuria, frequency and hematuria.    Physical Exam Updated Vital Signs There were no vitals taken for this visit. Physical Exam Vitals and nursing note reviewed.  Constitutional:      General: She is not in acute distress.    Appearance: She is well-developed.  HENT:     Head: Normocephalic and atraumatic.     Mouth/Throat:     Mouth: Mucous membranes are moist.  Eyes:     Conjunctiva/sclera: Conjunctivae normal.  Cardiovascular:     Rate and Rhythm: Normal rate and regular rhythm.     Heart sounds: No murmur heard. Pulmonary:     Effort: Pulmonary effort is normal. No respiratory distress.     Breath sounds: Normal breath sounds.  Abdominal:     Palpations: Abdomen is soft.     Tenderness: There is abdominal tenderness in the right lower quadrant, suprapubic area and left lower quadrant. There is no right CVA tenderness or left CVA tenderness. Negative signs include Murphy's sign and Rovsing's sign.     Comments: Noticed to bilateral lower quadrants.  Nontender in right and left upper quadrants, no rebound  Musculoskeletal:        General: No swelling.     Cervical back: Neck supple.  Skin:    General: Skin is warm and dry.     Capillary Refill: Capillary refill takes less than 2 seconds.  Neurological:     Mental Status:  She is alert and oriented to person, place, and time.     Gait: Gait normal.  Psychiatric:        Mood and Affect: Mood normal.     ED Results / Procedures / Treatments   Labs (all labs ordered are listed, but only abnormal results are displayed) Labs Reviewed  CBC WITH DIFFERENTIAL/PLATELET  COMPREHENSIVE METABOLIC PANEL  LIPASE, BLOOD  URINALYSIS, ROUTINE W REFLEX MICROSCOPIC    EKG None  Radiology No results found.  Procedures .Critical Care  Performed by: Gwenevere Abbot, PA-C Authorized by: Gwenevere Abbot, PA-C   Critical care provider statement:    Critical care time (minutes):  30   Critical care was  necessary to treat or prevent imminent or life-threatening deterioration of the following conditions:  Sepsis   Critical care was time spent personally by me on the following activities:  Development of treatment plan with patient or surrogate, discussions with consultants, evaluation of patient's response to treatment, examination of patient, ordering and review of laboratory studies, ordering and review of radiographic studies, ordering and performing treatments and interventions, pulse oximetry, re-evaluation of patient's condition and review of old charts   Care discussed with: admitting provider       Medications Ordered in ED Medications - No data to display  ED Course/ Medical Decision Making/ A&P                           Medical Decision Making This patient presents to the ED for concern of nominal pain, this involves an extensive number of treatment options, and is a complaint that carries with it a high risk of complications and morbidity.  The differential diagnosis includes colitis, diverticulitis, perforation, UTI, pyelonephritis, pancreatitis, nephrolithiasis, other   Co morbidities that complicate the patient evaluation  Prepyloric ulcers   Additional history obtained:  Additional history obtained from gastroenterology note   Lab Tests:  I Ordered, and personally interpreted labs.  The pertinent results include: Leukocytosis white blood cell count 12.1, MP is reassuring,   Imaging Studies ordered:  I ordered imaging studies including ET abdomen pelvis with IV contrast I independently visualized and interpreted imaging which showed colitis, large stool burden I agree with the radiologist interpretation   Cardiac Monitoring: / EKG:  The patient was maintained on a cardiac monitor.  I personally viewed and interpreted the cardiac monitored which showed an underlying rhythm of: Normal sinus rhythm   Consultations Obtained:  I requested consultation with the  specialist sepsis,  and discussed lab and imaging findings as well as pertinent plan - they recommend: adm ission   Problem List / ED Course / Critical interventions / Medication management  Secondary to colitis-presented with severe lower abdominal pain today, initially did not present with sepsis, basic labs were ordered in addition to CT given her pain with concern for colitis versus diverticulitis versus abscess or perforation.  Patient was given IV pain medication with minimal relief.  CT came back and showed mild colitis.  I discussed the findings with the patient and the plan was for possible discharge but ordered Toradol for some additional pain control before planning on disposition.  Had ordered a dose of IV antibiotics, patient is allergic to penicillin so could not use the Unasyn.  She also has a quinolone allergy.  Spoke with the pharmacy staff for recommendations and they advised Flagyl plus a full Psorin so she was ordered IV Rocephin.  I was  informed shortly after this that patient has developed a fever, she now does meet sepsis criteria, so hospitalist consultation was ordered at this time. I ordered medication including Rocephin, Flagyl, morphine, Toradol, fluids for some pain Reevaluation of the patient after these medicines showed that the patient improved I have reviewed the patients home medicines and have made adjustments as needed   Social Determinants of Health:  Lives alone   Test / Admission - Considered:      Amount and/or Complexity of Data Reviewed Labs: ordered. Radiology: ordered.  Risk Prescription drug management.           Final Clinical Impression(s) / ED Diagnoses Final diagnoses:  None    Rx / DC Orders ED Discharge Orders     None         Darci Current 03/22/22 1531    Noemi Chapel, MD 03/30/22 [phone]

## 2022-03-22 NOTE — Sepsis Progress Note (Signed)
Elink following code sepsis °

## 2022-03-22 NOTE — ED Triage Notes (Signed)
Pt c/o lower abdominal pain, back pain, nausea, watery diarrhea that started yesterday. Denies vomiting.

## 2022-03-23 DIAGNOSIS — K5904 Chronic idiopathic constipation: Secondary | ICD-10-CM

## 2022-03-23 DIAGNOSIS — R1032 Left lower quadrant pain: Secondary | ICD-10-CM

## 2022-03-23 DIAGNOSIS — K529 Noninfective gastroenteritis and colitis, unspecified: Secondary | ICD-10-CM | POA: Diagnosis not present

## 2022-03-23 DIAGNOSIS — R197 Diarrhea, unspecified: Secondary | ICD-10-CM | POA: Diagnosis not present

## 2022-03-23 LAB — COMPREHENSIVE METABOLIC PANEL
ALT: 13 U/L (ref 0–44)
AST: 15 U/L (ref 15–41)
Albumin: 2.8 g/dL — ABNORMAL LOW (ref 3.5–5.0)
Alkaline Phosphatase: 33 U/L — ABNORMAL LOW (ref 38–126)
Anion gap: 6 (ref 5–15)
BUN: 17 mg/dL (ref 8–23)
CO2: 23 mmol/L (ref 22–32)
Calcium: 8.1 mg/dL — ABNORMAL LOW (ref 8.9–10.3)
Chloride: 110 mmol/L (ref 98–111)
Creatinine, Ser: 0.73 mg/dL (ref 0.44–1.00)
GFR, Estimated: >60 mL/min (ref >60)
Glucose, Bld: 94 mg/dL (ref 70–99)
Potassium: 3 mmol/L — ABNORMAL LOW (ref 3.5–5.1)
Sodium: 139 mmol/L (ref 135–145)
Total Bilirubin: 0.7 mg/dL (ref 0.3–1.2)
Total Protein: 5.4 g/dL — ABNORMAL LOW (ref 6.5–8.1)

## 2022-03-23 LAB — C DIFFICILE QUICK SCREEN W PCR REFLEX
C Diff antigen: NEGATIVE
C Diff interpretation: NOT DETECTED
C Diff toxin: NEGATIVE

## 2022-03-23 LAB — CBC
HCT: 30.9 % — ABNORMAL LOW (ref 36.0–46.0)
Hemoglobin: 9.9 g/dL — ABNORMAL LOW (ref 12.0–15.0)
MCH: 31.5 pg (ref 26.0–34.0)
MCHC: 32 g/dL (ref 30.0–36.0)
MCV: 98.4 fL (ref 80.0–100.0)
Platelets: 162 10*3/uL (ref 150–400)
RBC: 3.14 MIL/uL — ABNORMAL LOW (ref 3.87–5.11)
RDW: 13.8 % (ref 11.5–15.5)
WBC: 11.3 10*3/uL — ABNORMAL HIGH (ref 4.0–10.5)
nRBC: 0 % (ref 0.0–0.2)

## 2022-03-23 LAB — HIV ANTIBODY (ROUTINE TESTING W REFLEX): HIV Screen 4th Generation wRfx: NONREACTIVE

## 2022-03-23 MED ORDER — VITAMIN B-12 1000 MCG PO TABS
2000.0000 ug | ORAL_TABLET | Freq: Every day | ORAL | Status: DC
  Administered 2022-03-23 – 2022-04-04 (×12): 2000 ug via ORAL
  Filled 2022-03-23 (×13): qty 2

## 2022-03-23 MED ORDER — LORATADINE 10 MG PO TABS
10.0000 mg | ORAL_TABLET | Freq: Every day | ORAL | Status: DC
  Administered 2022-03-23 – 2022-04-04 (×12): 10 mg via ORAL
  Filled 2022-03-23 (×13): qty 1

## 2022-03-23 MED ORDER — POTASSIUM CHLORIDE CRYS ER 20 MEQ PO TBCR
30.0000 meq | EXTENDED_RELEASE_TABLET | ORAL | Status: AC
  Administered 2022-03-23 (×3): 30 meq via ORAL
  Filled 2022-03-23 (×3): qty 2

## 2022-03-23 MED ORDER — ACETAMINOPHEN 500 MG PO TABS
500.0000 mg | ORAL_TABLET | Freq: Four times a day (QID) | ORAL | Status: DC | PRN
  Administered 2022-03-23: 500 mg via ORAL
  Filled 2022-03-23: qty 1

## 2022-03-23 MED ORDER — LACTATED RINGERS IV SOLN: INTRAVENOUS | Status: AC

## 2022-03-23 MED ORDER — POLYETHYLENE GLYCOL 3350 17 G PO PACK
17.0000 g | PACK | Freq: Every day | ORAL | Status: DC
  Administered 2022-03-23 – 2022-03-27 (×3): 17 g via ORAL
  Filled 2022-03-23 (×5): qty 1

## 2022-03-23 MED ORDER — METRONIDAZOLE 500 MG/100ML IV SOLN
500.0000 mg | Freq: Two times a day (BID) | INTRAVENOUS | Status: DC
  Administered 2022-03-23 – 2022-03-29 (×12): 500 mg via INTRAVENOUS
  Filled 2022-03-23 (×12): qty 100

## 2022-03-23 NOTE — Care Management Obs Status (Signed)
[place] NOTIFICATION   Patient Details  Name: Joanna Sutton MRN: [id number] Date of Birth: 1955-11-05   Medicare Observation Status Notification Given:  Other (see comment) (copy mailed to address on file, unable to reach by phone, patient on isolation)    Tommy Medal 03/23/2022, 4:02 PM

## 2022-03-23 NOTE — TOC Progression Note (Signed)
Transition of Care [facility]) - Progression Note    Patient Details  Name: Joanna Sutton MRN: [id number] Date of Birth: Sep 19, 1955  Transition of Care [facility]) CM/SW Contact  Salome Arnt, [place] Phone Number: 03/23/2022, 10:35 AM  Clinical Narrative:   Transition of Care [facility]) Screening Note   Patient Details  Name: Joanna Sutton Date of Birth: 1955/04/24   Transition of Care [facility]) CM/SW Contact:    Salome Arnt, [place] Phone Number: 03/23/2022, 10:35 AM    Transition of Care Department [facility]) has reviewed patient and no TOC needs have been identified at this time. We will continue to monitor patient advancement through interdisciplinary progression rounds. If new patient transition needs arise, please place a TOC consult.            Expected Discharge Plan and Services                                                 Social Determinants of Health (SDOH) Interventions    Readmission Risk Interventions     No data to display

## 2022-03-23 NOTE — Consult Note (Signed)
Gastroenterology Consult   Referring Provider: Dr. Marzetta Board Primary Care Physician:  Glenda Chroman, MD Primary Gastroenterologist:  Dr. Jenetta Downer   Patient ID: Joanna Sutton; [id number]; 06/03/1955   Admit date: 03/22/2022  LOS: 0 days   Date of Consultation: 03/23/2022  Reason for Consultation:  Colitis   History of Present Illness   Joanna Sutton is a 66 y.o. year old female with past medical history of depression, hypercholesterolemia, HTN, esophagitis, PUD on EGD Oct 2023, multiple polyps in Oct 2023, presenting to the ED with abdominal pain, nausea, and diarrhea.  In the ED, temp was 101.1, WBC count 12.1. Hgb 12.0. LFTs normal. Blood cultures no growth thus far. Normal lactic acid. Urine culture pending. CT abd/pelvis with contrast noting mild colitis involving the descending and sigmoid colon, with large amount of stool noted. Differentials including infectious vs stercoral colitis.   Notes acute onset of diarrhea on Saturday. Watery stools were every few minutes. Abdominal cramping. No rectal bleeding. On antbiotics about 2 weeks ago. No sick contacts. Felt nauseated but no vomiting.   Ate at a [place] and had shrimp fajita on Friday. Pain same as admission. Nausea improved. Ate small amount of broth and grape juice this morning. Sipping on ginger ale. No further stools since presenting to the ED. Occasional BC powders. Feels like she needs to have a BM. Feels bloated. No further loose stools since admission. Baseline bowel habits usually one BM per day.     Last Colonoscopy:01/13/22 One 15 to 20 mm polyp in the distal ascending colon-SSL Resected and retrieved. Injected. Clips were placed. Tattooed. - Three 3 to 6 mm polyps in the transverse colon and in the ascending colon (TAx3) - Diverticulosis in the sigmoid colon and in the ascending colon. - Non-bleeding internal hemorrhoids. Last Endoscopy: 01/13/22 White nummular lesions in esophageal  mucosa. Biopsied-mild chronic esophagitis - 1 cm hiatal hernia. - Non-obstructing Schatzki ring. Dilated. - Gastroesophageal flap valve classified as Hill Grade IV (no fold, wide open lumen, hiatal hernia present). - Non-bleeding gastric ulcers with a clean ulcer base (Forrest Class III). Biopsied-normal - Normal examined duodenum.   Recommendations:  Repeat colonoscopy in April 2024  Past Medical History:  Diagnosis Date   Depression    Diarrhea    GERD (gastroesophageal reflux disease)    High cholesterol    Hypertension     Past Surgical History:  Procedure Laterality Date   APPENDECTOMY     AUGMENTATION MAMMAPLASTY     BIOPSY  01/13/2022   Procedure: BIOPSY;  Surgeon: Harvel Quale, MD;  [facility]: [facility];  Service: Gastroenterology;;   BREAST ENHANCEMENT SURGERY     CESAREAN SECTION     CHOLECYSTECTOMY     COLONOSCOPY WITH PROPOFOL N/A 01/13/2022   Procedure: COLONOSCOPY WITH PROPOFOL;  Surgeon: Harvel Quale, MD;  Location: [facility];  Service: Gastroenterology;  Laterality: N/A;  1030 am ASA 2   ESOPHAGEAL DILATION N/A 09/27/2014   Procedure: ESOPHAGEAL DILATION;  Surgeon: Rogene Houston, MD;  [facility]: [facility];  Service: Endoscopy;  Laterality: N/A;   ESOPHAGOGASTRODUODENOSCOPY N/A 09/27/2014   Procedure: ESOPHAGOGASTRODUODENOSCOPY (EGD);  Surgeon: Rogene Houston, MD;  [facility]: [facility];  Service: Endoscopy;  Laterality: N/A;  930   ESOPHAGOGASTRODUODENOSCOPY N/A 01/03/2015   Procedure: ESOPHAGOGASTRODUODENOSCOPY (EGD);  Surgeon: Rogene Houston, MD;  [facility]: [facility];  Service: Endoscopy;  Laterality: N/A;  300   ESOPHAGOGASTRODUODENOSCOPY (EGD) WITH PROPOFOL N/A  01/13/2022   Procedure: ESOPHAGOGASTRODUODENOSCOPY (EGD) WITH PROPOFOL;  Surgeon: Harvel Quale, MD;  [facility]: [facility];  Service: Gastroenterology;  Laterality: N/A;   NASAL SINUS SURGERY     PATELLA FRACTURE SURGERY Left    around  2016   POLYPECTOMY  01/13/2022   Procedure: POLYPECTOMY;  Surgeon: Harvel Quale, MD;  [facility]: [facility];  Service: Gastroenterology;;   SUBMUCOSAL LIFTING INJECTION  01/13/2022   Procedure: SUBMUCOSAL LIFTING INJECTION;  Surgeon: Harvel Quale, MD;  [facility];  Service: Gastroenterology;;   SUBMUCOSAL TATTOO INJECTION  01/13/2022   Procedure: SUBMUCOSAL TATTOO INJECTION;  Surgeon: Harvel Quale, MD;  [facility]: [facility];  Service: Gastroenterology;;    Prior to Admission medications   Medication Sig Start Date End Date Taking? Authorizing Provider  acetaminophen (TYLENOL) 500 MG tablet Take 500 mg by mouth every 6 (six) hours as needed for pain.   Yes [provider]  atorvastatin (LIPITOR) 40 MG tablet Take 40 mg by mouth daily.   Yes [provider]  augmented betamethasone dipropionate (DIPROLENE-AF) 0.05 % cream Apply 1 Application topically 2 (two) times daily.   Yes [provider]  benzonatate (TESSALON) 200 MG capsule Take 200 mg by mouth 3 (three) times daily as needed. 02/24/22  Yes [provider]  cefdinir (OMNICEF) 300 MG capsule Take 2 capsules (600 mg total) by mouth daily for 7 days. 03/22/22 03/29/22 Yes Beatty, Celeste A, PA-C  cetirizine (ZYRTEC) 10 MG tablet Take 10 mg by mouth daily.   Yes [provider]  cyanocobalamin (VITAMIN B12) 1000 MCG tablet Take 2,000 mcg by mouth daily.   Yes [provider]  escitalopram (LEXAPRO) 10 MG tablet Take 10 mg by mouth daily.   Yes [provider]  lisinopril (ZESTRIL) 10 MG tablet Take 10-20 mg by mouth daily.   Yes [provider]  metroNIDAZOLE (FLAGYL) 500 MG tablet Take 1 tablet (500 mg total) by mouth 3 (three) times daily for 7 days. 03/22/22 03/29/22 Yes Beatty, Celeste A, PA-C  pantoprazole (PROTONIX) 40 MG tablet Take 1 tablet (40 mg total) by mouth 2 (two) times daily before a meal. Patient  taking differently: Take 40 mg by mouth daily. 09/27/14  Yes Rehman, Mechele Dawley, MD  polyethylene glycol (MIRALAX) 17 g packet Take 17 g by mouth daily. 03/22/22  Yes Beatty, Celeste A, PA-C  potassium chloride (KLOR-CON) 10 MEQ tablet Take 10 mEq by mouth daily as needed. 11/24/21  Yes [provider]  tretinoin (RETIN-A) 0.025 % cream Apply 1 Application topically at bedtime.   Yes [provider]  LORazepam (ATIVAN) 0.5 MG tablet Take 0.5 mg by mouth at bedtime as needed for sleep.    [provider]    Current Facility-Administered Medications  Medication Dose Route Frequency Provider Last Rate Last Admin   acetaminophen (TYLENOL) tablet 500 mg  500 mg Oral Q6H PRN Caren Griffins, MD   500 mg at 03/23/22 0535   atorvastatin (LIPITOR) tablet 40 mg  40 mg Oral Daily Caren Griffins, MD   40 mg at 03/22/22 1657   cefTRIAXone (ROCEPHIN) 1 g in sodium chloride 0.9 % 100 mL IVPB  1 g Intravenous Q24H Gherghe, Vella Redhead, MD       cyanocobalamin (VITAMIN B12) tablet 2,000 mcg  2,000 mcg Oral Daily Gherghe, Costin M, MD       enoxaparin (LOVENOX) injection 40 mg  40 mg Subcutaneous Q24H Caren Griffins, MD  40 mg at 03/22/22 1736   escitalopram (LEXAPRO) tablet 10 mg  10 mg Oral Daily Caren Griffins, MD   10 mg at 03/22/22 1657   loratadine (CLARITIN) tablet 10 mg  10 mg Oral Daily Caren Griffins, MD       ondansetron [facility]) tablet 4 mg  4 mg Oral Q6H PRN Caren Griffins, MD   4 mg at 03/22/22 1657   Or   ondansetron (ZOFRAN) injection 4 mg  4 mg Intravenous Q6H PRN Caren Griffins, MD       oxyCODONE (Oxy IR/ROXICODONE) immediate release tablet 5 mg  5 mg Oral Q4H PRN Caren Griffins, MD   5 mg at 03/23/22 0535   pantoprazole (PROTONIX) EC tablet 40 mg  40 mg Oral Daily Caren Griffins, MD   40 mg at 03/22/22 1657    Allergies as of 03/22/2022 - Review Complete 03/22/2022  Allergen Reaction Noted   Bactrim [sulfamethoxazole-trimethoprim] Hives  11/18/2021   Levaquin [levofloxacin] Nausea And Vomiting 09/24/2014   Penicillins Rash 11/29/2012    Family History  Problem Relation Age of Onset   Leukemia Father    Cirrhosis Brother        non alcholic cirrhosis, fatty liver    Social History   Socioeconomic History   Marital status: Divorced    Spouse name: Not on file   Number of children: Not on file   Years of education: Not on file   Highest education level: Not on file  Occupational History   Not on file  Tobacco Use   Smoking status: Never    Passive exposure: Never   Smokeless tobacco: Never  Vaping Use   Vaping Use: Never used  Substance and Sexual Activity   Alcohol use: No   Drug use: No   Sexual activity: Never  Other Topics Concern   Not on file  Social History Narrative   Not on file   Social Determinants of Health   Financial Resource Strain: Not on file  Food Insecurity: No Food Insecurity (03/23/2022)   Hunger Vital Sign    Worried About Running Out of Food in the Last Year: Never true    Ran Out of Food in the Last Year: Never true  Transportation Needs: No Transportation Needs (03/23/2022)   PRAPARE - Hydrologist (Medical): No    Lack of Transportation (Non-Medical): No  Physical Activity: Not on file  Stress: Not on file  Social Connections: Not on file  Intimate Partner Violence: Not At Risk (03/23/2022)   Humiliation, Afraid, Rape, and Kick questionnaire    Fear of Current or Ex-Partner: No    Emotionally Abused: No    Physically Abused: No    Sexually Abused: No     Review of Systems   Gen: see HPI CV: Denies chest pain, heart palpitations, syncope, edema  Resp: Denies shortness of breath with rest, cough, wheezing, coughing up blood, and pleurisy. GI: see HPI GU : Denies urinary burning, blood in urine, urinary frequency, and urinary incontinence. MS: Denies joint pain, limitation of movement, swelling, cramps, and atrophy.  Derm: Denies rash,  itching, dry skin, hives. Psych: Denies depression, anxiety, memory loss, hallucinations, and confusion. Heme: Denies bruising or bleeding Neuro:  Denies any headaches, dizziness, paresthesias, shaking  Physical Exam   Vital Signs in last 24 hours: Temp:  [98.2 F (36.8 C)-101.5 F (38.6 C)] 98.2 F (36.8 C) (12/18 0907) Pulse Rate:  [64-95] 64 (  12/18 0907) Resp:  [14-26] 16 (12/18 0907) BP: (81-137)/(48-82) 82/55 (12/18 7341) Repeat 97/50 at time of consultation.  SpO2:  [94 %-100 %] 99 % (12/18 0907) Weight:  [61.2 kg-62.8 kg] 62.8 kg (12/18 0105) Last BM Date : 03/23/22  General:   Alert,  Well-developed, well-nourished, pleasant and cooperative in NAD Head:  Normocephalic and atraumatic. Eyes:  Sclera clear, no icterus.   Ears:  Normal auditory acuity. Lungs:  Clear throughout to auscultation.    Heart:  S1 S2 present without murmurs Abdomen:  Soft, TTP mildly LLQ and nondistended. No masses, hepatosplenomegaly or hernias noted. Normal bowel sounds, without guarding, and without rebound.   Rectal: deferred   Msk:  Symmetrical without gross deformities. Normal posture. Extremities:  Without edema. Neurologic:  Alert and  oriented x4. Skin:  Intact without significant lesions or rashes. Psych:  Alert and cooperative. Normal mood and affect.  Intake/Output from previous day: 12/17 0701 - 12/18 0700 In: 1495.2 [I.V.:398.6; IV Piggyback:1096.6] Out: -  Intake/Output this shift: No intake/output data recorded.  Labs/Studies   Recent Labs Recent Labs    03/22/22 1110 03/23/22 0749  WBC 12.1* 11.3*  HGB 12.0 9.9*  HCT 36.8 30.9*  PLT 188 162   BMET Recent Labs    03/22/22 1110 03/23/22 0749  NA 140 139  K 3.7 3.0*  CL 111 110  CO2 23 23  GLUCOSE 116* 94  BUN 14 17  CREATININE 0.74 0.73  CALCIUM 8.9 8.1*   LFT Recent Labs    03/22/22 1110 03/23/22 0749  PROT 7.1 5.4*  ALBUMIN 4.0 2.8*  AST 17 15  ALT 14 13  ALKPHOS 47 33*  BILITOT 0.5 0.7    PT/INR Recent Labs    03/22/22 1532  LABPROT 15.8*  INR 1.3*     Radiology/Studies DG Chest Port 1 View  Result Date: 03/22/2022 CLINICAL DATA:  Nausea EXAM: PORTABLE CHEST 1 VIEW COMPARISON:  CT 03/22/2022, chest x-ray report 01/15/2014 FINDINGS: Calcified bilateral breast implants. Elevation of the right diaphragm. Linear scarring or atelectasis in the left lower lung. Normal cardiac size. Aortic atherosclerosis. No pneumothorax. IMPRESSION: No active disease. Linear scarring or atelectasis in the left lower lung. Electronically Signed   By: Donavan Foil M.D.   On: 03/22/2022 15:12   CT Abdomen Pelvis W Contrast  Result Date: 03/22/2022 CLINICAL DATA:  Lower abdominal pain, nausea, and diarrhea beginning yesterday. EXAM: CT ABDOMEN AND PELVIS WITH CONTRAST TECHNIQUE: Multidetector CT imaging of the abdomen and pelvis was performed using the standard protocol following bolus administration of intravenous contrast. RADIATION DOSE REDUCTION: This exam was performed according to the departmental dose-optimization program which includes automated exposure control, adjustment of the mA and/or kV according to patient size and/or use of iterative reconstruction technique. CONTRAST:  170m OMNIPAQUE IOHEXOL 300 MG/ML  SOLN COMPARISON:  None Available. FINDINGS: Lower Chest: No acute findings. Hepatobiliary: No hepatic masses identified. Prior cholecystectomy noted. Mild biliary ductal dilatation noted, likely due to prior cholecystectomy. Pancreas: No mass or inflammatory changes. No evidence of pancreatic ductal dilatation. Spleen: Within normal limits in size and appearance. Adrenals/Urinary Tract: No suspicious masses identified. No evidence of ureteral calculi or hydronephrosis. Stomach/Bowel: Large amount colonic stool is seen. Mild colonic wall thickening pericolonic soft tissue stranding is seen involving the descending and sigmoid colon which is distended by stool, but shows no evidence of  diverticular disease. This is consistent with colitis, and differential diagnosis includes infectious etiologies and stercoral colitis. No evidence of abscess. Vascular/Lymphatic:  No pathologically enlarged lymph nodes. No acute vascular findings. Aortic atherosclerotic calcification incidentally noted. Reproductive:  No mass or other significant abnormality. Other:  None. Musculoskeletal:  No suspicious bone lesions identified. IMPRESSION: Mild colitis involving the descending and sigmoid colon, with large amount of stool noted. Differential diagnosis includes infectious etiologies and stercoral colitis. Aortic Atherosclerosis (ICD10-I70.0). Electronically Signed   By: Marlaine Hind M.D.   On: 03/22/2022 12:58     Assessment   DEISSY GUILBERT is a 66 y.o. year old female with past medical history of depression, hypercholesterolemia, HTN, esophagitis, PUD on EGD Oct 2023, multiple polyps in Oct 2023, presenting to the ED with abdominal pain, nausea, and diarrhea. CT findings of colitis involving descending and sigmoid colon.   Colitis: suspect stercoral colitis in light of stool burden. Acute onset of diarrhea was noted Saturday, but she has had none since admission. No stool studies completed yet as diarrhea has resolved. Agree with Cdiff and GI pathogen panel if further diarrhea. Will start Miralax due to large stool burden. Could have had spurious diarrhea.   History of large polyps, adenomas: colonoscopy 01/2022. Will need early interval surveillance in April 2024.  History of PUD: 10/23 EGD with non-bleeding gastric ulcers. Negative H.pylori on path. H.pylori breath test negative. Admits to [facility] powders intermittently. Recommend complete cessation.   Hypotension: documented vitals around 0700 with BP 81/64. No tachycardia. Prior to this was 104/57. Asymptomatic. Repeat BP by Nurse tech 97/50.   Normocytic anemia: Hgb 9.9 this morning, previously 12 yesterday. No overt GI bleeding. Follow serially.  Suspect hemodilution playing a role.   Plan / Recommendations    Stool studies if further diarrhea PPI daily Potassium replacement per hospitalist Empiric antibiotics Will start Miralax due to stool burden Outpatient colonoscopy around April 2024     03/23/2022, 11:23 AM  Annitta Needs, PhD, ANP-BC [facility]

## 2022-03-23 NOTE — Progress Notes (Addendum)
PROGRESS NOTE    ANYI FELS  AYT:[id number] DOB: 05-15-1955 DOA: 03/22/2022 PCP: Glenda Chroman, MD    Brief Narrative:   Joanna Sutton is a 66 y.o. female with past medical history significant for HTN, HLD, depression, GERD, diverticulosis, colonic polyps, peptic ulcer disease, who presented to [facility] ED on 12/17 with complaints of abdominal pain, nausea/diarrhea over the last 2 days.  Patient reports ate at a [employer] 2 days prior to admission that could be the cause of her symptoms.  Abdominal pain is reported as generalized but greatest in the left lower quadrant.  Denies any blood in her stool and that her stools are "clear liquid".  No recent medication changes or antibiotic use.  In the ED, temperature 101.1 F, HR 76, RR 14, BP 128/62, SpO2 97% on room air.  WBC 12.1, hemoglobin 12.0, platelets 188.  Sodium 140, potassium 3.7, chloride 111, CO2 23, glucose 116, BUN 14, creatinine 0.74.  Lipase 42.  AST 17, ALT 14, total bilirubin 0.5.  Lactic acid 0.5.  Urinalysis with negative leukocytes, negative nitrite, no bacteria.  Chest x-ray with no active lung disease.  CT abdomen/pelvis with mild colitis involving the descending and sigmoid colon with large amount of stool noted. Patient was started on ceftriaxone and given IV fluids.  TRH consulted for admission for further evaluation and management of gastroenteritis/colitis.  Assessment & Plan:   Gastroenteritis/colitis Presenting to ED with 2-day history of nausea, vomiting, diarrhea.  Patient reports likely secondary to food ingestion 2 days prior to admission.  Patient with a fever of 101.1, WBC count of 12.1 with CT abdomen/pelvis notable for mild colitis involving the descending and sigmoid colon.  Lipase within normal limits.  LFTs within normal limits.  Urinalysis unrevealing. -- WBC 12.1>11.3 -- C. difficile PCR: Pending -- GI PCR: Pending -- Ceftriaxone 1 g IV q24h -- Metronidazole 500 mg IV q12h -- LR at  49m/h -- Advance to clear liquid diet to soft diet today -- Zofran as needed nausea/vomiting -- Continue enteric precautions -- CBC daily  Hypokalemia Potassium 3.0, likely secondary to GI loss with diarrhea. -- Will replete today -- Repeat BMP with magnesium in the a.m.  Hypertension BP 104/57 this morning.  Currently not on antihypertensives outpatient. -- Monitor BP closely  Hyperlipidemia -- Atorvastatin 40 mg p.o. daily  Peptic ulcer disease Dysphagia Follows with GI outpatient, recent colonoscopy/EGD with findings of several polyps removed, diverticulosis and dilated nonobstructive Schatzki ring with nonbleeding gastric ulcers with a clean base. -- Continue Protonix 40 mg p.o. daily  Depression -- Lexapro 10 mg p.o. daily   DVT prophylaxis: enoxaparin (LOVENOX) injection 40 mg Start: 03/22/22 1700    Code Status: Full Code Family Communication: No family present at bedside this morning  Disposition Plan:  Level of care: Med-Surg Status is: Observation The patient remains OBS appropriate and will d/c before 2 midnights.    Consultants:  None  Procedures:  None  Antimicrobials:  Ceftriaxone 12/17>>   Subjective: Patient seen and examined at bedside, resting comfortably.  Lying in bed.  Continues with left lower quadrant abdominal pain and diarrhea.  Tmax 101.5 past 24 hours.  Patient would like something more than just clear liquids to eat.  No other specific questions or concerns at this time.  Denies headache, no dizziness, no chest pain, no palpitations, no shortness of breath, no chills/night sweats, no focal weakness, no fatigue, no paresthesias.  No acute events overnight per nursing staff.  Objective:  Vitals:   03/23/22 0504 03/23/22 0707 03/23/22 0907 03/23/22 0909  BP: (!) 104/57 (!) 81/64 (!) 86/52 (!) 82/55  Pulse: 82 72 64   Resp: '20 18 16   '$ Temp: (!) 101.5 F (38.6 C) 98.6 F (37 C) 98.2 F (36.8 C)   TempSrc: Oral Oral Oral   SpO2: 98%  98% 99%   Weight:      Height:        Intake/Output Summary (Last 24 hours) at 03/23/2022 1157 Last data filed at 03/23/2022 0600 Gross per 24 hour  Intake 1495.22 ml  Output --  Net 1495.22 ml   Filed Weights   03/22/22 1125 03/23/22 0105  Weight: 61.2 kg 62.8 kg    Examination:  Physical Exam: GEN: NAD, alert and oriented x 3, ill/elderly in appearance HEENT: NCAT, PERRL, EOMI, sclera clear, MMM PULM: CTAB w/o wheezes/crackles, normal respiratory effort, on 2 L nasal cannula with SpO2 100% at rest CV: RRR w/o M/G/R GI: abd soft, mild LLQ TTP, nondistended, NABS, no R/G/M MSK: no peripheral edema, moves all extremities independently NEURO: CN II-XII intact, no focal deficits, sensation to light touch intact PSYCH: normal mood/affect Integumentary: dry/intact, no rashes or wounds    Data Reviewed: I have personally reviewed following labs and imaging studies  CBC: Recent Labs  Lab 03/22/22 1110 03/23/22 0749  WBC 12.1* 11.3*  NEUTROABS 10.9*  --   HGB 12.0 9.9*  HCT 36.8 30.9*  MCV 96.1 98.4  PLT 188 295   Basic Metabolic Panel: Recent Labs  Lab 03/22/22 1110 03/23/22 0749  NA 140 139  K 3.7 3.0*  CL 111 110  CO2 23 23  GLUCOSE 116* 94  BUN 14 17  CREATININE 0.74 0.73  CALCIUM 8.9 8.1*   GFR: Estimated Creatinine Clearance: 57.2 mL/min (by C-G formula based on SCr of 0.73 mg/dL). Liver Function Tests: Recent Labs  Lab 03/22/22 1110 03/23/22 0749  AST 17 15  ALT 14 13  ALKPHOS 47 33*  BILITOT 0.5 0.7  PROT 7.1 5.4*  ALBUMIN 4.0 2.8*   Recent Labs  Lab 03/22/22 1110  LIPASE 42   No results for input(s): "AMMONIA" in the last 168 hours. Coagulation Profile: Recent Labs  Lab 03/22/22 1532  INR 1.3*   Cardiac Enzymes: No results for input(s): "CKTOTAL", "CKMB", "CKMBINDEX", "TROPONINI" in the last 168 hours. BNP (last 3 results) No results for input(s): "PROBNP" in the last 8760 hours. HbA1C: No results for input(s): "HGBA1C" in  the last 72 hours. CBG: No results for input(s): "GLUCAP" in the last 168 hours. Lipid Profile: No results for input(s): "CHOL", "HDL", "LDLCALC", "TRIG", "CHOLHDL", "LDLDIRECT" in the last 72 hours. Thyroid Function Tests: No results for input(s): "TSH", "T4TOTAL", "FREET4", "T3FREE", "THYROIDAB" in the last 72 hours. Anemia Panel: No results for input(s): "VITAMINB12", "FOLATE", "FERRITIN", "TIBC", "IRON", "RETICCTPCT" in the last 72 hours. Sepsis Labs: Recent Labs  Lab 03/22/22 1532 03/22/22 1714  LATICACIDVEN 0.5 0.6    Recent Results (from the past 240 hour(s))  Blood Culture (routine x 2)     Status: None (Preliminary result)   Collection Time: 03/22/22  3:32 PM   Specimen: Left Antecubital; Blood  Result Value Ref Range Status   Specimen Description   Final    LEFT ANTECUBITAL BOTTLES DRAWN AEROBIC AND ANAEROBIC   Special Requests Blood Culture adequate volume  Final   Culture   Final    NO GROWTH < 12 HOURS Performed at [facility], [address] [place],  [place] [address]    Report Status PENDING  Incomplete  Blood Culture (routine x 2)     Status: None (Preliminary result)   Collection Time: 03/22/22  3:32 PM   Specimen: BLOOD RIGHT FOREARM  Result Value Ref Range Status   Specimen Description   Final    BLOOD RIGHT FOREARM BOTTLES DRAWN AEROBIC AND ANAEROBIC   Special Requests Blood Culture adequate volume  Final   Culture   Final    NO GROWTH < 12 HOURS Performed at [facility], [address] [place], [place] [address]    Report Status PENDING  Incomplete  Resp panel by RT-PCR (RSV, Flu A&B, Covid) Anterior Nasal Swab     Status: None   Collection Time: 03/22/22  4:05 PM   Specimen: Anterior Nasal Swab  Result Value Ref Range Status   SARS Coronavirus 2 by RT PCR NEGATIVE NEGATIVE Final    Comment: (NOTE) SARS-CoV-2 target nucleic acids are NOT DETECTED.  The SARS-CoV-2 RNA is generally detectable in upper respiratory specimens during the acute phase  of infection. The lowest concentration of SARS-CoV-2 viral copies this assay can detect is 138 copies/mL. A negative result does not preclude SARS-Cov-2 infection and should not be used as the sole basis for treatment or other patient management decisions. A negative result may occur with  improper specimen collection/handling, submission of specimen other than nasopharyngeal swab, presence of viral mutation(s) within the areas targeted by this assay, and inadequate number of viral copies(<138 copies/mL). A negative result must be combined with clinical observations, patient history, and epidemiological information. The expected result is Negative.  Fact Sheet for Patients:  EntrepreneurPulse.com.au  Fact Sheet for Healthcare Providers:  IncredibleEmployment.be  This test is no t yet approved or cleared by the [place] FDA and  has been authorized for detection and/or diagnosis of SARS-CoV-2 by FDA under an Emergency Use Authorization (EUA). This EUA will remain  in effect (meaning this test can be used) for the duration of the COVID-19 declaration under Section 564(b)(1) of the Act, 21 U.S.C.section 360bbb-3(b)(1), unless the authorization is terminated  or revoked sooner.       Influenza A by PCR NEGATIVE NEGATIVE Final   Influenza B by PCR NEGATIVE NEGATIVE Final    Comment: (NOTE) The Xpert Xpress SARS-CoV-2/FLU/RSV plus assay is intended as an aid in the diagnosis of influenza from Nasopharyngeal swab specimens and should not be used as a sole basis for treatment. Nasal washings and aspirates are unacceptable for Xpert Xpress SARS-CoV-2/FLU/RSV testing.  Fact Sheet for Patients: EntrepreneurPulse.com.au  Fact Sheet for Healthcare Providers: IncredibleEmployment.be  This test is not yet approved or cleared by the [place] FDA and has been authorized for detection and/or diagnosis of  SARS-CoV-2 by FDA under an Emergency Use Authorization (EUA). This EUA will remain in effect (meaning this test can be used) for the duration of the COVID-19 declaration under Section 564(b)(1) of the Act, 21 U.S.C. section 360bbb-3(b)(1), unless the authorization is terminated or revoked.     Resp Syncytial Virus by PCR NEGATIVE NEGATIVE Final    Comment: (NOTE) Fact Sheet for Patients: EntrepreneurPulse.com.au  Fact Sheet for Healthcare Providers: IncredibleEmployment.be  This test is not yet approved or cleared by the [place] FDA and has been authorized for detection and/or diagnosis of SARS-CoV-2 by FDA under an Emergency Use Authorization (EUA). This EUA will remain in effect (meaning this test can be used) for the duration of the COVID-19 declaration under Section 564(b)(1) of the Act,  21 U.S.C. section 360bbb-3(b)(1), unless the authorization is terminated or revoked.  Performed at [facility], [address] [place], [place] [address]          Radiology Studies: [facility] Chest [facility] 1 View  Result Date: 03/22/2022 CLINICAL DATA:  Nausea EXAM: PORTABLE CHEST 1 VIEW COMPARISON:  CT 03/22/2022, chest x-ray report 01/15/2014 FINDINGS: Calcified bilateral breast implants. Elevation of the right diaphragm. Linear scarring or atelectasis in the left lower lung. Normal cardiac size. Aortic atherosclerosis. No pneumothorax. IMPRESSION: No active disease. Linear scarring or atelectasis in the left lower lung. Electronically Signed   By: Donavan Foil M.D.   On: 03/22/2022 15:12   CT Abdomen Pelvis W Contrast  Result Date: 03/22/2022 CLINICAL DATA:  Lower abdominal pain, nausea, and diarrhea beginning yesterday. EXAM: CT ABDOMEN AND PELVIS WITH CONTRAST TECHNIQUE: Multidetector CT imaging of the abdomen and pelvis was performed using the standard protocol following bolus administration of intravenous contrast. RADIATION DOSE REDUCTION: This  exam was performed according to the departmental dose-optimization program which includes automated exposure control, adjustment of the mA and/or kV according to patient size and/or use of iterative reconstruction technique. CONTRAST:  157m OMNIPAQUE IOHEXOL 300 MG/ML  SOLN COMPARISON:  None Available. FINDINGS: Lower Chest: No acute findings. Hepatobiliary: No hepatic masses identified. Prior cholecystectomy noted. Mild biliary ductal dilatation noted, likely due to prior cholecystectomy. Pancreas: No mass or inflammatory changes. No evidence of pancreatic ductal dilatation. Spleen: Within normal limits in size and appearance. Adrenals/Urinary Tract: No suspicious masses identified. No evidence of ureteral calculi or hydronephrosis. Stomach/Bowel: Large amount colonic stool is seen. Mild colonic wall thickening pericolonic soft tissue stranding is seen involving the descending and sigmoid colon which is distended by stool, but shows no evidence of diverticular disease. This is consistent with colitis, and differential diagnosis includes infectious etiologies and stercoral colitis. No evidence of abscess. Vascular/Lymphatic: No pathologically enlarged lymph nodes. No acute vascular findings. Aortic atherosclerotic calcification incidentally noted. Reproductive:  No mass or other significant abnormality. Other:  None. Musculoskeletal:  No suspicious bone lesions identified. IMPRESSION: Mild colitis involving the descending and sigmoid colon, with large amount of stool noted. Differential diagnosis includes infectious etiologies and stercoral colitis. Aortic Atherosclerosis (ICD10-I70.0). Electronically Signed   By: JMarlaine HindM.D.   On: 03/22/2022 12:58        Scheduled Meds:  atorvastatin  40 mg Oral Daily   cyanocobalamin  2,000 mcg Oral Daily   enoxaparin (LOVENOX) injection  40 mg Subcutaneous Q24H   escitalopram  10 mg Oral Daily   loratadine  10 mg Oral Daily   pantoprazole  40 mg Oral Daily    Continuous Infusions:  cefTRIAXone (ROCEPHIN)  IV       LOS: 0 days    Time spent: 52 minutes spent on chart review, discussion with nursing staff, consultants, updating family and interview/physical exam; more than 50% of that time was spent in counseling and/or coordination of care.    Damein Gaunce J [place] DO [facility] Available via Epic secure chat 7am-7pm After these hours, please refer to coverage provider listed on [email or web address] 03/23/2022, 11:57 AM

## 2022-03-24 DIAGNOSIS — Z8719 Personal history of other diseases of the digestive system: Secondary | ICD-10-CM | POA: Diagnosis not present

## 2022-03-24 DIAGNOSIS — R188 Other ascites: Secondary | ICD-10-CM | POA: Diagnosis present

## 2022-03-24 DIAGNOSIS — R1032 Left lower quadrant pain: Secondary | ICD-10-CM | POA: Diagnosis not present

## 2022-03-24 DIAGNOSIS — R5381 Other malaise: Secondary | ICD-10-CM | POA: Diagnosis present

## 2022-03-24 DIAGNOSIS — K651 Peritoneal abscess: Secondary | ICD-10-CM | POA: Diagnosis present

## 2022-03-24 DIAGNOSIS — Z806 Family history of leukemia: Secondary | ICD-10-CM | POA: Diagnosis not present

## 2022-03-24 DIAGNOSIS — R197 Diarrhea, unspecified: Secondary | ICD-10-CM | POA: Diagnosis not present

## 2022-03-24 DIAGNOSIS — A419 Sepsis, unspecified organism: Secondary | ICD-10-CM | POA: Diagnosis not present

## 2022-03-24 DIAGNOSIS — K5720 Diverticulitis of large intestine with perforation and abscess without bleeding: Secondary | ICD-10-CM | POA: Diagnosis present

## 2022-03-24 DIAGNOSIS — E876 Hypokalemia: Secondary | ICD-10-CM | POA: Diagnosis present

## 2022-03-24 DIAGNOSIS — I959 Hypotension, unspecified: Secondary | ICD-10-CM | POA: Diagnosis present

## 2022-03-24 DIAGNOSIS — Z8711 Personal history of peptic ulcer disease: Secondary | ICD-10-CM | POA: Diagnosis not present

## 2022-03-24 DIAGNOSIS — I10 Essential (primary) hypertension: Secondary | ICD-10-CM | POA: Diagnosis present

## 2022-03-24 DIAGNOSIS — Z9049 Acquired absence of other specified parts of digestive tract: Secondary | ICD-10-CM | POA: Diagnosis not present

## 2022-03-24 DIAGNOSIS — R1084 Generalized abdominal pain: Secondary | ICD-10-CM | POA: Diagnosis not present

## 2022-03-24 DIAGNOSIS — Z79899 Other long term (current) drug therapy: Secondary | ICD-10-CM | POA: Diagnosis not present

## 2022-03-24 DIAGNOSIS — Z1152 Encounter for screening for COVID-19: Secondary | ICD-10-CM | POA: Diagnosis not present

## 2022-03-24 DIAGNOSIS — E7800 Pure hypercholesterolemia, unspecified: Secondary | ICD-10-CM | POA: Diagnosis present

## 2022-03-24 DIAGNOSIS — K5904 Chronic idiopathic constipation: Secondary | ICD-10-CM | POA: Diagnosis present

## 2022-03-24 DIAGNOSIS — K2100 Gastro-esophageal reflux disease with esophagitis, without bleeding: Secondary | ICD-10-CM | POA: Diagnosis present

## 2022-03-24 DIAGNOSIS — F32A Depression, unspecified: Secondary | ICD-10-CM | POA: Diagnosis present

## 2022-03-24 DIAGNOSIS — D649 Anemia, unspecified: Secondary | ICD-10-CM | POA: Diagnosis present

## 2022-03-24 DIAGNOSIS — K567 Ileus, unspecified: Secondary | ICD-10-CM | POA: Diagnosis not present

## 2022-03-24 DIAGNOSIS — K529 Noninfective gastroenteritis and colitis, unspecified: Secondary | ICD-10-CM | POA: Diagnosis present

## 2022-03-24 DIAGNOSIS — K56600 Partial intestinal obstruction, unspecified as to cause: Secondary | ICD-10-CM | POA: Diagnosis not present

## 2022-03-24 DIAGNOSIS — Z880 Allergy status to penicillin: Secondary | ICD-10-CM | POA: Diagnosis not present

## 2022-03-24 LAB — CBC
HCT: 30.7 % — ABNORMAL LOW (ref 36.0–46.0)
Hemoglobin: 9.6 g/dL — ABNORMAL LOW (ref 12.0–15.0)
MCH: 31.4 pg (ref 26.0–34.0)
MCHC: 31.3 g/dL (ref 30.0–36.0)
MCV: 100.3 fL — ABNORMAL HIGH (ref 80.0–100.0)
Platelets: 165 10*3/uL (ref 150–400)
RBC: 3.06 MIL/uL — ABNORMAL LOW (ref 3.87–5.11)
RDW: 13.7 % (ref 11.5–15.5)
WBC: 11 10*3/uL — ABNORMAL HIGH (ref 4.0–10.5)
nRBC: 0 % (ref 0.0–0.2)

## 2022-03-24 LAB — GASTROINTESTINAL PANEL BY PCR, STOOL (REPLACES STOOL CULTURE)

## 2022-03-24 LAB — URINE CULTURE: Culture: 40000 — AB

## 2022-03-24 LAB — BASIC METABOLIC PANEL
Anion gap: 6 (ref 5–15)
BUN: 14 mg/dL (ref 8–23)
CO2: 22 mmol/L (ref 22–32)
Calcium: 8.3 mg/dL — ABNORMAL LOW (ref 8.9–10.3)
Chloride: 112 mmol/L — ABNORMAL HIGH (ref 98–111)
Creatinine, Ser: 0.77 mg/dL (ref 0.44–1.00)
GFR, Estimated: >60 mL/min (ref >60)
Glucose, Bld: 102 mg/dL — ABNORMAL HIGH (ref 70–99)
Potassium: 4.1 mmol/L (ref 3.5–5.1)
Sodium: 140 mmol/L (ref 135–145)

## 2022-03-24 LAB — MAGNESIUM: Magnesium: 1.9 mg/dL (ref 1.7–2.4)

## 2022-03-24 IMAGING — MG DIGITAL SCREENING BREAST BILAT IMPLANT W/ TOMO W/ CAD
8 of 12 series · 8 of 28 positions shown · non-contrast
Comparison: Previous exam(s).

CLINICAL DATA: Screening.

EXAM:
DIGITAL SCREENING BILATERAL MAMMOGRAM WITH IMPLANTS, CAD AND
TOMOSYNTHESIS
TECHNIQUE: Bilateral screening digital craniocaudal and mediolateral oblique
mammograms were obtained. Bilateral screening digital breast
tomosynthesis was performed. The images were evaluated with
computer-aided detection. Standard and/or implant displaced views
were performed.

[R CC]
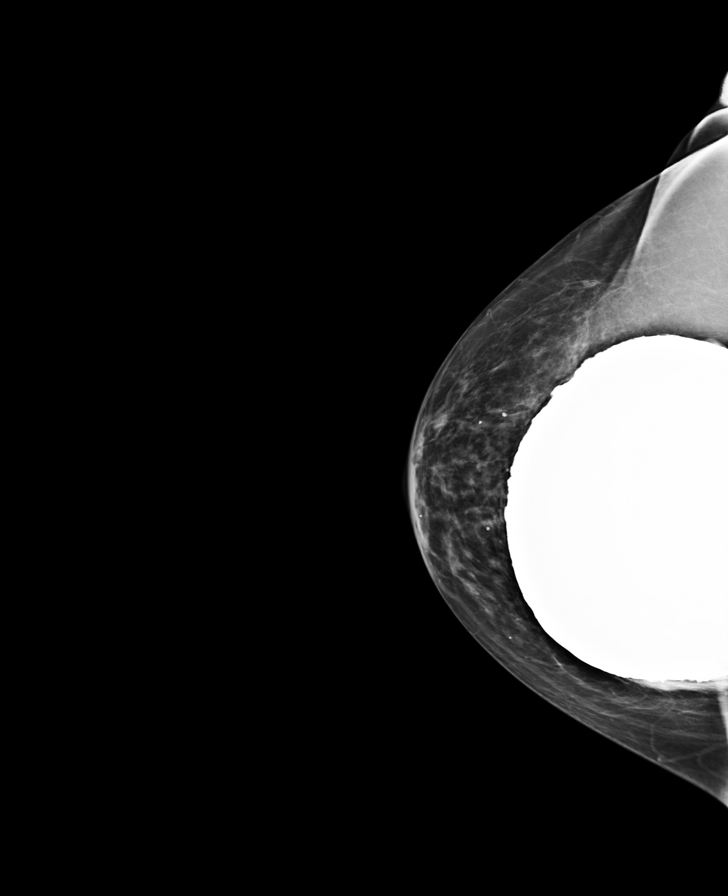

[L MLO]
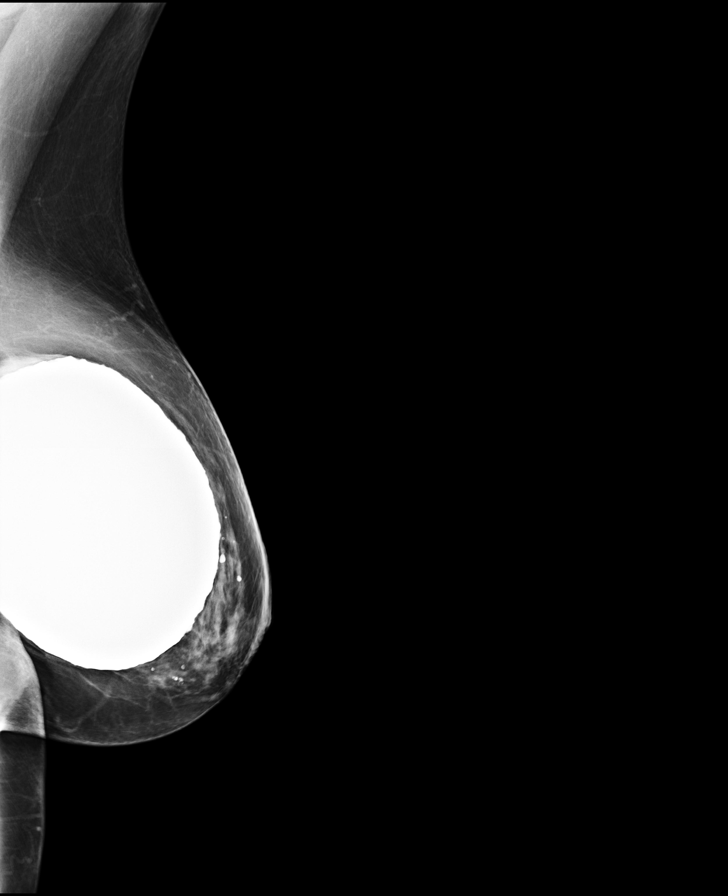

[R MLO]
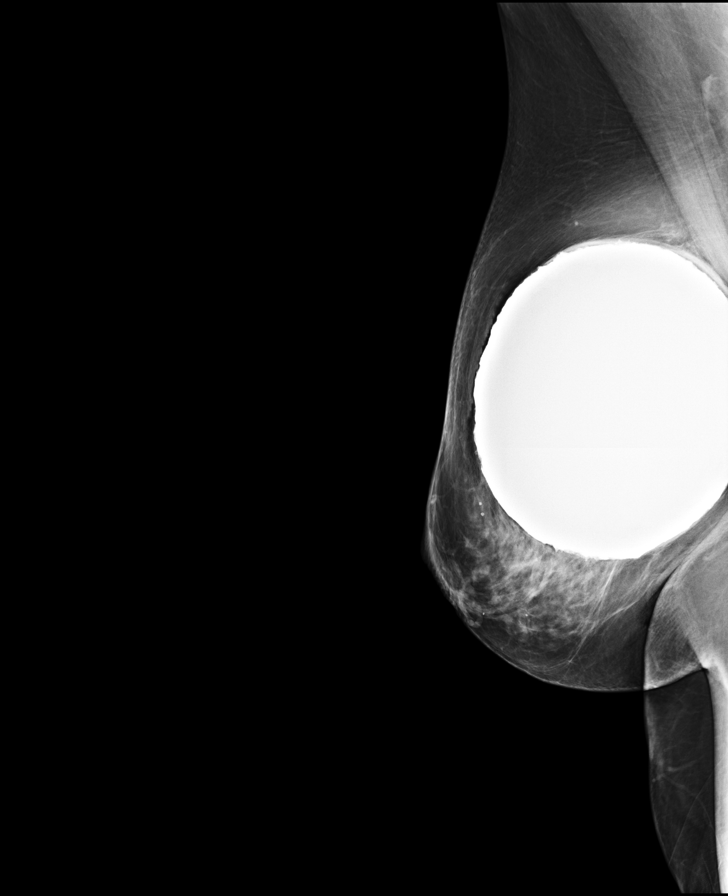

[L CC]
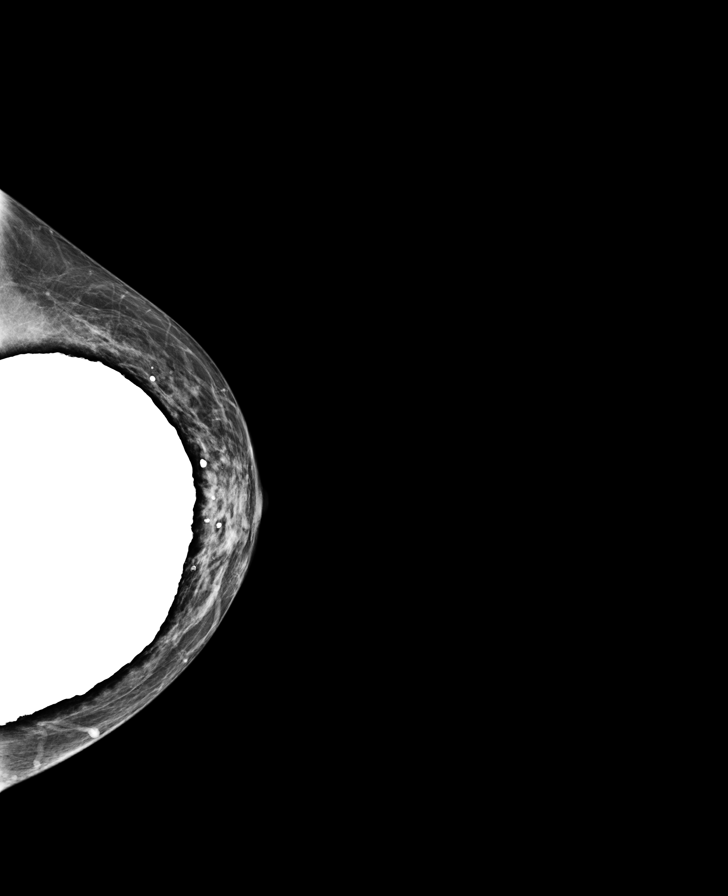

[L CC synth-2D]
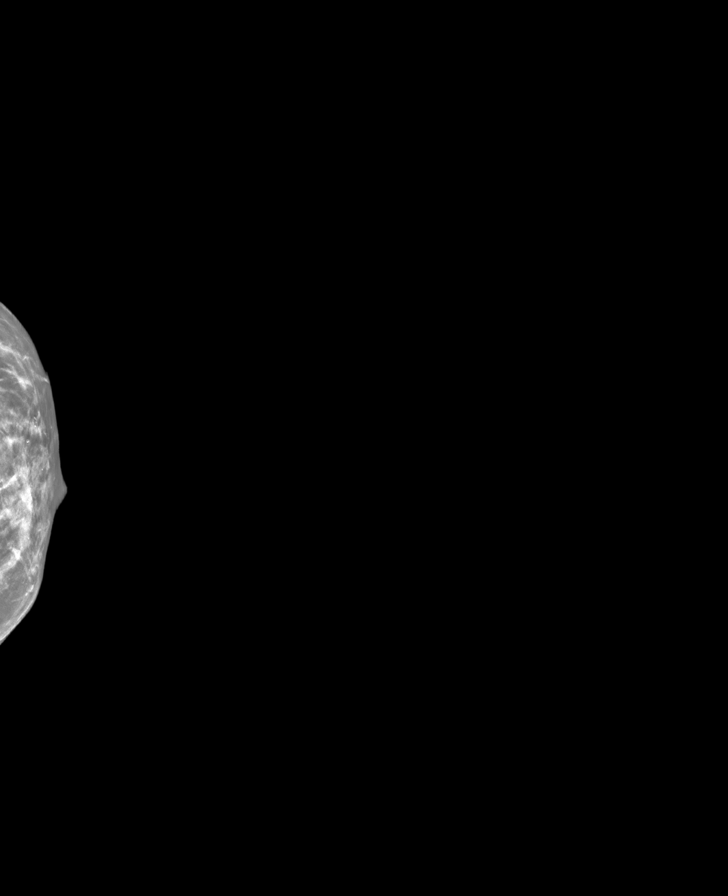

[R MLO synth-2D]
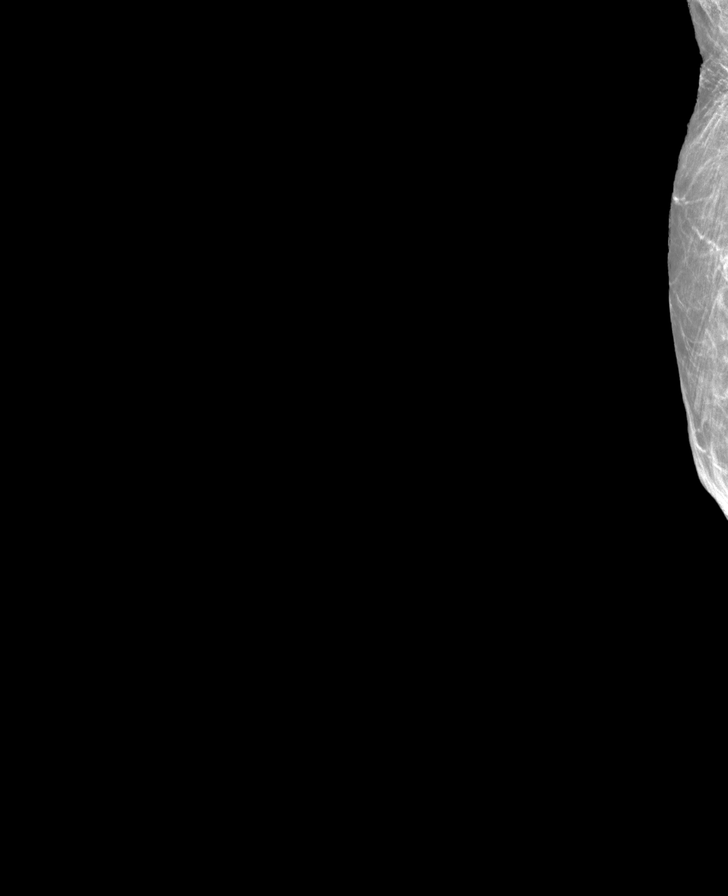

[L MLO synth-2D]
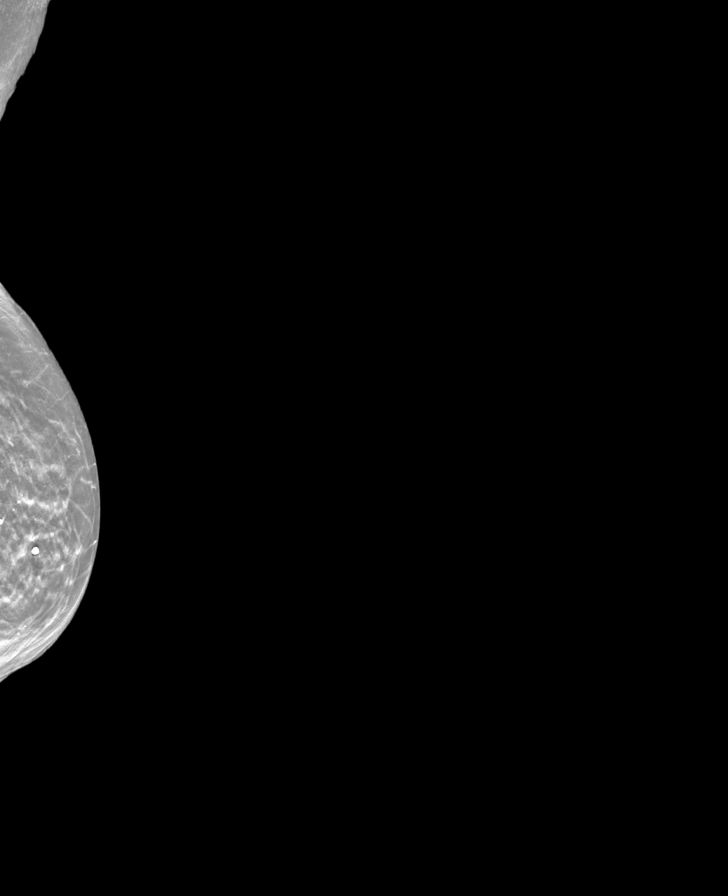

[R CC synth-2D]
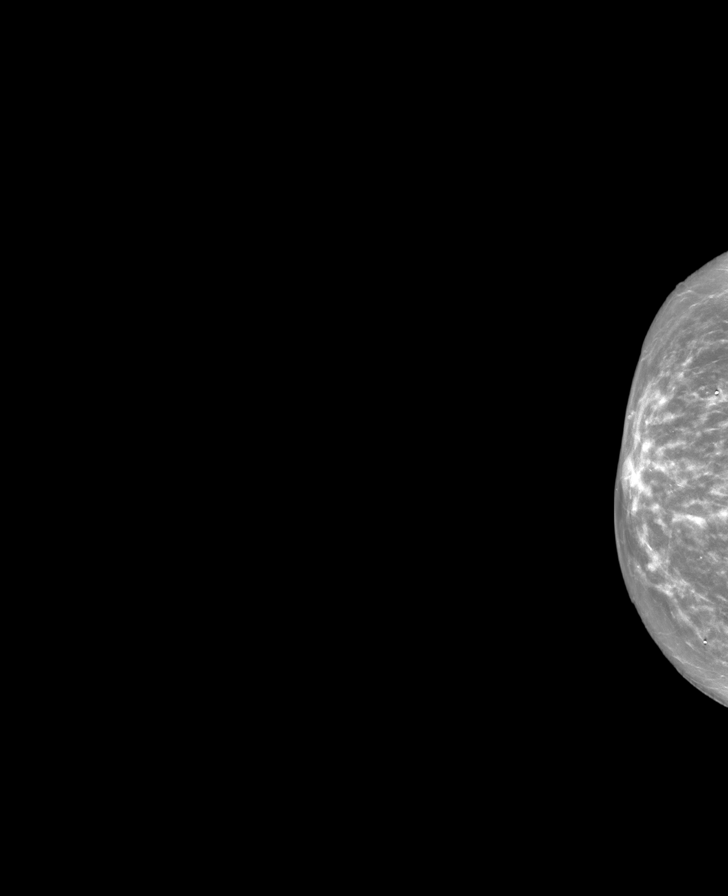

[8 of 28 positions shown; findings below may reference images not displayed]

ACR Breast Density Category c: The breast tissue is heterogeneously
dense, which may obscure small masses.
FINDINGS: The patient has retroglandular implants. There are no findings
suspicious for malignancy.
IMPRESSION: No mammographic evidence of malignancy. A result letter of this
screening mammogram will be mailed directly to the patient.

RECOMMENDATION:
Screening mammogram in one year. (Code:[id number])

BI-RADS CATEGORY  1:  Negative.

## 2022-03-24 MED ORDER — MORPHINE SULFATE (PF) 4 MG/ML IV SOLN
4.0000 mg | INTRAVENOUS | Status: DC | PRN
  Administered 2022-03-25: 4 mg via INTRAVENOUS
  Filled 2022-03-24: qty 1

## 2022-03-24 NOTE — Plan of Care (Signed)
  Problem: Acute Rehab PT Goals(only PT should resolve) Goal: Pt Will Go Supine/Side To Sit Outcome: Progressing Flowsheets (Taken 03/24/2022 0853) Pt will go Supine/Side to Sit: with supervision Goal: Patient Will Transfer Sit To/From Stand Outcome: Progressing Flowsheets (Taken 03/24/2022 0853) Patient will transfer sit to/from stand: with supervision Goal: Pt Will Transfer Bed To Chair/Chair To Bed Outcome: Progressing Flowsheets (Taken 03/24/2022 0853) Pt will Transfer Bed to Chair/Chair to Bed: with supervision Goal: Pt Will Ambulate Outcome: Progressing Flowsheets (Taken 03/24/2022 0853) Pt will Ambulate:  100 feet  with min guard assist

## 2022-03-24 NOTE — Progress Notes (Signed)
[facility] PROGRESS NOTE    Progress Note  Joanna Sutton  UMP:[id number] DOB: 1955/12/29 DOA: 03/22/2022 PCP: Glenda Chroman, MD     Brief Narrative:   Joanna Sutton is an 66 y.o. female past medical history significant for essential hypertension diverticulosis peptic ulcer disease comes into any pain for abdominal pain nausea vomiting and diarrhea that started 2 days prior to admission CT scan of the abdomen pelvis showed mild colitis of the descending colon with large amount of stool noted was started empirically on Rocephin and Flagyl.  Assessment/Plan:   Gastroenteritis/ Colitis Tmax of 100.5, with mild leukocytosis. She was started empirically on admission Rocephin and Flagyl. C. difficile PCR negative SARS-CoV-2 and influenza were negative GI PCR panel pending She was started on normal saline at a lower clear liquid diet. Zofran for nausea. She relates she continues to be nauseated her abdominal pain has not improved. Monitor and replete electrolytes as needed. Out of bed to chair consult physical therapy.  Hypokalemia: It was repleted orally now resolved.  Essential hypertension: Acute renal antihypertensive medications continue to monitor vitals.  Hyperlipidemia: Continue statins.  History of peptic ulcer disease/dysphagia: Continue Protonix p.o. twice daily.  Depression: Continue Lexapro.   DVT prophylaxis: lovenox Family Communication:none Status is: Observation The patient remains OBS appropriate and will d/c before 2 midnights.    Code Status:     Code Status Orders  (From admission, onward)           Start     Ordered   03/22/22 1539  Full code  Continuous       Question:  By:  Answer:  Consent: discussion documented in EHR   03/22/22 1539           Code Status History     This patient has a current code status but no historical code status.         IV Access:   Peripheral IV   Procedures and diagnostic studies:    DG Chest Port 1 View  Result Date: 03/22/2022 CLINICAL DATA:  Nausea EXAM: PORTABLE CHEST 1 VIEW COMPARISON:  CT 03/22/2022, chest x-ray report 01/15/2014 FINDINGS: Calcified bilateral breast implants. Elevation of the right diaphragm. Linear scarring or atelectasis in the left lower lung. Normal cardiac size. Aortic atherosclerosis. No pneumothorax. IMPRESSION: No active disease. Linear scarring or atelectasis in the left lower lung. Electronically Signed   By: Donavan Foil M.D.   On: 03/22/2022 15:12   CT Abdomen Pelvis W Contrast  Result Date: 03/22/2022 CLINICAL DATA:  Lower abdominal pain, nausea, and diarrhea beginning yesterday. EXAM: CT ABDOMEN AND PELVIS WITH CONTRAST TECHNIQUE: Multidetector CT imaging of the abdomen and pelvis was performed using the standard protocol following bolus administration of intravenous contrast. RADIATION DOSE REDUCTION: This exam was performed according to the departmental dose-optimization program which includes automated exposure control, adjustment of the mA and/or kV according to patient size and/or use of iterative reconstruction technique. CONTRAST:  130m OMNIPAQUE IOHEXOL 300 MG/ML  SOLN COMPARISON:  None Available. FINDINGS: Lower Chest: No acute findings. Hepatobiliary: No hepatic masses identified. Prior cholecystectomy noted. Mild biliary ductal dilatation noted, likely due to prior cholecystectomy. Pancreas: No mass or inflammatory changes. No evidence of pancreatic ductal dilatation. Spleen: Within normal limits in size and appearance. Adrenals/Urinary Tract: No suspicious masses identified. No evidence of ureteral calculi or hydronephrosis. Stomach/Bowel: Large amount colonic stool is seen. Mild colonic wall thickening pericolonic soft tissue stranding is seen involving the descending and sigmoid colon  which is distended by stool, but shows no evidence of diverticular disease. This is consistent with colitis, and differential diagnosis includes  infectious etiologies and stercoral colitis. No evidence of abscess. Vascular/Lymphatic: No pathologically enlarged lymph nodes. No acute vascular findings. Aortic atherosclerotic calcification incidentally noted. Reproductive:  No mass or other significant abnormality. Other:  None. Musculoskeletal:  No suspicious bone lesions identified. IMPRESSION: Mild colitis involving the descending and sigmoid colon, with large amount of stool noted. Differential diagnosis includes infectious etiologies and stercoral colitis. Aortic Atherosclerosis (ICD10-I70.0). Electronically Signed   By: Marlaine Hind M.D.   On: 03/22/2022 12:58     Medical Consultants:   None.   Subjective:    Joanna Sutton continues to have abdominal pain nausea vomiting had 1 bowel movement  Objective:    Vitals:   03/23/22 1157 03/23/22 1600 03/23/22 2123 03/24/22 0521  BP: (!) 97/50 (!) 125/54 (!) 111/58 130/63  Pulse: 71 88 90 89  Resp:  '20 18 18  '$ Temp:  100 F (37.8 C) (!) 100.5 F (38.1 C) 98.7 F (37.1 C)  TempSrc:  Oral Oral   SpO2: 100% 98% 93% 92%  Weight:      Height:       SpO2: 92 % O2 Flow Rate (L/min): 1 L/min   Intake/Output Summary (Last 24 hours) at 03/24/2022 0659 Last data filed at 03/24/2022 0500 Gross per 24 hour  Intake 200 ml  Output --  Net 200 ml   Filed Weights   03/22/22 1125 03/23/22 0105  Weight: 61.2 kg 62.8 kg    Exam: General exam: In no acute distress. Respiratory system: Good air movement and clear to auscultation. Cardiovascular system: S1 & S2 heard, RRR. No JVD. Gastrointestinal system: Abdomen is nondistended, soft and nontender.  Extremities: No pedal edema. Skin: No rashes, lesions or ulcers Psychiatry: Judgement and insight appear normal. Mood & affect appropriate.    Data Reviewed:    Labs: Basic Metabolic Panel: Recent Labs  Lab 03/22/22 1110 03/23/22 0749 03/24/22 0322  NA 140 139 140  K 3.7 3.0* 4.1  CL 111 110 112*  CO2 '23 23 22  '$ GLUCOSE  116* 94 102*  BUN '14 17 14  '$ CREATININE 0.74 0.73 0.77  CALCIUM 8.9 8.1* 8.3*  MG  --   --  1.9   GFR Estimated Creatinine Clearance: 57.2 mL/min (by C-G formula based on SCr of 0.77 mg/dL). Liver Function Tests: Recent Labs  Lab 03/22/22 1110 03/23/22 0749  AST 17 15  ALT 14 13  ALKPHOS 47 33*  BILITOT 0.5 0.7  PROT 7.1 5.4*  ALBUMIN 4.0 2.8*   Recent Labs  Lab 03/22/22 1110  LIPASE 42   No results for input(s): "AMMONIA" in the last 168 hours. Coagulation profile Recent Labs  Lab 03/22/22 1532  INR 1.3*   COVID-19 Labs  No results for input(s): "DDIMER", "FERRITIN", "LDH", "CRP" in the last 72 hours.  Lab Results  Component Value Date   SARSCOV2NAA NEGATIVE 03/22/2022   SARSCOV2NAA Detected (A) 12/21/2018    CBC: Recent Labs  Lab 03/22/22 1110 03/23/22 0749 03/24/22 0322  WBC 12.1* 11.3* 11.0*  NEUTROABS 10.9*  --   --   HGB 12.0 9.9* 9.6*  HCT 36.8 30.9* 30.7*  MCV 96.1 98.4 100.3*  PLT 188 162 165   Cardiac Enzymes: No results for input(s): "CKTOTAL", "CKMB", "CKMBINDEX", "TROPONINI" in the last 168 hours. BNP (last 3 results) No results for input(s): "PROBNP" in the last 8760 hours. CBG: No  results for input(s): "GLUCAP" in the last 168 hours. D-Dimer: No results for input(s): "DDIMER" in the last 72 hours. Hgb A1c: No results for input(s): "HGBA1C" in the last 72 hours. Lipid Profile: No results for input(s): "CHOL", "HDL", "LDLCALC", "TRIG", "CHOLHDL", "LDLDIRECT" in the last 72 hours. Thyroid function studies: No results for input(s): "TSH", "T4TOTAL", "T3FREE", "THYROIDAB" in the last 72 hours.  Invalid input(s): "FREET3" Anemia work up: No results for input(s): "VITAMINB12", "FOLATE", "FERRITIN", "TIBC", "IRON", "RETICCTPCT" in the last 72 hours. Sepsis Labs: Recent Labs  Lab 03/22/22 1110 03/22/22 1532 03/22/22 1714 03/23/22 0749 03/24/22 0322  WBC 12.1*  --   --  11.3* 11.0*  LATICACIDVEN  --  0.5 0.6  --   --     Microbiology Recent Results (from the past 240 hour(s))  Urine Culture     Status: None (Preliminary result)   Collection Time: 03/22/22 11:52 AM   Specimen: In/Out Cath Urine  Result Value Ref Range Status   Specimen Description   Final    IN/OUT CATH URINE Performed at [facility], [address] [place], [place] [address]    Special Requests   Final    NONE Performed at [facility], [address], [place], [place] [address]    Culture   Final    TOO YOUNG TO READ Performed at [facility], [place] [address], [place], [place] [address]    Report Status PENDING  Incomplete  Blood Culture (routine x 2)     Status: None (Preliminary result)   Collection Time: 03/22/22  3:32 PM   Specimen: Left Antecubital; Blood  Result Value Ref Range Status   Specimen Description   Final    LEFT ANTECUBITAL BOTTLES DRAWN AEROBIC AND ANAEROBIC   Special Requests Blood Culture adequate volume  Final   Culture   Final    NO GROWTH < 12 HOURS Performed at [facility], [address] [place], [place] [address]    Report Status PENDING  Incomplete  Blood Culture (routine x 2)     Status: None (Preliminary result)   Collection Time: 03/22/22  3:32 PM   Specimen: BLOOD RIGHT FOREARM  Result Value Ref Range Status   Specimen Description   Final    BLOOD RIGHT FOREARM BOTTLES DRAWN AEROBIC AND ANAEROBIC   Special Requests Blood Culture adequate volume  Final   Culture   Final    NO GROWTH < 12 HOURS Performed at [facility], [address] [place], [place] [address]    Report Status PENDING  Incomplete  Resp panel by RT-PCR (RSV, Flu A&B, Covid) Anterior Nasal Swab     Status: None   Collection Time: 03/22/22  4:05 PM   Specimen: Anterior Nasal Swab  Result Value Ref Range Status   SARS Coronavirus 2 by RT PCR NEGATIVE NEGATIVE Final    Comment: (NOTE) SARS-CoV-2 target nucleic acids are NOT DETECTED.  The SARS-CoV-2 RNA is generally detectable in upper respiratory specimens  during the acute phase of infection. The lowest concentration of SARS-CoV-2 viral copies this assay can detect is 138 copies/mL. A negative result does not preclude SARS-Cov-2 infection and should not be used as the sole basis for treatment or other patient management decisions. A negative result may occur with  improper specimen collection/handling, submission of specimen other than nasopharyngeal swab, presence of viral mutation(s) within the areas targeted by this assay, and inadequate number of viral copies(<138 copies/mL). A negative result must be combined with clinical observations,  patient history, and epidemiological information. The expected result is Negative.  Fact Sheet for Patients:  EntrepreneurPulse.com.au  Fact Sheet for Healthcare Providers:  IncredibleEmployment.be  This test is no t yet approved or cleared by the [place] FDA and  has been authorized for detection and/or diagnosis of SARS-CoV-2 by FDA under an Emergency Use Authorization (EUA). This EUA will remain  in effect (meaning this test can be used) for the duration of the COVID-19 declaration under Section 564(b)(1) of the Act, 21 U.S.C.section 360bbb-3(b)(1), unless the authorization is terminated  or revoked sooner.       Influenza A by PCR NEGATIVE NEGATIVE Final   Influenza B by PCR NEGATIVE NEGATIVE Final    Comment: (NOTE) The Xpert Xpress SARS-CoV-2/FLU/RSV plus assay is intended as an aid in the diagnosis of influenza from Nasopharyngeal swab specimens and should not be used as a sole basis for treatment. Nasal washings and aspirates are unacceptable for Xpert Xpress SARS-CoV-2/FLU/RSV testing.  Fact Sheet for Patients: EntrepreneurPulse.com.au  Fact Sheet for Healthcare Providers: IncredibleEmployment.be  This test is not yet approved or cleared by the [place] FDA and has been authorized for detection  and/or diagnosis of SARS-CoV-2 by FDA under an Emergency Use Authorization (EUA). This EUA will remain in effect (meaning this test can be used) for the duration of the COVID-19 declaration under Section 564(b)(1) of the Act, 21 U.S.C. section 360bbb-3(b)(1), unless the authorization is terminated or revoked.     Resp Syncytial Virus by PCR NEGATIVE NEGATIVE Final    Comment: (NOTE) Fact Sheet for Patients: EntrepreneurPulse.com.au  Fact Sheet for Healthcare Providers: IncredibleEmployment.be  This test is not yet approved or cleared by the [place] FDA and has been authorized for detection and/or diagnosis of SARS-CoV-2 by FDA under an Emergency Use Authorization (EUA). This EUA will remain in effect (meaning this test can be used) for the duration of the COVID-19 declaration under Section 564(b)(1) of the Act, 21 U.S.C. section 360bbb-3(b)(1), unless the authorization is terminated or revoked.  Performed at [facility], [address] [place], [place] [address]   C Difficile Quick Screen w PCR reflex     Status: None   Collection Time: 03/22/22  5:45 PM   Specimen: STOOL  Result Value Ref Range Status   C Diff antigen NEGATIVE NEGATIVE Final   C Diff toxin NEGATIVE NEGATIVE Final   C Diff interpretation No C. difficile detected.  Final    Comment: Performed at [facility], [address] [place], [place] [address]     Medications:    atorvastatin  40 mg Oral Daily   cyanocobalamin  2,000 mcg Oral Daily   enoxaparin (LOVENOX) injection  40 mg Subcutaneous Q24H   escitalopram  10 mg Oral Daily   loratadine  10 mg Oral Daily   pantoprazole  40 mg Oral Daily   polyethylene glycol  17 g Oral Daily   Continuous Infusions:  cefTRIAXone (ROCEPHIN)  IV 1 g (03/23/22 1224)   lactated ringers 75 mL/hr at 03/23/22 1705   metronidazole 500 mg (03/24/22 0300)      LOS: 0 days   Charlynne Cousins  [facility]  03/24/2022, 6:59 AM

## 2022-03-24 NOTE — Progress Notes (Signed)
Gastroenterology Progress Note   Referring Provider: No ref. provider found Primary Care Physician:  Glenda Chroman, MD Primary Gastroenterologist:  Dr. Jenetta Downer  Patient ID: Joanna Sutton; [id number]; 07-11-1955   Subjective:    Not feeling better. Abdominal pain the same. Nausea improved. Low grade fever over night again. Stools bristol 6-7, has had two in the last 24 hours. States she has intermittent constipation, may skip a day without a BM but had been going regularly before onset of symptoms. Continues intermittent ASA powders.   Objective:   Vital signs in last 24 hours: Temp:  [98.7 F (37.1 C)-100.5 F (38.1 C)] 98.7 F (37.1 C) (12/19 0521) Pulse Rate:  [71-90] 89 (12/19 0521) Resp:  [18-20] 18 (12/19 0521) BP: (97-130)/(50-63) 130/63 (12/19 0521) SpO2:  [92 %-100 %] 92 % (12/19 0521) Last BM Date : 03/23/22 General:   Alert,  Well-developed, well-nourished, pleasant and cooperative in NAD Head:  Normocephalic and atraumatic. Eyes:  Sclera clear, no icterus.  Chest: CTA bilaterally without rales, rhonchi, crackles.    Heart:  Regular rate and rhythm; no murmurs, clicks, rubs,  or gallops. Abdomen:  Soft, nondistended. Hypoactive bowel sounds. Diffuse tenderness. No guarding, and without rebound.   Extremities:  Without clubbing, deformity or edema. Neurologic:  Alert and  oriented x4;  grossly normal neurologically. Skin:  Intact without significant lesions or rashes. Psych:  Alert and cooperative. Normal mood and affect.  Intake/Output from previous day: 12/18 0701 - 12/19 0700 In: 200 [P.O.:200] Out: -  Intake/Output this shift: Total I/O In: 240 [P.O.:240] Out: -   Lab Results: CBC Recent Labs    03/22/22 1110 03/23/22 0749 03/24/22 0322  WBC 12.1* 11.3* 11.0*  HGB 12.0 9.9* 9.6*  HCT 36.8 30.9* 30.7*  MCV 96.1 98.4 100.3*  PLT 188 162 165   BMET Recent Labs    03/22/22 1110 03/23/22 0749 03/24/22 0322  NA 140 139 140  K 3.7 3.0* 4.1   CL 111 110 112*  CO2 '23 23 22  '$ GLUCOSE 116* 94 102*  BUN '14 17 14  '$ CREATININE 0.74 0.73 0.77  CALCIUM 8.9 8.1* 8.3*   LFTs Recent Labs    03/22/22 1110 03/23/22 0749  BILITOT 0.5 0.7  ALKPHOS 47 33*  AST 17 15  ALT 14 13  PROT 7.1 5.4*  ALBUMIN 4.0 2.8*   Recent Labs    03/22/22 1110  LIPASE 42   PT/INR Recent Labs    03/22/22 1532  LABPROT 15.8*  INR 1.3*         Imaging Studies: DG Chest Port 1 View  Result Date: 03/22/2022 CLINICAL DATA:  Nausea EXAM: PORTABLE CHEST 1 VIEW COMPARISON:  CT 03/22/2022, chest x-ray report 01/15/2014 FINDINGS: Calcified bilateral breast implants. Elevation of the right diaphragm. Linear scarring or atelectasis in the left lower lung. Normal cardiac size. Aortic atherosclerosis. No pneumothorax. IMPRESSION: No active disease. Linear scarring or atelectasis in the left lower lung. Electronically Signed   By: Donavan Foil M.D.   On: 03/22/2022 15:12   CT Abdomen Pelvis W Contrast  Result Date: 03/22/2022 CLINICAL DATA:  Lower abdominal pain, nausea, and diarrhea beginning yesterday. EXAM: CT ABDOMEN AND PELVIS WITH CONTRAST TECHNIQUE: Multidetector CT imaging of the abdomen and pelvis was performed using the standard protocol following bolus administration of intravenous contrast. RADIATION DOSE REDUCTION: This exam was performed according to the departmental dose-optimization program which includes automated exposure control, adjustment of the mA and/or kV according to patient size and/or  use of iterative reconstruction technique. CONTRAST:  124m OMNIPAQUE IOHEXOL 300 MG/ML  SOLN COMPARISON:  None Available. FINDINGS: Lower Chest: No acute findings. Hepatobiliary: No hepatic masses identified. Prior cholecystectomy noted. Mild biliary ductal dilatation noted, likely due to prior cholecystectomy. Pancreas: No mass or inflammatory changes. No evidence of pancreatic ductal dilatation. Spleen: Within normal limits in size and appearance.  Adrenals/Urinary Tract: No suspicious masses identified. No evidence of ureteral calculi or hydronephrosis. Stomach/Bowel: Large amount colonic stool is seen. Mild colonic wall thickening pericolonic soft tissue stranding is seen involving the descending and sigmoid colon which is distended by stool, but shows no evidence of diverticular disease. This is consistent with colitis, and differential diagnosis includes infectious etiologies and stercoral colitis. No evidence of abscess. Vascular/Lymphatic: No pathologically enlarged lymph nodes. No acute vascular findings. Aortic atherosclerotic calcification incidentally noted. Reproductive:  No mass or other significant abnormality. Other:  None. Musculoskeletal:  No suspicious bone lesions identified. IMPRESSION: Mild colitis involving the descending and sigmoid colon, with large amount of stool noted. Differential diagnosis includes infectious etiologies and stercoral colitis. Aortic Atherosclerosis (ICD10-I70.0). Electronically Signed   By: JMarlaine HindM.D.   On: 03/22/2022 12:58  [2 weeks]  Assessment:   Patient is a 66year old female with past medical history of depression, hypercholesterolemia, hypertension, esophagitis, peptic ulcer disease on EGD October 2023, multiple colonic polyps in October 2023, presenting to the ED with abdominal pain, nausea, diarrhea.  CT findings of colitis involving the descending and sigmoid colon.  Colitis: Acute onset of diarrhea noted Saturday, improved since admission.  GI pathogen panel negative, GI profile negative. Has had intermittent fever which would support infectious process. However given large stool burden on CT she was started on MiraLAX.  Clinical concern for spurious diarrhea and possible stercoral colitis in light of stool burden.  History of large polyps, adenomas at time of colonoscopy in October, will be having early interval surveillance in April 2024.  History of peptic ulcer disease: Noted on EGD in  October, nonbleeding gastric ulcers.  H. pylori breath test negative, negative H. pylori on path.  [facility], recommended complete cessation.  Normocytic anemia: Hemoglobin 12 on admission, 9.9 yesterday.  9.6 today.  No overt GI bleeding this admission.  Suspect hemodilutional playing a role.    Plan:   Agree with antibiotic coverage.  Continue supportive measures.  If abdominal pain does not improve, may need to consider repeat imaging.    LOS: 0 days   LLaureen Ochs LBernarda CaffeyRKosciusko Community HospitalGastro[facility]ology [facility] [phone]/19/202311:31 AM

## 2022-03-24 NOTE — Evaluation (Signed)
Physical Therapy Evaluation Patient Details Name: Joanna Sutton MRN: [id number] DOB: 1955-10-23 Today's Date: 03/24/2022  History of Present Illness  Joanna Sutton is a 66 y.o. female with medical history significant of HTN, HLD, depression, GERD, comes to the hospital with complaints of abdominal pain, nausea, diarrhea for the past 2 days.  She is not sure, but feels that it may have been related to eating out the day prior to admission.  She went to a [place] where she had Shrimp fajita, and tells me she has gotten sick from that place before but not always.  She has been followed by gastroenterology for rectal bleeding and dysphagia, recently in the past October she had a colonoscopy with several polyps removed and diverticulosis, and and upper endoscopy with a dilated nonobstructive Schatzki ring as well as nonbleeding gastric ulcers with a clean ulcer base.  Currently reports abdominal pain, throughout, with nausea and poor p.o. intake.  She denies any fever or chills.  She denies any blood in the stools and tells me that her stools are just pure liquid.  She denies any vomiting.  No recent medication changes or antibiotics    Clinical Impression  Patient lying in bed on therapist arrival.  Reports high pain levels 8/10 but agreeable to therapist assessment.  Patient performs supine to sit with minimal assistance and sit to stand with minimal assistance.  She is able to sit on EOB with good sitting balance with therapist CGA for safety.  Once standing, she is able to stand with CGA for safety but reports increasing pain levels with standing, marching so we did not attempt to walk.  Patient able to sit back down and get back into bed with therapist CGA to SBA.  Patient will benefit from continued skilled therapy services during the remainder of her hospital stay and at the next recommended venue of care to address deficits and promote return to optimal function.            Recommendations for follow up therapy are one component of a multi-disciplinary discharge planning process, led by the attending physician.  Recommendations may be updated based on patient status, additional functional criteria and insurance authorization.  Follow Up Recommendations Home health PT      Assistance Recommended at Discharge Intermittent Supervision/Assistance  Patient can return home with the following  A little help with walking and/or transfers;Help with stairs or ramp for entrance;A little help with bathing/dressing/bathroom    Equipment Recommendations None recommended by PT  Recommendations for Other Services       Functional Status Assessment Patient has had a recent decline in their functional status and demonstrates the ability to make significant improvements in function in a reasonable and predictable amount of time.     Precautions / Restrictions Precautions Precautions: None      Mobility  Bed Mobility Overal bed mobility: Needs Assistance Bed Mobility: Supine to Sit     Supine to sit: Min assist     General bed mobility comments: min A to come fully up to sitting from supine; HOB slightly elevated Patient Response: Anxious  Transfers Overall transfer level: Needs assistance Equipment used: None Transfers: Sit to/from Stand Sit to Stand: Min assist           General transfer comment: sit to stand with min A; needs CGA once standing for balance    Ambulation/Gait  Stairs            Wheelchair Mobility    Modified Rankin (Stroke Patients Only)       Balance Overall balance assessment: Needs assistance Sitting-balance support: Bilateral upper extremity supported, Feet supported Sitting balance-Leahy Scale: Good Sitting balance - Comments: good sitting balance on edge of bed with therapist CGA for safety   Standing balance support: During functional activity, Single extremity supported Standing  balance-Leahy Scale: Good Standing balance comment: good standing balance with PT CGA for safety                             Pertinent Vitals/Pain Pain Assessment Pain Assessment: 0-10 Pain Score: 8  Pain Location: stomach Pain Intervention(s): Limited activity within patient's tolerance, Monitored during session    Home Living Family/patient expects to be discharged to:: Private residence Living Arrangements: Alone Available Help at Discharge: Family Type of Home: House Home Access: Stairs to enter Entrance Stairs-Rails: None Technical brewer of Steps: 2   Home Layout: One level Home Equipment: Cane - single point      Prior Function Prior Level of Function : Independent/Modified Independent                     Hand Dominance   Dominant Hand: Right    Extremity/Trunk Assessment   Upper Extremity Assessment Upper Extremity Assessment: Generalized weakness    Lower Extremity Assessment Lower Extremity Assessment: Generalized weakness    Cervical / Trunk Assessment Cervical / Trunk Assessment: Normal  Communication   Communication: No difficulties  Cognition Arousal/Alertness: Awake/alert Behavior During Therapy: WFL for tasks assessed/performed Overall Cognitive Status: Within Functional Limits for tasks assessed                                          General Comments      Exercises     Assessment/Plan    PT Assessment Patient needs continued PT services  PT Problem List Decreased strength;Decreased balance;Decreased activity tolerance;Pain       PT Treatment Interventions Functional mobility training;Balance training;Patient/family education;Therapeutic activities;Neuromuscular re-education;Therapeutic exercise;Stair training    PT Goals (Current goals can be found in the Care Plan section)  Acute Rehab PT Goals Patient Stated Goal: return home PT Goal Formulation: With patient Time For Goal  Achievement: 04/07/22 Potential to Achieve Goals: Good    Frequency Min 2X/week     Co-evaluation               AM-PAC PT "6 Clicks" Mobility  Outcome Measure Help needed turning from your back to your side while in a flat bed without using bedrails?: A Little Help needed moving from lying on your back to sitting on the side of a flat bed without using bedrails?: A Little Help needed moving to and from a bed to a chair (including a wheelchair)?: A Little Help needed standing up from a chair using your arms (e.g., wheelchair or bedside chair)?: A Little Help needed to walk in hospital room?: A Lot Help needed climbing 3-5 steps with a railing? : A Lot 6 Click Score: 16    End of Session   Activity Tolerance: Patient limited by pain Patient left: in bed;with call bell/phone within reach Nurse Communication: Mobility status PT Visit Diagnosis: Other abnormalities of gait and mobility (R26.89);Muscle weakness (generalized) (M62.81);Pain Pain -  part of body:  (stomach)    Time: 6834-1962 PT Time Calculation (min) (ACUTE ONLY): 20 min   Charges:   PT Evaluation $PT Eval Low Complexity: 1 Low          8:52 AM, 03/24/22 Makaleigh Reinard Small Kjirsten Bloodgood MPT [facility] [place] [phone]

## 2022-03-24 NOTE — TOC Initial Note (Signed)
Transition of Care [facility]) - Initial/Assessment Note    Patient Details  Name: Joanna Sutton MRN: [id number] Date of Birth: 1955-11-24  [facility] CM/SW Contact:    Boneta Lucks, RN Phone Number: 03/24/2022, 11:40 AM  Clinical Narrative:   Patient admitted with colitis. Patient lives home alone. Her brother at the bedside states, he and his wife assist as needed. PT/OT is recommending Home health. Patient declined. Does not want any home services at this time.                 Expected Discharge Plan: [place] Barriers to Discharge: Continued Medical Work up   Patient Goals and CMS Choice Patient states their goals for this hospitalization and ongoing recovery are:: to go home CMS Medicare.gov Compare Post Acute Care list provided to:: Patient Choice offered to / list presented to : Patient  Expected Discharge Plan and Services Expected Discharge Plan: [place]       Prior Living Arrangements/Services   Lives with:: Self Patient language and need for interpreter reviewed:: Yes        Need for Family Participation in Patient Care: Yes (Comment) Care giver support system in place?: Yes (comment)   Criminal Activity/Legal Involvement Pertinent to Current Situation/Hospitalization: No - Comment as needed  Activities of Daily Living Home Assistive Devices/Equipment: None ADL Screening (condition at time of admission) Patient's cognitive ability adequate to safely complete daily activities?: Yes Is the patient deaf or have difficulty hearing?: No Does the patient have difficulty seeing, even when wearing glasses/contacts?: No Does the patient have difficulty concentrating, remembering, or making decisions?: No Patient able to express need for assistance with ADLs?: Yes Does the patient have difficulty dressing or bathing?: No Independently performs ADLs?: Yes (appropriate for developmental age) Does the patient have  difficulty walking or climbing stairs?: No Weakness of Legs: None Weakness of Arms/Hands: None  Permission Sought/Granted        Permission granted to share info w Contact Information: Brother at the bedside  Emotional Assessment     Affect (typically observed): Flat Orientation: : Oriented to Self, Oriented to Place, Oriented to  Time, Oriented to Situation Alcohol / Substance Use: Not Applicable Psych Involvement: No (comment)  Admission diagnosis:  Colitis [K52.9] Sepsis, due to unspecified organism, unspecified whether acute organ dysfunction present [facility]) [A41.9] Patient Active Problem List   Diagnosis Date Noted   Abdominal pain, left lower quadrant 03/23/2022   Chronic idiopathic constipation 03/23/2022   Diarrhea 03/23/2022   Colitis 03/22/2022   Acute gastric ulcer without hemorrhage or perforation 02/16/2022   Gastroesophageal reflux disease without esophagitis 02/16/2022   Excessive use of nonsteroidal anti-inflammatory drug (NSAID) 11/18/2021   Positive colorectal cancer screening using Cologuard test 11/18/2021   Essential hypertension, benign 11/29/2012   High cholesterol 11/29/2012   Dysphagia 11/29/2012   PCP:  Glenda Chroman, MD Pharmacy:   [facility] [address] [place] - [address] [place] [address] Phone: [phone] Fax: [phone]    Readmission Risk Interventions    03/24/2022   11:40 AM  Readmission Risk Prevention Plan  Post Dischage Appt Not Complete  Medication Screening Complete  Transportation Screening Not Complete

## 2022-03-24 NOTE — Progress Notes (Signed)
Joanna Sutton, M.D. Gastroenterology & Hepatology   Interval History:  Patient reports having 2 watery Bms, abdominal pain is similar to admission. No nausea or vomiting. Tolerating liquids diet, would like to have more solid food.  Inpatient Medications:  Current Facility-Administered Medications:    acetaminophen (TYLENOL) tablet 500 mg, 500 mg, Oral, Q6H PRN, Cruzita Lederer, Costin M, MD, 500 mg at 03/23/22 0535   atorvastatin (LIPITOR) tablet 40 mg, 40 mg, Oral, Daily, Cruzita Lederer, Costin M, MD, 40 mg at 03/24/22 0848   cefTRIAXone (ROCEPHIN) 1 g in sodium chloride 0.9 % 100 mL IVPB, 1 g, Intravenous, Q24H, Gherghe, Costin M, MD, Last Rate: 200 mL/hr at 03/24/22 1216, 1 g at 03/24/22 1216   cyanocobalamin (VITAMIN B12) tablet 2,000 mcg, 2,000 mcg, Oral, Daily, Cruzita Lederer, Costin M, MD, 2,000 mcg at 03/24/22 0848   enoxaparin (LOVENOX) injection 40 mg, 40 mg, Subcutaneous, Q24H, Gherghe, Costin M, MD, 40 mg at 03/24/22 1751   escitalopram (LEXAPRO) tablet 10 mg, 10 mg, Oral, Daily, Cruzita Lederer, Costin M, MD, 10 mg at 03/24/22 0849   lactated ringers infusion, , Intravenous, Continuous, [place] Donnamarie Poag, DO, Last Rate: 75 mL/hr at 03/23/22 1705, New Bag at 03/23/22 1705   loratadine (CLARITIN) tablet 10 mg, 10 mg, Oral, Daily, Cruzita Lederer, Costin M, MD, 10 mg at 03/24/22 0849   metroNIDAZOLE (FLAGYL) IVPB 500 mg, 500 mg, Intravenous, Q12H, [place] Eric J, DO, Last Rate: 100 mL/hr at 03/24/22 1751, 500 mg at 03/24/22 1751   morphine (PF) 4 MG/ML injection 4 mg, 4 mg, Intravenous, Q3H PRN, Charlynne Cousins, MD   ondansetron [facility]) tablet 4 mg, 4 mg, Oral, Q6H PRN, 4 mg at 03/22/22 1657 **OR** ondansetron (ZOFRAN) injection 4 mg, 4 mg, Intravenous, Q6H PRN, Cruzita Lederer, Costin M, MD   oxyCODONE (Oxy IR/ROXICODONE) immediate release tablet 5 mg, 5 mg, Oral, Q4H PRN, Cruzita Lederer, Costin M, MD, 5 mg at 03/24/22 1214   pantoprazole (PROTONIX) EC tablet 40 mg, 40 mg, Oral, Daily, Cruzita Lederer, Costin M, MD, 40 mg at 03/24/22  0849   polyethylene glycol (MIRALAX / GLYCOLAX) packet 17 g, 17 g, Oral, Daily, Annitta Needs, NP, 17 g at 03/23/22 1440   I/O    Intake/Output Summary (Last 24 hours) at 03/24/2022 2059 Last data filed at 03/24/2022 1800 Gross per 24 hour  Intake 2420 ml  Output --  Net 2420 ml     Physical Exam: Temp:  [96.9 F (36.1 C)-100.5 F (38.1 C)] 96.9 F (36.1 C) (12/19 2007) Pulse Rate:  [89-95] 95 (12/19 2007) Resp:  [16-20] 20 (12/19 2007) BP: (108-130)/(58-63) 108/61 (12/19 2007) SpO2:  [92 %-93 %] 93 % (12/19 2007)  Temp (24hrs), Avg:98.5 F (36.9 C), Min:96.9 F (36.1 C), Max:100.5 F (38.1 C) GENERAL: The patient is AO x3, in no acute distress. HEENT: Head is normocephalic and atraumatic. EOMI are intact. Mouth is well hydrated and without lesions. NECK: Supple. No masses LUNGS: Clear to auscultation. No presence of rhonchi/wheezing/rales. Adequate chest expansion HEART: RRR, normal s1 and s2. ABDOMEN: ender in upper abdominal and hypogastric area, no guarding, no peritoneal signs, and nondistended. BS +. No masses. EXTREMITIES: Without any cyanosis, clubbing, rash, lesions or edema. NEUROLOGIC: AOx3, no focal motor deficit. SKIN: no jaundice, no rashes  Laboratory Data: CBC:     Component Value Date/Time   WBC 11.0 (H) 03/24/2022 0322   RBC 3.06 (L) 03/24/2022 0322   HGB 9.6 (L) 03/24/2022 0322   HCT 30.7 (L) 03/24/2022 0322   PLT 165 03/24/2022 0322  MCV 100.3 (H) 03/24/2022 0322   MCH 31.4 03/24/2022 0322   MCHC 31.3 03/24/2022 0322   RDW 13.7 03/24/2022 0322   LYMPHSABS 0.7 03/22/2022 1110   MONOABS 0.5 03/22/2022 1110   EOSABS 0.0 03/22/2022 1110   BASOSABS 0.0 03/22/2022 1110   COAG:  Lab Results  Component Value Date   INR 1.3 (H) 03/22/2022    BMP:     Latest Ref Rng & Units 03/24/2022    3:22 AM 03/23/2022    7:49 AM 03/22/2022   11:10 AM  BMP  Glucose 70 - 99 mg/dL 102  94  116   BUN 8 - 23 mg/dL _0 Creatinine 0.44 - 1.00 mg/dL  0.77  0.73  0.74   Sodium 135 - 145 mmol/L 140  139  140   Potassium 3.5 - 5.1 mmol/L 4.1  3.0  3.7   Chloride 98 - 111 mmol/L 112  110  111   CO2 22 - 32 mmol/L _1 Calcium 8.9 - 10.3 mg/dL 8.3  8.1  8.9     HEPATIC:     Latest Ref Rng & Units 03/23/2022    7:49 AM 03/22/2022   11:10 AM  Hepatic Function  Total Protein 6.5 - 8.1 g/dL 5.4  7.1   Albumin 3.5 - 5.0 g/dL 2.8  4.0   AST 15 - 41 U/L 15  17   ALT 0 - 44 U/L 13  14   Alk Phosphatase 38 - 126 U/L 33  47   Total Bilirubin 0.3 - 1.2 mg/dL 0.7  0.5     CARDIAC: No results found for: "CKTOTAL", "CKMB", "CKMBINDEX", "TROPONINI"    Imaging: I personally reviewed and interpreted the available labs, imaging and endoscopic files.   Assessment/Plan: NAELA NODAL is a 66 y.o. year old female with past medical history of depression, hypercholesterolemia, HTN, esophagitis, PUD on EGD Oct 2023, multiple polyps in Oct 2023, presenting to the ED with abdominal pain, nausea, and diarrhea. CT findings of colitis involving descending and sigmoid colon.   Stool testing - GI path panel and C. Diff testing were negative. Currently on antibiotics with some improvement of diarrhea but still with abdominal pain. Given acute onset of symptoms with food intake this likely corresponds to an infectious etiology. Will slowly advance diet and continue ab for 7 days.  Will check O/P in stool.  If worsening or persistent pain will need to repeat abdominal imaging. Rule out vasculitis?  Joanna Peppers, MD Gastroenterology and Hepatology [facility] Gastroenterology

## 2022-03-25 ENCOUNTER — Inpatient Hospital Stay (HOSPITAL_COMMUNITY): Payer: PPO

## 2022-03-25 DIAGNOSIS — K5904 Chronic idiopathic constipation: Secondary | ICD-10-CM | POA: Diagnosis not present

## 2022-03-25 DIAGNOSIS — K529 Noninfective gastroenteritis and colitis, unspecified: Secondary | ICD-10-CM | POA: Diagnosis not present

## 2022-03-25 DIAGNOSIS — R197 Diarrhea, unspecified: Secondary | ICD-10-CM | POA: Diagnosis not present

## 2022-03-25 DIAGNOSIS — R1032 Left lower quadrant pain: Secondary | ICD-10-CM | POA: Diagnosis not present

## 2022-03-25 LAB — BASIC METABOLIC PANEL
Anion gap: 6 (ref 5–15)
BUN: 11 mg/dL (ref 8–23)
CO2: 22 mmol/L (ref 22–32)
Calcium: 8.3 mg/dL — ABNORMAL LOW (ref 8.9–10.3)
Chloride: 108 mmol/L (ref 98–111)
Creatinine, Ser: 0.58 mg/dL (ref 0.44–1.00)
GFR, Estimated: >60 mL/min (ref >60)
Glucose, Bld: 127 mg/dL — ABNORMAL HIGH (ref 70–99)
Potassium: 3.3 mmol/L — ABNORMAL LOW (ref 3.5–5.1)
Sodium: 136 mmol/L (ref 135–145)

## 2022-03-25 MED ORDER — MILK AND MOLASSES ENEMA
1.0000 | Freq: Once | RECTAL | Status: AC
  Administered 2022-03-25: 240 mL via RECTAL

## 2022-03-25 MED ORDER — POTASSIUM CHLORIDE CRYS ER 20 MEQ PO TBCR
40.0000 meq | EXTENDED_RELEASE_TABLET | Freq: Once | ORAL | Status: AC
  Administered 2022-03-25: 40 meq via ORAL
  Filled 2022-03-25: qty 2

## 2022-03-25 NOTE — Progress Notes (Signed)
Progress Note    ELNORE COSENS   MGQ:[id number]  DOB: 1956/03/30  DOA: 03/22/2022     1 PCP: Glenda Chroman, MD  Initial CC: Abdominal pain, nausea, vomiting, diarrhea  Hospital Course: Ms. Coop is a 66 yo female with PMH HTN, diverticulosis, PUD who presented with abdominal pain, nausea/vomiting/diarrhea.  She underwent CT abdomen/pelvis which showed mild colitis involving descending and sigmoid colon with large amount of stool noted.  Concern was for infectious etiology.  GI was consulted.  She also underwent stool testing with C. difficile and GI pathogen panel which were both negative.  She was continued on empiric antibiotics.  Interval History:  No events overnight.  Resting in bed comfortable when seen this morning.  Still having some ongoing abdominal pain and nausea but states improved with medications.  Assessment and Plan:  Gastroenteritis/ Colitis Tmax of 100.5, with mild leukocytosis She was started empirically on admission Rocephin and Flagyl. C. difficile PCR negative SARS-CoV-2 and influenza were negative GI PCR panel also negative -Slowly advancing diet and monitoring for tolerance - Continue nausea control -Remains on empiric Rocephin and Flagyl   Hypokalemia -Repleted   Essential hypertension: Acute renal antihypertensive medications continue to monitor vitals.   Hyperlipidemia: Continue statins.   History of peptic ulcer disease/dysphagia: Continue Protonix p.o. twice daily.   Depression: Continue Lexapro.   Old records reviewed in assessment of this patient  Antimicrobials: Rocephin 03/22/2022 >> current Flagyl 03/23/2022 >> current  DVT prophylaxis:  enoxaparin (LOVENOX) injection 40 mg Start: 03/22/22 1700   Code Status:   Code Status: Full Code  Mobility Assessment (last 72 hours)     Mobility Assessment     Row Name 03/24/22 1950 03/24/22 0848 03/23/22 2037 03/23/22 0105     Does patient have an order for bedrest or is patient  medically unstable No - Continue assessment -- No - Continue assessment No - Continue assessment    What is the highest level of mobility based on the progressive mobility assessment? Level 4 (Walks with assist in room) - Balance while marching in place and cannot step forward and back - Complete Level 4 (Walks with assist in room) - Balance while marching in place and cannot step forward and back - Complete Level 6 (Walks independently in room and hall) - Balance while walking in room without assist - Complete --             Barriers to discharge:  Disposition Plan: Home 1 to 2 days Status is: Inpatient  Objective: Blood pressure 118/69, pulse 86, temperature (!) 97.1 F (36.2 C), resp. rate 16, height '5\' 1"'$  (1.549 m), weight 62.8 kg, SpO2 96 %.  Examination:  Physical Exam Constitutional:      Appearance: She is well-developed.  HENT:     Head: Normocephalic and atraumatic.  Eyes:     Pupils: Pupils are equal, round, and reactive to light.  Cardiovascular:     Rate and Rhythm: Normal rate and regular rhythm.  Pulmonary:     Effort: Pulmonary effort is normal. No respiratory distress.     Breath sounds: Normal breath sounds. No wheezing.  Abdominal:     General: Bowel sounds are normal. There is no distension.     Palpations: Abdomen is soft.  Musculoskeletal:        General: Normal range of motion.     Cervical back: Normal range of motion and neck supple.  Skin:    General: Skin is warm and dry.  Neurological:     General: No focal deficit present.     Mental Status: She is alert.  Psychiatric:        Mood and Affect: Mood normal.      Consultants:  GI  Procedures:    Data Reviewed: Results for orders placed or performed during the hospital encounter of 03/22/22 (from the past 24 hour(s))  Basic metabolic panel     Status: Abnormal   Collection Time: 03/25/22  3:27 AM  Result Value Ref Range   Sodium 136 135 - 145 mmol/L   Potassium 3.3 (L) 3.5 - 5.1 mmol/L    Chloride 108 98 - 111 mmol/L   CO2 22 22 - 32 mmol/L   Glucose, Bld 127 (H) 70 - 99 mg/dL   BUN 11 8 - 23 mg/dL   Creatinine, Ser 0.58 0.44 - 1.00 mg/dL   Calcium 8.3 (L) 8.9 - 10.3 mg/dL   GFR, Estimated >60 >60 mL/min   Anion gap 6 5 - 15    I have Reviewed nursing notes, Vitals, and Lab results since pt's last encounter. Pertinent lab results : see above I have ordered labwork to follow up on.  I have reviewed the last note from staff over past 24 hours I have discussed pt's care plan and test results with nursing staff, CM/SW, and other staff as appropriate  Time spent: Greater than 50% of the 55 minute visit was spent in counseling/coordination of care for the patient as laid out in the A&P.   LOS: 1 day   Dwyane Dee, MD [facility] 03/25/2022, 1:46 PM

## 2022-03-25 NOTE — Hospital Course (Signed)
Joanna Sutton is a 66 yo female with PMH HTN, diverticulosis, PUD who presented with abdominal pain, nausea/vomiting/diarrhea.  She underwent CT abdomen/pelvis which showed mild colitis involving descending and sigmoid colon with large amount of stool noted.  Concern was for infectious etiology.  GI was consulted.  She also underwent stool testing with C. difficile and GI pathogen panel which were both negative.  She was continued on empiric antibiotics.

## 2022-03-25 NOTE — Progress Notes (Signed)
Subjective: Eating lunch during my assessment. States she continues to have lower abdominal pain, feels that it is improving some. Is tolerating foods okay but appetite is not great. She thinks she has had about 4-5 BMs this morning, though unsure of consistency. Required anti emetics this morning for nausea and 1 episode of vomiting.   Objective: Vital signs in last 24 hours: Temp:  [96.9 F (36.1 C)-98 F (36.7 C)] 97.1 F (36.2 C) (12/20 0425) Pulse Rate:  [86-95] 86 (12/20 0425) Resp:  [16-20] 16 (12/20 0425) BP: (108-118)/(60-69) 118/69 (12/20 0425) SpO2:  [93 %-96 %] 96 % (12/20 0425) Last BM Date : 03/24/22 General:   Alert and oriented, pleasant Head:  Normocephalic and atraumatic. Eyes:  No icterus, sclera clear. Conjuctiva pink.  Mouth:  Without lesions, mucosa pink and moist.  Heart:  S1, S2 present, no murmurs noted.  Lungs: Clear to auscultation bilaterally, without wheezing, rales, or rhonchi.  Abdomen:  Bowel sounds present, soft, non-tender, non-distended. No HSM or hernias noted. No rebound or guarding. No masses appreciated  Msk:  Symmetrical without gross deformities. Normal posture. Pulses:  Normal pulses noted. Extremities:  Without clubbing or edema. Neurologic:  Alert and  oriented x4;  grossly normal neurologically. Skin:  Warm and dry, intact without significant lesions.  Psych:  Alert and cooperative. Normal mood and affect.  Intake/Output from previous day: 12/19 0701 - 12/20 0700 In: 3364.6 [P.O.:1080; I.V.:1584.6; IV Piggyback:700] Out: -  Intake/Output this shift: Total I/O In: 240 [P.O.:240] Out: -   Lab Results: Recent Labs    03/22/22 1110 03/23/22 0749 03/24/22 0322  WBC 12.1* 11.3* 11.0*  HGB 12.0 9.9* 9.6*  HCT 36.8 30.9* 30.7*  PLT 188 162 165   BMET Recent Labs    03/23/22 0749 03/24/22 0322 03/25/22 0327  NA 139 140 136  K 3.0* 4.1 3.3*  CL 110 112* 108  CO2 '23 22 22  '$ GLUCOSE 94 102* 127*  BUN '17 14 11  '$ CREATININE  0.73 0.77 0.58  CALCIUM 8.1* 8.3* 8.3*   LFT Recent Labs    03/22/22 1110 03/23/22 0749  PROT 7.1 5.4*  ALBUMIN 4.0 2.8*  AST 17 15  ALT 14 13  ALKPHOS 47 33*  BILITOT 0.5 0.7   PT/INR Recent Labs    03/22/22 1532  LABPROT 15.8*  INR 1.3*    Assessment: Joanna Sutton is a 66 year old female with PMH of depression, hypercholesterolemia, HTN, esophagitis, PUD on EGD Oct 2023, multiple polyps in Oct 2023, presenting to the ED with abdominal pain, nausea, and diarrhea. CT findings of colitis involving descending and sigmoid colon.    Colitis:  acute onset diarrhea Saturday, improved since admission, GI path, c diff negative. Intermittent fever suggesting infectious etiology though Large stool burden on CT a few days ago and started on miralax. Potential for stercoral colitis. Will obtain abd xray today for further evaluation to ensure diarrhea is not secondary to stool burden. O/P in stool ordered yesterday has not been collected. Will slowly advance diet and continue abx coverage x7 days for now. Due for repeat colonoscopy in april 2024.   Hx of PUD: noted on EGD in October with nonbleeding gastric ulcer likely secondary to previous heavy goody powder use which she admittedly is still using intermittently. H pylori testing negative previously  Normocytic anemia: hgb 12 on admission. 9.6 yesterday. No overt GI bleeding. Suspect related to hemodilution.    Plan: Soft diet today Continue with abx coverage Continues supportive  measures Abd xray  Follow for O&P results   LOS: 1 day    03/25/2022, 8:53 AM  [name] L. Alver Sorrow, MSN, APRN, AGNP-C Adult-Gerontology Nurse Practitioner [facility]

## 2022-03-25 NOTE — Progress Notes (Signed)
KUB today suspicious for SBO, case discussed by Dr. Abbey Chatters with Dr. Arnoldo Morale of Gen surg who feels this is more related to constipation/stool burden vs SBO. Diarrhea likely secondary to overflow. Will proceed with milk of molasses enema today and repeat KUB in the morning. Nursing staff updated on plan of care.

## 2022-03-26 ENCOUNTER — Inpatient Hospital Stay (HOSPITAL_COMMUNITY): Payer: PPO

## 2022-03-26 DIAGNOSIS — K5904 Chronic idiopathic constipation: Secondary | ICD-10-CM | POA: Diagnosis not present

## 2022-03-26 DIAGNOSIS — K529 Noninfective gastroenteritis and colitis, unspecified: Secondary | ICD-10-CM | POA: Diagnosis not present

## 2022-03-26 DIAGNOSIS — R1084 Generalized abdominal pain: Secondary | ICD-10-CM | POA: Diagnosis not present

## 2022-03-26 LAB — BASIC METABOLIC PANEL
Anion gap: 8 (ref 5–15)
BUN: 7 mg/dL — ABNORMAL LOW (ref 8–23)
CO2: 23 mmol/L (ref 22–32)
Calcium: 8.4 mg/dL — ABNORMAL LOW (ref 8.9–10.3)
Chloride: 107 mmol/L (ref 98–111)
Creatinine, Ser: 0.64 mg/dL (ref 0.44–1.00)
GFR, Estimated: >60 mL/min (ref >60)
Glucose, Bld: 108 mg/dL — ABNORMAL HIGH (ref 70–99)
Potassium: 3.8 mmol/L (ref 3.5–5.1)
Sodium: 138 mmol/L (ref 135–145)

## 2022-03-26 LAB — CBC WITH DIFFERENTIAL/PLATELET
Abs Immature Granulocytes: 0.08 10*3/uL — ABNORMAL HIGH (ref 0.00–0.07)
Basophils Absolute: 0 10*3/uL (ref 0.0–0.1)
Basophils Relative: 0 %
Eosinophils Absolute: 0 10*3/uL (ref 0.0–0.5)
Eosinophils Relative: 0 %
HCT: 31.2 % — ABNORMAL LOW (ref 36.0–46.0)
Hemoglobin: 9.8 g/dL — ABNORMAL LOW (ref 12.0–15.0)
Immature Granulocytes: 1 %
Lymphocytes Relative: 6 %
Lymphs Abs: 0.7 10*3/uL (ref 0.7–4.0)
MCH: 30.8 pg (ref 26.0–34.0)
MCHC: 31.4 g/dL (ref 30.0–36.0)
MCV: 98.1 fL (ref 80.0–100.0)
Monocytes Absolute: 1.3 10*3/uL — ABNORMAL HIGH (ref 0.1–1.0)
Monocytes Relative: 11 %
Neutro Abs: 8.9 10*3/uL — ABNORMAL HIGH (ref 1.7–7.7)
Neutrophils Relative %: 82 %
Platelets: 237 10*3/uL (ref 150–400)
RBC: 3.18 MIL/uL — ABNORMAL LOW (ref 3.87–5.11)
RDW: 13.6 % (ref 11.5–15.5)
WBC: 11 10*3/uL — ABNORMAL HIGH (ref 4.0–10.5)
nRBC: 0.2 % (ref 0.0–0.2)

## 2022-03-26 LAB — MAGNESIUM: Magnesium: 2.1 mg/dL (ref 1.7–2.4)

## 2022-03-26 MED ORDER — ACETAMINOPHEN 325 MG PO TABS
650.0000 mg | ORAL_TABLET | ORAL | Status: DC | PRN
  Administered 2022-03-26 – 2022-04-04 (×10): 650 mg via ORAL
  Filled 2022-03-26 (×10): qty 2

## 2022-03-26 NOTE — Progress Notes (Signed)
Progress Note    Joanna Sutton   DGU:[id number]  DOB: 12-29-1955  DOA: 03/22/2022     2 PCP: Joanna Chroman, MD  Initial CC: Abdominal pain, nausea, vomiting, diarrhea  Hospital Course: Joanna Sutton is a 66 yo female with PMH HTN, diverticulosis, PUD who presented with abdominal pain, nausea/vomiting/diarrhea.  She underwent CT abdomen/pelvis which showed mild colitis involving descending and sigmoid colon with large amount of stool noted.  Concern was for infectious etiology.  GI was consulted.  She also underwent stool testing with C. difficile and GI pathogen panel which were both negative.  She was continued on empiric antibiotics.  Interval History:  No events overnight.  Still having some ongoing pain.  Picking at her food and not eating much.  Did not have much complaints of nausea nor vomiting though.  Assessment and Plan:  Gastroenteritis/ Colitis Tmax of 100.5, with mild leukocytosis She was started empirically on admission Rocephin and Flagyl. C. difficile PCR negative SARS-CoV-2 and influenza were negative GI PCR panel also negative -Slowly advancing diet and monitoring for tolerance - Continue nausea control -Remains on empiric Rocephin and Flagyl  Constipation/Ileus  - No obvious obstruction on imaging and no worsening of nausea/vomiting.  Still not much output from bowel movements despite enema yesterday and bowel regimen -MiraLAX increased today per GI - Oxycodone discontinued.  Use Tylenol as able   Hypokalemia -Repleted   Essential hypertension: Acute renal antihypertensive medications continue to monitor vitals.   Hyperlipidemia: Continue statins.   History of peptic ulcer disease/dysphagia: Continue Protonix p.o. twice daily.   Depression: Continue Lexapro.   Old records reviewed in assessment of this patient  Antimicrobials: Rocephin 03/22/2022 >> current Flagyl 03/23/2022 >> current  DVT prophylaxis:  enoxaparin (LOVENOX) injection 40 mg  Start: 03/22/22 1700   Code Status:   Code Status: Full Code  Mobility Assessment (last 72 hours)     Mobility Assessment     Row Name 03/26/22 0801 03/25/22 2000 03/24/22 1950 03/24/22 0848 03/23/22 2037   Does patient have an order for bedrest or is patient medically unstable No - Continue assessment No - Continue assessment No - Continue assessment -- No - Continue assessment   What is the highest level of mobility based on the progressive mobility assessment? Level 4 (Walks with assist in room) - Balance while marching in place and cannot step forward and back - Complete Level 4 (Walks with assist in room) - Balance while marching in place and cannot step forward and back - Complete Level 4 (Walks with assist in room) - Balance while marching in place and cannot step forward and back - Complete Level 4 (Walks with assist in room) - Balance while marching in place and cannot step forward and back - Complete Level 6 (Walks independently in room and hall) - Balance while walking in room without assist - Complete            Barriers to discharge:  Disposition Plan: Home 1 to 2 days Status is: Inpatient  Objective: Blood pressure 127/68, pulse 88, temperature 97.8 F (36.6 C), resp. rate 18, height '5\' 1"'$  (1.549 m), weight 62.8 kg, SpO2 92 %.  Examination:  Physical Exam Constitutional:      Appearance: She is well-developed.  HENT:     Head: Normocephalic and atraumatic.  Eyes:     Pupils: Pupils are equal, round, and reactive to light.  Cardiovascular:     Rate and Rhythm: Normal rate and regular rhythm.  Pulmonary:     Effort: Pulmonary effort is normal. No respiratory distress.     Breath sounds: Normal breath sounds. No wheezing.  Abdominal:     General: Bowel sounds are normal. There is no distension.     Palpations: Abdomen is soft.  Musculoskeletal:        General: Normal range of motion.     Cervical back: Normal range of motion and neck supple.  Skin:    General:  Skin is warm and dry.  Neurological:     General: No focal deficit present.     Mental Status: She is alert.  Psychiatric:        Mood and Affect: Mood normal.      Consultants:  GI  Procedures:    Data Reviewed: Results for orders placed or performed during the hospital encounter of 03/22/22 (from the past 24 hour(s))  Basic metabolic panel     Status: Abnormal   Collection Time: 03/26/22  3:22 AM  Result Value Ref Range   Sodium 138 135 - 145 mmol/L   Potassium 3.8 3.5 - 5.1 mmol/L   Chloride 107 98 - 111 mmol/L   CO2 23 22 - 32 mmol/L   Glucose, Bld 108 (H) 70 - 99 mg/dL   BUN 7 (L) 8 - 23 mg/dL   Creatinine, Ser 0.64 0.44 - 1.00 mg/dL   Calcium 8.4 (L) 8.9 - 10.3 mg/dL   GFR, Estimated >60 >60 mL/min   Anion gap 8 5 - 15  CBC with Differential/Platelet     Status: Abnormal   Collection Time: 03/26/22  3:22 AM  Result Value Ref Range   WBC 11.0 (H) 4.0 - 10.5 K/uL   RBC 3.18 (L) 3.87 - 5.11 MIL/uL   Hemoglobin 9.8 (L) 12.0 - 15.0 g/dL   HCT 31.2 (L) 36.0 - 46.0 %   MCV 98.1 80.0 - 100.0 fL   MCH 30.8 26.0 - 34.0 pg   MCHC 31.4 30.0 - 36.0 g/dL   RDW 13.6 11.5 - 15.5 %   Platelets 237 150 - 400 K/uL   nRBC 0.2 0.0 - 0.2 %   Neutrophils Relative % 82 %   Neutro Abs 8.9 (H) 1.7 - 7.7 K/uL   Lymphocytes Relative 6 %   Lymphs Abs 0.7 0.7 - 4.0 K/uL   Monocytes Relative 11 %   Monocytes Absolute 1.3 (H) 0.1 - 1.0 K/uL   Eosinophils Relative 0 %   Eosinophils Absolute 0.0 0.0 - 0.5 K/uL   Basophils Relative 0 %   Basophils Absolute 0.0 0.0 - 0.1 K/uL   Immature Granulocytes 1 %   Abs Immature Granulocytes 0.08 (H) 0.00 - 0.07 K/uL  Magnesium     Status: None   Collection Time: 03/26/22  3:22 AM  Result Value Ref Range   Magnesium 2.1 1.7 - 2.4 mg/dL    I have Reviewed nursing notes, Vitals, and Lab results since pt's last encounter. Pertinent lab results : see above I have ordered labwork to follow up on.  I have reviewed the last note from staff over past 24  hours I have discussed pt's care plan and test results with nursing staff, CM/SW, and other staff as appropriate  Time spent: Greater than 50% of the 55 minute visit was spent in counseling/coordination of care for the patient as laid out in the A&P.   LOS: 2 days   Dwyane Dee, MD [facility] 03/26/2022, 4:07 PM

## 2022-03-26 NOTE — Progress Notes (Signed)
Gastroenterology Progress Note   Referring Provider: No ref. provider found Primary Care Physician:  Glenda Chroman, MD Primary Gastroenterologist:  Dr. Jenetta Downer  Patient ID: Joanna Sutton; [id number]; January 20, 1956   Subjective:    Patient states her pain is no better. One stool in the last 24 hours.   Objective:   Vital signs in last 24 hours: Temp:  [97 F (36.1 C)-99.9 F (37.7 C)] 97.8 F (36.6 C) (12/21 1248) Pulse Rate:  [87-89] 88 (12/21 1248) Resp:  [16-20] 18 (12/21 1248) BP: (105-127)/(52-75) 127/68 (12/21 1248) SpO2:  [92 %-96 %] 92 % (12/21 1248) Last BM Date : 03/24/22 General:   Alert,  Well-developed, well-nourished, pleasant and cooperative in NAD Head:  Normocephalic and atraumatic. Eyes:  Sclera clear, no icterus.  Abdomen:  Soft, nondistended.Normal bowel sounds, without guarding, and without rebound.  Diffuse mild tenderness. Extremities:  Without clubbing, deformity or edema. Neurologic:  Alert and  oriented x4;  grossly normal neurologically. Skin:  Intact without significant lesions or rashes. Psych:  Alert and cooperative. Normal mood and affect.  Intake/Output from previous day: 12/20 0701 - 12/21 0700 In: 1576.4 [P.O.:1200; I.V.:376.4] Out: 200 [Urine:200] Intake/Output this shift: Total I/O In: 480 [P.O.:480] Out: -   Lab Results: CBC Recent Labs    03/24/22 0322 03/26/22 0322  WBC 11.0* 11.0*  HGB 9.6* 9.8*  HCT 30.7* 31.2*  MCV 100.3* 98.1  PLT 165 237   BMET Recent Labs    03/24/22 0322 03/25/22 0327 03/26/22 0322  NA 140 136 138  K 4.1 3.3* 3.8  CL 112* 108 107  CO2 '22 22 23  '$ GLUCOSE 102* 127* 108*  BUN 14 11 7*  CREATININE 0.77 0.58 0.64  CALCIUM 8.3* 8.3* 8.4*   LFTs No results for input(s): "BILITOT", "BILIDIR", "IBILI", "ALKPHOS", "AST", "ALT", "PROT", "ALBUMIN" in the last 72 hours. No results for input(s): "LIPASE" in the last 72 hours. PT/INR No results for input(s): "LABPROT", "INR" in the last 72  hours.       Imaging Studies: DG Abd 2 Views  Result Date: 03/26/2022 CLINICAL DATA:  Constipation EXAM: ABDOMEN - 2 VIEW COMPARISON:  03/25/2022. FINDINGS: Minimal small bowel dilatation. Stool and air overlies the colon and rectosigmoid. No free air identified. Cholecystectomy clips are noted. No radiopaque stones identified. IMPRESSION: Minimal small bowel dilatation likely representing ileus. No obstruction. No change compared to the prior study. Electronically Signed   By: Sammie Bench M.D.   On: 03/26/2022 09:15   DG Abd 2 Views  Result Date: 03/25/2022 CLINICAL DATA:  Lower abdominal pain EXAM: ABDOMEN - 2 VIEW COMPARISON:  None Available. FINDINGS: Dilated small bowel loops.  Colon decompressed. Surgical clips in the gallbladder fossa. No acute skeletal abnormality. Heavily calcified breast implants bilaterally. IMPRESSION: Dilated small bowel loops consistent with small-bowel obstruction. Progressive small bowel dilatation since the recent CT of 03/22/2022 Electronically Signed   By: Franchot Gallo M.D.   On: 03/25/2022 15:16   DG Chest Port 1 View  Result Date: 03/22/2022 CLINICAL DATA:  Nausea EXAM: PORTABLE CHEST 1 VIEW COMPARISON:  CT 03/22/2022, chest x-ray report 01/15/2014 FINDINGS: Calcified bilateral breast implants. Elevation of the right diaphragm. Linear scarring or atelectasis in the left lower lung. Normal cardiac size. Aortic atherosclerosis. No pneumothorax. IMPRESSION: No active disease. Linear scarring or atelectasis in the left lower lung. Electronically Signed   By: Donavan Foil M.D.   On: 03/22/2022 15:12   CT Abdomen Pelvis W Contrast  Result Date: 03/22/2022  CLINICAL DATA:  Lower abdominal pain, nausea, and diarrhea beginning yesterday. EXAM: CT ABDOMEN AND PELVIS WITH CONTRAST TECHNIQUE: Multidetector CT imaging of the abdomen and pelvis was performed using the standard protocol following bolus administration of intravenous contrast. RADIATION DOSE  REDUCTION: This exam was performed according to the departmental dose-optimization program which includes automated exposure control, adjustment of the mA and/or kV according to patient size and/or use of iterative reconstruction technique. CONTRAST:  17m OMNIPAQUE IOHEXOL 300 MG/ML  SOLN COMPARISON:  None Available. FINDINGS: Lower Chest: No acute findings. Hepatobiliary: No hepatic masses identified. Prior cholecystectomy noted. Mild biliary ductal dilatation noted, likely due to prior cholecystectomy. Pancreas: No mass or inflammatory changes. No evidence of pancreatic ductal dilatation. Spleen: Within normal limits in size and appearance. Adrenals/Urinary Tract: No suspicious masses identified. No evidence of ureteral calculi or hydronephrosis. Stomach/Bowel: Large amount colonic stool is seen. Mild colonic wall thickening pericolonic soft tissue stranding is seen involving the descending and sigmoid colon which is distended by stool, but shows no evidence of diverticular disease. This is consistent with colitis, and differential diagnosis includes infectious etiologies and stercoral colitis. No evidence of abscess. Vascular/Lymphatic: No pathologically enlarged lymph nodes. No acute vascular findings. Aortic atherosclerotic calcification incidentally noted. Reproductive:  No mass or other significant abnormality. Other:  None. Musculoskeletal:  No suspicious bone lesions identified. IMPRESSION: Mild colitis involving the descending and sigmoid colon, with large amount of stool noted. Differential diagnosis includes infectious etiologies and stercoral colitis. Aortic Atherosclerosis (ICD10-I70.0). Electronically Signed   By: JMarlaine HindM.D.   On: 03/22/2022 12:58  [2 weeks]  Assessment:   JAMRIE GURGANUSis a 66year old female with PMH of depression, hypercholesterolemia, HTN, esophagitis, PUD on EGD Oct 2023, multiple polyps in Oct 2023, presenting to the ED with abdominal pain, nausea, and diarrhea. CT  findings of colitis involving descending and sigmoid colon.    Colitis:  acute onset diarrhea Saturday, improved since admission, GI path, c diff negative. Intermittent fever suggesting infectious etiology though large stool burden on CT a few days ago and started on miralax. Potential for stercoral colitis. Her WBC is slightly elevated at 11000. Has been afebrile last 24 hours. She has had two doses of Miralax since 03/23/22, not given the 19th and 20th. Milk of Molasses enema yesterday evening. She had six stools yesterday. One BM today. O/P in stool ordered has not been collected. Patient denies improvement in pain. Abd xray yesterday with progressive small bowel dilation. Today minimal small bowel dilation.    Hx of PUD: noted on EGD in October with nonbleeding gastric ulcer likely secondary to previous heavy goody powder use which she admittedly is still using intermittently. H pylori testing negative previously   Normocytic anemia: hgb 12 on admission. 9.8 yesterday. No overt GI bleeding. Suspect related to hemodilution. Hgb has been stable.    Plan:   Complete 7 day course of antibiotics.  Increase miralax to 17 grams three times daily.  Reassess in the morning. If persistent abdominal pain, consider updated imaging.    LOS: 2 days   LLaureen Ochs LBernarda CaffeyRDeer Lodge Medical CenterGastroenterology Associates [phone] PM

## 2022-03-27 DIAGNOSIS — R1032 Left lower quadrant pain: Secondary | ICD-10-CM | POA: Diagnosis not present

## 2022-03-27 DIAGNOSIS — K529 Noninfective gastroenteritis and colitis, unspecified: Secondary | ICD-10-CM | POA: Diagnosis not present

## 2022-03-27 LAB — CULTURE, BLOOD (ROUTINE X 2)
Culture: NO GROWTH
Culture: NO GROWTH
Special Requests: ADEQUATE
Special Requests: ADEQUATE

## 2022-03-27 LAB — CBC WITH DIFFERENTIAL/PLATELET
Abs Immature Granulocytes: 0.11 10*3/uL — ABNORMAL HIGH (ref 0.00–0.07)
Basophils Absolute: 0 10*3/uL (ref 0.0–0.1)
Basophils Relative: 0 %
Eosinophils Absolute: 0.1 10*3/uL (ref 0.0–0.5)
Eosinophils Relative: 1 %
HCT: 33.4 % — ABNORMAL LOW (ref 36.0–46.0)
Hemoglobin: 10.5 g/dL — ABNORMAL LOW (ref 12.0–15.0)
Immature Granulocytes: 2 %
Lymphocytes Relative: 15 %
Lymphs Abs: 1.2 10*3/uL (ref 0.7–4.0)
MCH: 30.7 pg (ref 26.0–34.0)
MCHC: 31.4 g/dL (ref 30.0–36.0)
MCV: 97.7 fL (ref 80.0–100.0)
Monocytes Absolute: 1 10*3/uL (ref 0.1–1.0)
Monocytes Relative: 13 %
Neutro Abs: 5.2 10*3/uL (ref 1.7–7.7)
Neutrophils Relative %: 69 %
Platelets: 284 10*3/uL (ref 150–400)
RBC: 3.42 MIL/uL — ABNORMAL LOW (ref 3.87–5.11)
RDW: 13.7 % (ref 11.5–15.5)
WBC: 7.6 10*3/uL (ref 4.0–10.5)
nRBC: 0 % (ref 0.0–0.2)

## 2022-03-27 LAB — BASIC METABOLIC PANEL
Anion gap: 6 (ref 5–15)
BUN: <5 mg/dL — ABNORMAL LOW (ref 8–23)
CO2: 26 mmol/L (ref 22–32)
Calcium: 8.6 mg/dL — ABNORMAL LOW (ref 8.9–10.3)
Chloride: 105 mmol/L (ref 98–111)
Creatinine, Ser: 0.67 mg/dL (ref 0.44–1.00)
GFR, Estimated: >60 mL/min (ref >60)
Glucose, Bld: 83 mg/dL (ref 70–99)
Potassium: 3.5 mmol/L (ref 3.5–5.1)
Sodium: 137 mmol/L (ref 135–145)

## 2022-03-27 LAB — MAGNESIUM: Magnesium: 2.1 mg/dL (ref 1.7–2.4)

## 2022-03-27 MED ORDER — POLYETHYLENE GLYCOL 3350 17 G PO PACK
17.0000 g | PACK | Freq: Three times a day (TID) | ORAL | Status: DC
  Administered 2022-03-27 – 2022-04-03 (×15): 17 g via ORAL
  Filled 2022-03-27 (×22): qty 1

## 2022-03-27 MED ORDER — MORPHINE SULFATE (PF) 2 MG/ML IV SOLN
2.0000 mg | Freq: Once | INTRAVENOUS | Status: AC
  Administered 2022-03-27: 2 mg via INTRAVENOUS
  Filled 2022-03-27: qty 1

## 2022-03-27 MED ORDER — MAGNESIUM CITRATE PO SOLN
1.0000 | Freq: Once | ORAL | Status: AC
  Administered 2022-03-27: 1 via ORAL
  Filled 2022-03-27: qty 296

## 2022-03-27 NOTE — Progress Notes (Signed)
Gastroenterology Progress Note   Referring Provider: No ref. provider found Primary Care Physician:  Glenda Chroman, MD Primary Gastroenterologist:  Harvel Quale, MD  Patient ID: Joanna Sutton; [id number]; 03-31-1956    Subjective   Able to tolerate diet however states that some of her options that she has been receiving are too heavy.  Would prefer lighter foods.  Would like some Jell-O and things like soups to eat.  Denies any nausea, vomiting.  Still complains of a lot of cramping in her lower abdomen which she feels like she needs to have a bowel movement and gets up to the bedside commode.  States sometimes this will double her over.  Nursing aide states she has had few small balls of stool with her attempts at bowel movements.  States she has attempted to have a bowel movement 4 times today.  Objective   Vital signs in last 24 hours Temp:  [97.2 F (36.2 C)-97.8 F (36.6 C)] 97.2 F (36.2 C) (12/22 0449) Pulse Rate:  [76-88] 76 (12/22 0449) Resp:  [16-18] 16 (12/22 0449) BP: (127-142)/(68-78) 142/78 (12/22 0449) SpO2:  [92 %-94 %] 94 % (12/22 0449) Last BM Date : 03/27/22 (Small, soft BM. Brown in [employer].)  Physical Exam General:   Alert and oriented, pleasant Head:  Normocephalic and atraumatic. Eyes:  No icterus, sclera clear. Conjuctiva pink.  Mouth:  Without lesions, mucosa pink and moist.  Heart:  S1, S2 present, no murmurs noted.  Lungs: Clear to auscultation bilaterally, without wheezing, rales, or rhonchi.  Abdomen:  Bowel sounds present.  Mild distention.  Moderate tenderness throughout abdomen.  No HSM or hernias noted. No rebound or guarding. No masses appreciated  Extremities:  Without clubbing or edema. Neurologic:  Alert and  oriented x4;  grossly normal neurologically. Skin:  Warm and dry, intact without significant lesions.  Psych:  Alert and cooperative. Normal mood and affect.  Intake/Output from previous day: 12/21 0701 - 12/22 0700 In:  1600 [P.O.:1200; IV Piggyback:400] Out: -  Intake/Output this shift: Total I/O In: 240 [P.O.:240] Out: -   Lab Results  Recent Labs    03/26/22 0322 03/27/22 0318  WBC 11.0* 7.6  HGB 9.8* 10.5*  HCT 31.2* 33.4*  PLT 237 284   BMET Recent Labs    03/25/22 0327 03/26/22 0322 03/27/22 0318  NA 136 138 137  K 3.3* 3.8 3.5  CL 108 107 105  CO2 '22 23 26  '$ GLUCOSE 127* 108* 83  BUN 11 7* <5*  CREATININE 0.58 0.64 0.67  CALCIUM 8.3* 8.4* 8.6*   LFT No results for input(s): "PROT", "ALBUMIN", "AST", "ALT", "ALKPHOS", "BILITOT", "BILIDIR", "IBILI" in the last 72 hours. PT/INR No results for input(s): "LABPROT", "INR" in the last 72 hours. Hepatitis Panel No results for input(s): "HEPBSAG", "HCVAB", "HEPAIGM", "HEPBIGM" in the last 72 hours.  Studies/Results DG Abd 2 Views  Result Date: 03/26/2022 CLINICAL DATA:  Constipation EXAM: ABDOMEN - 2 VIEW COMPARISON:  03/25/2022. FINDINGS: Minimal small bowel dilatation. Stool and air overlies the colon and rectosigmoid. No free air identified. Cholecystectomy clips are noted. No radiopaque stones identified. IMPRESSION: Minimal small bowel dilatation likely representing ileus. No obstruction. No change compared to the prior study. Electronically Signed   By: Sammie Bench M.D.   On: 03/26/2022 09:15   DG Abd 2 Views  Result Date: 03/25/2022 CLINICAL DATA:  Lower abdominal pain EXAM: ABDOMEN - 2 VIEW COMPARISON:  None Available. FINDINGS: Dilated small bowel loops.  Colon decompressed. Surgical  clips in the gallbladder fossa. No acute skeletal abnormality. Heavily calcified breast implants bilaterally. IMPRESSION: Dilated small bowel loops consistent with small-bowel obstruction. Progressive small bowel dilatation since the recent CT of 03/22/2022 Electronically Signed   By: Franchot Gallo M.D.   On: 03/25/2022 15:16   DG Chest Port 1 View  Result Date: 03/22/2022 CLINICAL DATA:  Nausea EXAM: PORTABLE CHEST 1 VIEW COMPARISON:   CT 03/22/2022, chest x-ray report 01/15/2014 FINDINGS: Calcified bilateral breast implants. Elevation of the right diaphragm. Linear scarring or atelectasis in the left lower lung. Normal cardiac size. Aortic atherosclerosis. No pneumothorax. IMPRESSION: No active disease. Linear scarring or atelectasis in the left lower lung. Electronically Signed   By: Donavan Foil M.D.   On: 03/22/2022 15:12   CT Abdomen Pelvis W Contrast  Result Date: 03/22/2022 CLINICAL DATA:  Lower abdominal pain, nausea, and diarrhea beginning yesterday. EXAM: CT ABDOMEN AND PELVIS WITH CONTRAST TECHNIQUE: Multidetector CT imaging of the abdomen and pelvis was performed using the standard protocol following bolus administration of intravenous contrast. RADIATION DOSE REDUCTION: This exam was performed according to the departmental dose-optimization program which includes automated exposure control, adjustment of the mA and/or kV according to patient size and/or use of iterative reconstruction technique. CONTRAST:  179m OMNIPAQUE IOHEXOL 300 MG/ML  SOLN COMPARISON:  None Available. FINDINGS: Lower Chest: No acute findings. Hepatobiliary: No hepatic masses identified. Prior cholecystectomy noted. Mild biliary ductal dilatation noted, likely due to prior cholecystectomy. Pancreas: No mass or inflammatory changes. No evidence of pancreatic ductal dilatation. Spleen: Within normal limits in size and appearance. Adrenals/Urinary Tract: No suspicious masses identified. No evidence of ureteral calculi or hydronephrosis. Stomach/Bowel: Large amount colonic stool is seen. Mild colonic wall thickening pericolonic soft tissue stranding is seen involving the descending and sigmoid colon which is distended by stool, but shows no evidence of diverticular disease. This is consistent with colitis, and differential diagnosis includes infectious etiologies and stercoral colitis. No evidence of abscess. Vascular/Lymphatic: No pathologically enlarged lymph  nodes. No acute vascular findings. Aortic atherosclerotic calcification incidentally noted. Reproductive:  No mass or other significant abnormality. Other:  None. Musculoskeletal:  No suspicious bone lesions identified. IMPRESSION: Mild colitis involving the descending and sigmoid colon, with large amount of stool noted. Differential diagnosis includes infectious etiologies and stercoral colitis. Aortic Atherosclerosis (ICD10-I70.0). Electronically Signed   By: JMarlaine HindM.D.   On: 03/22/2022 12:58    Assessment  66y.o. female with a history of depression, hypercholesteremia, HTN, esophagitis, PUD on EGD October 2023, multiple polyps on TCS  Colitis: Had acute onset diarrhea last Saturday.  Diarrhea improved on admission.  Has actually been experiencing some constipation the last.  Stool testing was negative including C. difficile she had intermittent fever suggesting however she had large stool burden on CT few days ago started on MiraLAX.  Concern for stercoral colitis.  Had elevation in her WBC on admission however today.  It appears she only been getting MiraLAX once daily.  Given medical molasses enema on Wednesday and had 6 stools.  Only 1 bowel movement yesterday.  States she has had multiple bowel movements today however reported she is only had small little balls of stool.  S/p order has been placed but not collected given diarrhea.  She continues to report generalized abdominal pain, more so in the lower abdomen especially when needed to have a bowel.  She has had multiple KUBs with progressive small bowel dilation with some improvement yesterday.  Plan to increase MiraLAX to 3  times daily however is only receiving once daily, order changed today and will trial increasing MiraLAX and if persistent abdominal pain or no improvement will reassess with abdominal imaging.  Will complete 7-day course of antibiotic limit opiate use.  Hx of PUD: EGD in October with nonbleeding gastric ulcer secondary to  previous aspirin use.  H. pylori testing negative.  Continue daily PPI.  Normocytic anemia: Hemoglobin 12 on admission.  Dropped to 9.6, it is 10.5 today. No overt GI bleeding. Likely previous drop due to hemodilution.   Plan / Recommendations  Increase MiraLAX to 3 times daily, order changed today Continue 7-day course of antibiotics Limit opiates Frequent ambulation encouraged Continue GI soft diet. If increase in miralax not helpful would consider repeating abdominal imaging.  Daily PPI    LOS: 3 days    03/27/2022, 12:29 PM   Venetia Night, MSN, FNP-BC, AGACNP-BC [facility]

## 2022-03-27 NOTE — Progress Notes (Signed)
Progress Note    Joanna Sutton   PPJ:[id number]  DOB: 1955/08/04  DOA: 03/22/2022     3 PCP: Glenda Chroman, MD  Initial CC: Abdominal pain, nausea, vomiting, diarrhea  Hospital Course: Joanna Sutton is a 66 yo female with PMH HTN, diverticulosis, PUD who presented with abdominal pain, nausea/vomiting/diarrhea.  She underwent CT abdomen/pelvis which showed mild colitis involving descending and sigmoid colon with large amount of stool noted.  Concern was for infectious etiology.  GI was consulted.  She also underwent stool testing with C. difficile and GI pathogen panel which were both negative.  She was continued on empiric antibiotics.  Interval History:  Not too much improvement overnight still.  Still not much of a bowel movement after increase in MiraLAX yesterday.  Also endorses ongoing abdominal pain still but may be some improvement in her nausea and still no vomiting.  Assessment and Plan:  Gastroenteritis/ Colitis She was started empirically on admission Rocephin and Flagyl.;  GI recommends completing 7-day course C. difficile PCR negative SARS-CoV-2 and influenza were negative GI PCR panel also negative -Slowly advancing diet and monitoring for tolerance - Continue nausea control  Constipation/Ileus  - No obvious obstruction on imaging and no worsening of nausea/vomiting.  Still not much output from bowel movements despite enema and bowel regimen -MiraLAX increased per GI - will try mag citrate if avail - Oxycodone discontinued.  Use Tylenol as able   Hypokalemia -Repleted   Essential hypertension: Acute renal antihypertensive medications continue to monitor vitals.   Hyperlipidemia: Continue statins.   History of peptic ulcer disease/dysphagia: Continue Protonix p.o. twice daily.   Depression: Continue Lexapro.   Old records reviewed in assessment of this patient  Antimicrobials: Rocephin 03/22/2022 >> current Flagyl 03/23/2022 >> current  DVT  prophylaxis:  enoxaparin (LOVENOX) injection 40 mg Start: 03/22/22 1700   Code Status:   Code Status: Full Code  Mobility Assessment (last 72 hours)     Mobility Assessment     Row Name 03/27/22 5809 03/26/22 2346 03/26/22 0801 03/25/22 2000 03/24/22 1950   Does patient have an order for bedrest or is patient medically unstable No - Continue assessment No - Continue assessment No - Continue assessment No - Continue assessment No - Continue assessment   What is the highest level of mobility based on the progressive mobility assessment? Level 4 (Walks with assist in room) - Balance while marching in place and cannot step forward and back - Complete Level 4 (Walks with assist in room) - Balance while marching in place and cannot step forward and back - Complete Level 4 (Walks with assist in room) - Balance while marching in place and cannot step forward and back - Complete Level 4 (Walks with assist in room) - Balance while marching in place and cannot step forward and back - Complete Level 4 (Walks with assist in room) - Balance while marching in place and cannot step forward and back - Complete            Barriers to discharge:  Disposition Plan: Home 1 to 2 days Status is: Inpatient  Objective: Blood pressure (!) 144/78, pulse 80, temperature (!) 97.2 F (36.2 C), resp. rate 17, height '5\' 1"'$  (1.549 m), weight 62.8 kg, SpO2 95 %.  Examination:  Physical Exam Constitutional:      Appearance: She is well-developed.  HENT:     Head: Normocephalic and atraumatic.  Eyes:     Pupils: Pupils are equal, round, and reactive to  light.  Cardiovascular:     Rate and Rhythm: Normal rate and regular rhythm.  Pulmonary:     Effort: Pulmonary effort is normal. No respiratory distress.     Breath sounds: Normal breath sounds. No wheezing.  Abdominal:     General: Bowel sounds are normal. There is no distension.     Palpations: Abdomen is soft.  Musculoskeletal:        General: Normal range  of motion.     Cervical back: Normal range of motion and neck supple.  Skin:    General: Skin is warm and dry.  Neurological:     General: No focal deficit present.     Mental Status: She is alert.  Psychiatric:        Mood and Affect: Mood normal.      Consultants:  GI  Procedures:    Data Reviewed: Results for orders placed or performed during the hospital encounter of 03/22/22 (from the past 24 hour(s))  CBC with Differential/Platelet     Status: Abnormal   Collection Time: 03/27/22  3:18 AM  Result Value Ref Range   WBC 7.6 4.0 - 10.5 K/uL   RBC 3.42 (L) 3.87 - 5.11 MIL/uL   Hemoglobin 10.5 (L) 12.0 - 15.0 g/dL   HCT 33.4 (L) 36.0 - 46.0 %   MCV 97.7 80.0 - 100.0 fL   MCH 30.7 26.0 - 34.0 pg   MCHC 31.4 30.0 - 36.0 g/dL   RDW 13.7 11.5 - 15.5 %   Platelets 284 150 - 400 K/uL   nRBC 0.0 0.0 - 0.2 %   Neutrophils Relative % 69 %   Neutro Abs 5.2 1.7 - 7.7 K/uL   Lymphocytes Relative 15 %   Lymphs Abs 1.2 0.7 - 4.0 K/uL   Monocytes Relative 13 %   Monocytes Absolute 1.0 0.1 - 1.0 K/uL   Eosinophils Relative 1 %   Eosinophils Absolute 0.1 0.0 - 0.5 K/uL   Basophils Relative 0 %   Basophils Absolute 0.0 0.0 - 0.1 K/uL   Immature Granulocytes 2 %   Abs Immature Granulocytes 0.11 (H) 0.00 - 0.07 K/uL  Magnesium     Status: None   Collection Time: 03/27/22  3:18 AM  Result Value Ref Range   Magnesium 2.1 1.7 - 2.4 mg/dL  Basic metabolic panel     Status: Abnormal   Collection Time: 03/27/22  3:18 AM  Result Value Ref Range   Sodium 137 135 - 145 mmol/L   Potassium 3.5 3.5 - 5.1 mmol/L   Chloride 105 98 - 111 mmol/L   CO2 26 22 - 32 mmol/L   Glucose, Bld 83 70 - 99 mg/dL   BUN <5 (L) 8 - 23 mg/dL   Creatinine, Ser 0.67 0.44 - 1.00 mg/dL   Calcium 8.6 (L) 8.9 - 10.3 mg/dL   GFR, Estimated >60 >60 mL/min   Anion gap 6 5 - 15    I have Reviewed nursing notes, Vitals, and Lab results since pt's last encounter. Pertinent lab results : see above I have ordered  labwork to follow up on.  I have reviewed the last note from staff over past 24 hours I have discussed pt's care plan and test results with nursing staff, CM/SW, and other staff as appropriate  Time spent: Greater than 50% of the 55 minute visit was spent in counseling/coordination of care for the patient as laid out in the A&P.   LOS: 3 days   Dwyane Dee, MD  [facility] 03/27/2022, 2:57 PM

## 2022-03-27 NOTE — Care Management Important Message (Signed)
Important Message  Patient Details  Name: Joanna Sutton MRN: [id number] Date of Birth: 1955/05/06   Medicare Important Message Given:  Yes     Tommy Medal 03/27/2022, 9:10 AM

## 2022-03-28 ENCOUNTER — Inpatient Hospital Stay (HOSPITAL_COMMUNITY): Payer: PPO

## 2022-03-28 DIAGNOSIS — K5904 Chronic idiopathic constipation: Secondary | ICD-10-CM | POA: Diagnosis not present

## 2022-03-28 DIAGNOSIS — E876 Hypokalemia: Secondary | ICD-10-CM | POA: Insufficient documentation

## 2022-03-28 DIAGNOSIS — R197 Diarrhea, unspecified: Secondary | ICD-10-CM | POA: Diagnosis not present

## 2022-03-28 DIAGNOSIS — R1032 Left lower quadrant pain: Secondary | ICD-10-CM | POA: Diagnosis not present

## 2022-03-28 DIAGNOSIS — F32A Depression, unspecified: Secondary | ICD-10-CM | POA: Insufficient documentation

## 2022-03-28 DIAGNOSIS — K529 Noninfective gastroenteritis and colitis, unspecified: Secondary | ICD-10-CM | POA: Diagnosis not present

## 2022-03-28 LAB — CBC WITH DIFFERENTIAL/PLATELET
Abs Immature Granulocytes: 0.24 10*3/uL — ABNORMAL HIGH (ref 0.00–0.07)
Basophils Absolute: 0.1 10*3/uL (ref 0.0–0.1)
Basophils Relative: 1 %
Eosinophils Absolute: 0 10*3/uL (ref 0.0–0.5)
Eosinophils Relative: 0 %
HCT: 30.6 % — ABNORMAL LOW (ref 36.0–46.0)
Hemoglobin: 10.1 g/dL — ABNORMAL LOW (ref 12.0–15.0)
Immature Granulocytes: 3 %
Lymphocytes Relative: 10 %
Lymphs Abs: 0.9 10*3/uL (ref 0.7–4.0)
MCH: 31.2 pg (ref 26.0–34.0)
MCHC: 33 g/dL (ref 30.0–36.0)
MCV: 94.4 fL (ref 80.0–100.0)
Monocytes Absolute: 1.1 10*3/uL — ABNORMAL HIGH (ref 0.1–1.0)
Monocytes Relative: 11 %
Neutro Abs: 7 10*3/uL (ref 1.7–7.7)
Neutrophils Relative %: 75 %
Platelets: 335 10*3/uL (ref 150–400)
RBC: 3.24 MIL/uL — ABNORMAL LOW (ref 3.87–5.11)
RDW: 13.4 % (ref 11.5–15.5)
WBC: 9.3 10*3/uL (ref 4.0–10.5)
nRBC: 0 % (ref 0.0–0.2)

## 2022-03-28 LAB — BASIC METABOLIC PANEL
Anion gap: 9 (ref 5–15)
BUN: <5 mg/dL — ABNORMAL LOW (ref 8–23)
CO2: 28 mmol/L (ref 22–32)
Calcium: 8 mg/dL — ABNORMAL LOW (ref 8.9–10.3)
Chloride: 101 mmol/L (ref 98–111)
Creatinine, Ser: 0.57 mg/dL (ref 0.44–1.00)
GFR, Estimated: >60 mL/min (ref >60)
Glucose, Bld: 106 mg/dL — ABNORMAL HIGH (ref 70–99)
Potassium: 3.1 mmol/L — ABNORMAL LOW (ref 3.5–5.1)
Sodium: 138 mmol/L (ref 135–145)

## 2022-03-28 LAB — MAGNESIUM: Magnesium: 2.4 mg/dL (ref 1.7–2.4)

## 2022-03-28 MED ORDER — POTASSIUM CHLORIDE 20 MEQ PO PACK
40.0000 meq | PACK | Freq: Once | ORAL | Status: AC
  Administered 2022-03-28: 40 meq via ORAL
  Filled 2022-03-28: qty 2

## 2022-03-28 MED ORDER — POTASSIUM CHLORIDE CRYS ER 20 MEQ PO TBCR
40.0000 meq | EXTENDED_RELEASE_TABLET | ORAL | Status: DC
  Administered 2022-03-28: 40 meq via ORAL
  Filled 2022-03-28 (×2): qty 2

## 2022-03-28 MED ORDER — IOHEXOL 9 MG/ML PO SOLN
ORAL | Status: AC
  Filled 2022-03-28: qty 1000

## 2022-03-28 MED ORDER — IOHEXOL 9 MG/ML PO SOLN
500.0000 mL | ORAL | Status: AC
  Administered 2022-03-28: 500 mL via ORAL

## 2022-03-28 MED ORDER — IOHEXOL 300 MG/ML  SOLN
100.0000 mL | Freq: Once | INTRAMUSCULAR | Status: AC | PRN
  Administered 2022-03-28: 100 mL via INTRAVENOUS

## 2022-03-28 NOTE — Plan of Care (Signed)

## 2022-03-28 NOTE — Progress Notes (Signed)
Progress Note    Joanna Sutton   KCL:[id number]  DOB: Jul 19, 1955  DOA: 03/22/2022     4 PCP: Glenda Chroman, MD  Initial CC: Abdominal pain, nausea, vomiting, diarrhea  Hospital Course: Joanna Sutton is a 66 yo female with PMH HTN, diverticulosis, PUD who presented with abdominal pain, nausea/vomiting/diarrhea.  She underwent CT abdomen/pelvis which showed mild colitis involving descending and sigmoid colon with large amount of stool noted.  Concern was for infectious etiology.  GI was consulted.  She also underwent stool testing with C. difficile and GI pathogen panel which were both negative.  She was continued on empiric antibiotics.  Interval History:  She vomited with mag citrate yesterday followed by severe abd pain (better after needing morphine). Still hardly much stool and has ongoing abd distension/pain.  Discussed we'd repeat CT A/P today since has been 6 days from last without much improvement.    Assessment and Plan:  Abdominal pain - ongoing poor BM and ongoing distension with episode of vomiting yesterday with trial of further laxative and still just picking at food - repeat xray this morning shows dilated SB and no air in colon, raising concern for obstruction of some sort - last CT A/P on 12/17 mostly noting descending and sigmoid mild colitis with large stool burden - Opioids have also been limited, still could just be ileus with large stool burden but needing to still repeat imaging to rule in/out obstruction - Repeat CT abdomen/pelvis ordered for today, follow-up results  Gastroenteritis/ Colitis She was started empirically on admission Rocephin and Flagyl.;  GI recommended completing 7-day course C. difficile PCR negative SARS-CoV-2 and influenza were negative GI PCR panel also negative - Continue nausea control  Hypokalemia -Repleted   Essential hypertension: Acute renal antihypertensive medications continue to monitor vitals.   Hyperlipidemia: Continue  statins.   History of peptic ulcer disease/dysphagia: Continue Protonix p.o. twice daily.   Depression: Continue Lexapro.   Old records reviewed in assessment of this patient  Antimicrobials: Rocephin 03/22/2022 >> current Flagyl 03/23/2022 >> current  DVT prophylaxis:  enoxaparin (LOVENOX) injection 40 mg Start: 03/22/22 1700   Code Status:   Code Status: Full Code  Mobility Assessment (last 72 hours)     Mobility Assessment     Row Name 03/27/22 4944 03/26/22 2346 03/26/22 0801 03/25/22 2000     Does patient have an order for bedrest or is patient medically unstable No - Continue assessment No - Continue assessment No - Continue assessment No - Continue assessment    What is the highest level of mobility based on the progressive mobility assessment? Level 4 (Walks with assist in room) - Balance while marching in place and cannot step forward and back - Complete Level 4 (Walks with assist in room) - Balance while marching in place and cannot step forward and back - Complete Level 4 (Walks with assist in room) - Balance while marching in place and cannot step forward and back - Complete Level 4 (Walks with assist in room) - Balance while marching in place and cannot step forward and back - Complete             Barriers to discharge:  Disposition Plan: Home 1 to 2 days Status is: Inpatient  Objective: Blood pressure 128/73, pulse 70, temperature 98.1 F (36.7 C), resp. rate 16, height '5\' 1"'$  (1.549 m), weight 62.8 kg, SpO2 94 %.  Examination:  Physical Exam Constitutional:      Appearance: She is well-developed.  HENT:     Head: Normocephalic and atraumatic.  Eyes:     Pupils: Pupils are equal, round, and reactive to light.  Cardiovascular:     Rate and Rhythm: Normal rate and regular rhythm.  Pulmonary:     Effort: Pulmonary effort is normal. No respiratory distress.     Breath sounds: Normal breath sounds. No wheezing.  Abdominal:     Comments: Ongoing mild  distention with generalized abdominal pain/tenderness but worse in lower quadrants; no rebound or guarding.  Does have bowel sounds noted in all 4 quadrants  Musculoskeletal:        General: Normal range of motion.     Cervical back: Normal range of motion and neck supple.  Skin:    General: Skin is warm and dry.  Neurological:     General: No focal deficit present.     Mental Status: She is alert.  Psychiatric:        Mood and Affect: Mood normal.      Consultants:  GI  Procedures:    Data Reviewed: Results for orders placed or performed during the hospital encounter of 03/22/22 (from the past 24 hour(s))  CBC with Differential/Platelet     Status: Abnormal   Collection Time: 03/28/22  7:03 AM  Result Value Ref Range   WBC 9.3 4.0 - 10.5 K/uL   RBC 3.24 (L) 3.87 - 5.11 MIL/uL   Hemoglobin 10.1 (L) 12.0 - 15.0 g/dL   HCT 30.6 (L) 36.0 - 46.0 %   MCV 94.4 80.0 - 100.0 fL   MCH 31.2 26.0 - 34.0 pg   MCHC 33.0 30.0 - 36.0 g/dL   RDW 13.4 11.5 - 15.5 %   Platelets 335 150 - 400 K/uL   nRBC 0.0 0.0 - 0.2 %   Neutrophils Relative % 75 %   Neutro Abs 7.0 1.7 - 7.7 K/uL   Lymphocytes Relative 10 %   Lymphs Abs 0.9 0.7 - 4.0 K/uL   Monocytes Relative 11 %   Monocytes Absolute 1.1 (H) 0.1 - 1.0 K/uL   Eosinophils Relative 0 %   Eosinophils Absolute 0.0 0.0 - 0.5 K/uL   Basophils Relative 1 %   Basophils Absolute 0.1 0.0 - 0.1 K/uL   Immature Granulocytes 3 %   Abs Immature Granulocytes 0.24 (H) 0.00 - 0.07 K/uL  Magnesium     Status: None   Collection Time: 03/28/22  7:03 AM  Result Value Ref Range   Magnesium 2.4 1.7 - 2.4 mg/dL  Basic metabolic panel     Status: Abnormal   Collection Time: 03/28/22  7:03 AM  Result Value Ref Range   Sodium 138 135 - 145 mmol/L   Potassium 3.1 (L) 3.5 - 5.1 mmol/L   Chloride 101 98 - 111 mmol/L   CO2 28 22 - 32 mmol/L   Glucose, Bld 106 (H) 70 - 99 mg/dL   BUN <5 (L) 8 - 23 mg/dL   Creatinine, Ser 0.57 0.44 - 1.00 mg/dL   Calcium  8.0 (L) 8.9 - 10.3 mg/dL   GFR, Estimated >60 >60 mL/min   Anion gap 9 5 - 15    I have Reviewed nursing notes, Vitals, and Lab results since pt's last encounter. Pertinent lab results : see above I have ordered labwork to follow up on.  I have reviewed the last note from staff over past 24 hours I have discussed pt's care plan and test results with nursing staff, CM/SW, and other staff as appropriate  Time spent: Greater than 50% of the 55 minute visit was spent in counseling/coordination of care for the patient as laid out in the A&P.   LOS: 4 days   Dwyane Dee, MD [facility] 03/28/2022, 3:25 PM

## 2022-03-28 NOTE — Progress Notes (Addendum)
Subjective: Patient notes feeling about the same today.  Few loose bowel movements.  Some abdominal discomfort.  Eating some food.  Repeat x-ray this morning shows findings small bowel dilation without colonic dilation, possible small bowel obstruction versus adynamic ileus.  Objective: Vital signs in last 24 hours: Temp:  [98.2 F (36.8 C)-98.8 F (37.1 C)] 98.8 F (37.1 C) (12/23 0359) Pulse Rate:  [79-82] 79 (12/23 0359) Resp:  [18] 18 (12/23 0359) BP: (120-131)/(68-75) 131/75 (12/23 0359) SpO2:  [93 %-97 %] 97 % (12/23 0359) Last BM Date : 03/28/22 General:   Alert and oriented, pleasant Head:  Normocephalic and atraumatic. Eyes:  No icterus, sclera clear. Conjuctiva pink.  Abdomen:  Bowel sounds present, soft, non-tender, distended. No HSM or hernias noted. No rebound or guarding. No masses appreciated  Msk:  Symmetrical without gross deformities. Normal posture. Extremities:  Without clubbing or edema. Neurologic:  Alert and  oriented x4;  grossly normal neurologically. Skin:  Warm and dry, intact without significant lesions.  Cervical Nodes:  No significant cervical adenopathy. Psych:  Alert and cooperative. Normal mood and affect.  Intake/Output from previous day: 12/22 0701 - 12/23 0700 In: 840 [P.O.:840] Out: -  Intake/Output this shift: Total I/O In: 240 [P.O.:240] Out: 1 [Stool:1]  Lab Results: Recent Labs    03/26/22 0322 03/27/22 0318 03/28/22 0703  WBC 11.0* 7.6 9.3  HGB 9.8* 10.5* 10.1*  HCT 31.2* 33.4* 30.6*  PLT 237 284 335   BMET Recent Labs    03/26/22 0322 03/27/22 0318 03/28/22 0703  NA 138 137 138  K 3.8 3.5 3.1*  CL 107 105 101  CO2 '23 26 28  '$ GLUCOSE 108* 83 106*  BUN 7* <5* <5*  CREATININE 0.64 0.67 0.57  CALCIUM 8.4* 8.6* 8.0*   LFT No results for input(s): "PROT", "ALBUMIN", "AST", "ALT", "ALKPHOS", "BILITOT", "BILIDIR", "IBILI" in the last 72 hours. PT/INR No results for input(s): "LABPROT", "INR" in the last 72  hours. Hepatitis Panel No results for input(s): "HEPBSAG", "HCVAB", "HEPAIGM", "HEPBIGM" in the last 72 hours.   Studies/Results: DG Abd Portable 1V  Result Date: 03/28/2022 CLINICAL DATA:  Follow-up ileus. EXAM: PORTABLE ABDOMEN - 1 VIEW COMPARISON:  CT, 03/22/2022. Abdominal radiographs, 03/26/2022 and 03/25/2022. FINDINGS: Dilated central small bowel is without significant change from the most recent prior exam. Scattered air is seen within a normal caliber colon. No gross free air on this supine exam. IMPRESSION: 1. Persistent central abdominal small bowel dilation without colonic dilation. Findings may reflect a persistent small-bowel adynamic ileus or a partial obstruction. Electronically Signed   By: Lajean Manes M.D.   On: 03/28/2022 11:03    Assessment: *Colitis- *Small bowel dilation-ileus versus small bowel obstruction *Abdominal pain *Diarrhea  Plan: Patient without much improvement, despite supportive care and abx. Agree with repeat CT abd/pelvis to further evaluate.  May need surgical input pending findings.  Antibiotics for total 7 days.  Soft diet.  Daily PPI.  Elon Alas. Abbey Chatters, D.O. Gastroenterology and Hepatology [facility]   LOS: 4 days    03/28/2022, 1:38 PM  ADDENDUM 1730:  Repeat CT abdomen pelvis with evidence of diffuse wall thickening of the distal descending colon and sigmoid colon consistent with inflammatory infectious colitis.  This is not improved.  Also with new loculated fluid collections suggesting multiple pericolonic abscesses.  There is also new mucosal thickening and narrowing of the lumen of the small bowel suggesting inflammatory infectious ileitis.  Partial small bowel obstruction.  Also noted large 9.8  x 5.6 cm loculated fluid collection in the lower pelvic cavity possible early abscess versus seroma.  This is anterior to the rectum.  Significant change compared to prior.  Agree with surgical  consultation.  May need interventional radiology involvement as well.  Continue on IV antibiotics. Discussed with hospitalist

## 2022-03-28 NOTE — Progress Notes (Signed)
Patient slept throughout the night, she denies any discomfort or pain at this time.

## 2022-03-29 DIAGNOSIS — A419 Sepsis, unspecified organism: Secondary | ICD-10-CM | POA: Diagnosis not present

## 2022-03-29 DIAGNOSIS — R197 Diarrhea, unspecified: Secondary | ICD-10-CM | POA: Diagnosis not present

## 2022-03-29 DIAGNOSIS — R1084 Generalized abdominal pain: Secondary | ICD-10-CM

## 2022-03-29 DIAGNOSIS — K529 Noninfective gastroenteritis and colitis, unspecified: Secondary | ICD-10-CM | POA: Diagnosis not present

## 2022-03-29 DIAGNOSIS — R1032 Left lower quadrant pain: Secondary | ICD-10-CM | POA: Diagnosis not present

## 2022-03-29 LAB — BASIC METABOLIC PANEL
Anion gap: 7 (ref 5–15)
BUN: <5 mg/dL — ABNORMAL LOW (ref 8–23)
CO2: 27 mmol/L (ref 22–32)
Calcium: 8.2 mg/dL — ABNORMAL LOW (ref 8.9–10.3)
Chloride: 103 mmol/L (ref 98–111)
Creatinine, Ser: 0.58 mg/dL (ref 0.44–1.00)
GFR, Estimated: >60 mL/min (ref >60)
Glucose, Bld: 91 mg/dL (ref 70–99)
Potassium: 3.9 mmol/L (ref 3.5–5.1)
Sodium: 137 mmol/L (ref 135–145)

## 2022-03-29 LAB — CBC WITH DIFFERENTIAL/PLATELET
Abs Immature Granulocytes: 0.4 10*3/uL — ABNORMAL HIGH (ref 0.00–0.07)
Basophils Absolute: 0.1 10*3/uL (ref 0.0–0.1)
Basophils Relative: 1 %
Eosinophils Absolute: 0.1 10*3/uL (ref 0.0–0.5)
Eosinophils Relative: 1 %
HCT: 31.3 % — ABNORMAL LOW (ref 36.0–46.0)
Hemoglobin: 10.3 g/dL — ABNORMAL LOW (ref 12.0–15.0)
Immature Granulocytes: 4 %
Lymphocytes Relative: 14 %
Lymphs Abs: 1.5 10*3/uL (ref 0.7–4.0)
MCH: 31.5 pg (ref 26.0–34.0)
MCHC: 32.9 g/dL (ref 30.0–36.0)
MCV: 95.7 fL (ref 80.0–100.0)
Monocytes Absolute: 1.1 10*3/uL — ABNORMAL HIGH (ref 0.1–1.0)
Monocytes Relative: 11 %
Neutro Abs: 7 10*3/uL (ref 1.7–7.7)
Neutrophils Relative %: 69 %
Platelets: 405 10*3/uL — ABNORMAL HIGH (ref 150–400)
RBC: 3.27 MIL/uL — ABNORMAL LOW (ref 3.87–5.11)
RDW: 13.2 % (ref 11.5–15.5)
WBC: 10.1 10*3/uL (ref 4.0–10.5)
nRBC: 0.3 % — ABNORMAL HIGH (ref 0.0–0.2)

## 2022-03-29 LAB — MAGNESIUM: Magnesium: 2.4 mg/dL (ref 1.7–2.4)

## 2022-03-29 MED ORDER — SODIUM CHLORIDE 0.9 % IV SOLN
1.0000 g | Freq: Three times a day (TID) | INTRAVENOUS | Status: DC
  Administered 2022-03-29 – 2022-03-31 (×6): 1 g via INTRAVENOUS
  Filled 2022-03-29 (×5): qty 20

## 2022-03-29 MED ORDER — SODIUM CHLORIDE 0.9 % IV SOLN
2.0000 g | Freq: Three times a day (TID) | INTRAVENOUS | Status: DC
  Filled 2022-03-29 (×4): qty 40

## 2022-03-29 MED ORDER — SODIUM CHLORIDE 0.9 % IV SOLN
1.0000 g | Freq: Three times a day (TID) | INTRAVENOUS | Status: DC
  Administered 2022-03-29 – 2022-04-03 (×15): 1 g via INTRAVENOUS
  Filled 2022-03-29 (×15): qty 20

## 2022-03-29 NOTE — Progress Notes (Signed)
Subjective: Patient notes feeling about the same today.  Few loose bowel movements.  Left lower quadrant abdominal discomfort.  Objective: Vital signs in last 24 hours: Temp:  [97.8 F (36.6 C)-98.3 F (36.8 C)] 97.8 F (36.6 C) (12/24 0447) Pulse Rate:  [61-75] 61 (12/24 0447) Resp:  [16-18] 18 (12/24 0447) BP: (128-138)/(67-76) 133/76 (12/24 0447) SpO2:  [94 %-95 %] 94 % (12/24 0447) Last BM Date : 03/28/22 General:   Alert and oriented, pleasant Head:  Normocephalic and atraumatic. Eyes:  No icterus, sclera clear. Conjuctiva pink.  Abdomen:  Bowel sounds present, soft, non-tender, distended. No HSM or hernias noted. No rebound or guarding. No masses appreciated  Msk:  Symmetrical without gross deformities. Normal posture. Extremities:  Without clubbing or edema. Neurologic:  Alert and  oriented x4;  grossly normal neurologically. Skin:  Warm and dry, intact without significant lesions.  Cervical Nodes:  No significant cervical adenopathy. Psych:  Alert and cooperative. Normal mood and affect.  Intake/Output from previous day: 12/23 0701 - 12/24 0700 In: 720 [P.O.:720] Out: 1 [Stool:1] Intake/Output this shift: No intake/output data recorded.  Lab Results: Recent Labs    03/27/22 0318 03/28/22 0703 03/29/22 0404  WBC 7.6 9.3 10.1  HGB 10.5* 10.1* 10.3*  HCT 33.4* 30.6* 31.3*  PLT 284 335 405*    BMET Recent Labs    03/27/22 0318 03/28/22 0703 03/29/22 0404  NA 137 138 137  K 3.5 3.1* 3.9  CL 105 101 103  CO2 '26 28 27  '$ GLUCOSE 83 106* 91  BUN <5* <5* <5*  CREATININE 0.67 0.57 0.58  CALCIUM 8.6* 8.0* 8.2*    LFT No results for input(s): "PROT", "ALBUMIN", "AST", "ALT", "ALKPHOS", "BILITOT", "BILIDIR", "IBILI" in the last 72 hours. PT/INR No results for input(s): "LABPROT", "INR" in the last 72 hours. Hepatitis Panel No results for input(s): "HEPBSAG", "HCVAB", "HEPAIGM", "HEPBIGM" in the last 72 hours.   Studies/Results: CT ABDOMEN PELVIS W  CONTRAST  Result Date: 03/28/2022 CLINICAL DATA:  Abdominal pain, vomiting EXAM: CT ABDOMEN AND PELVIS WITH CONTRAST TECHNIQUE: Multidetector CT imaging of the abdomen and pelvis was performed using the standard protocol following bolus administration of intravenous contrast. RADIATION DOSE REDUCTION: This exam was performed according to the departmental dose-optimization program which includes automated exposure control, adjustment of the mA and/or kV according to patient size and/or use of iterative reconstruction technique. CONTRAST:  159m OMNIPAQUE IOHEXOL 300 MG/ML  SOLN COMPARISON:  CT done on 03/22/2022, radiographs done earlier today FINDINGS: Lower chest: Small linear densities are seen in both lower lung fields suggesting subsegmental atelectasis. Minimal bilateral pleural effusions are seen, more so on the left side. There is dense calcification in the margins of reconstruction/augmentation prostheses in both breasts. There are scattered coronary artery calcifications. Hepatobiliary: There is fatty infiltration in liver. There is previous cholecystectomy. Prominence of extrahepatic bile ducts may be due to previous cholecystectomy. Pancreas: No focal abnormalities are seen. Spleen: Unremarkable. Adrenals/Urinary Tract: Adrenals are unremarkable. There is no hydronephrosis. There are no renal or ureteral stones. There is 2.4 cm cyst in the lateral margin of the mid to lower portion of left kidney. There are other tiny subcentimeter low-density foci in both kidneys. Urinary bladder is not distended. Stomach/Bowel: Stomach is not distended. There is moderate dilation of proximal small bowel loops measuring up to 4.2 cm in diameter. Distal small bowel loops are not dilated. Zone transition appears to be in lower mid abdomen at the level of pelvic inlet. Appendix is not distinctly  seen. There is wall thickening in the caudal course of descending colon and in sigmoid colon suggesting inflammatory or  infectious colitis. Similar finding was seen in the previous study. There is mild wall thickening in rectum. There is interval appearance of few loculated fluid collections adjacent to sigmoid colon. There is 4.5 x 2.9 cm loculated collection immediately superior to the sigmoid colon lying adjacent to a small bowel loop which shows narrowing and wall thickening. There are few other small pockets of air and fluid adjacent to the anterior margin of sigmoid colon measuring up to 2 cm. There is a large loculated fluid collection measuring 9.8 x 5.6 cm in size in the posterior pelvic cavity posteroinferior to the sigmoid colon. There are no pockets of air in this fluid collection. Vascular/Lymphatic: Scattered arterial calcifications are seen. Reproductive: Uterus is difficult to visualize. There are no adnexal masses. Other: There is no pneumoperitoneum in the upper abdomen. There is minimal amount of ascites in upper abdomen. There are pockets of air in subcutaneous plane in right lower anterior abdominal wall, possibly sites of parenteral administration of medication. There is subcutaneous stranding, possibly suggesting anasarca. Musculoskeletal: Dextroscoliosis is seen in lumbar spine. Decrease in height of body of L2 vertebra has not changed significantly. Central compression in the body of L5 vertebra has not changed. IMPRESSION: There is abnormal diffuse wall thickening in distal descending colon and sigmoid colon consistent with inflammatory or infectious colitis with no significant interval change. There is interval appearance of few loculated fluid collections containing pockets of air adjacent to the sigmoid colon measuring up to 4.5 cm in diameter suggesting multiple pericolic abscesses. Largest of these abscesses is lying superior to the sigmoid colon immediately adjacent to a distal small bowel loop. There is mucosal thickening and narrowing of the lumen in this small bowel loop suggesting inflammatory or  infectious ileitis. Small bowel loops proximal to this inflamed ileum is dilated suggesting partial small bowel obstruction due to acute inflammatory/infectious ileitis. There is loculated fluid collection measuring 9.8 x 5.6 cm in size in lower pelvic cavity with mild wall thickening, possibly suggesting early abscess or seroma in the inferior pelvic cavity anterior to the rectum. There are no pockets of air within this loculated fluid collection. Interval appearance of minimal bilateral pleural effusions. There are linear densities in both lower lung fields suggesting subsegmental atelectasis. Other findings as described in the body of the report. Electronically Signed   By: Elmer Picker M.D.   On: 03/28/2022 16:42   DG Abd Portable 1V  Result Date: 03/28/2022 CLINICAL DATA:  Follow-up ileus. EXAM: PORTABLE ABDOMEN - 1 VIEW COMPARISON:  CT, 03/22/2022. Abdominal radiographs, 03/26/2022 and 03/25/2022. FINDINGS: Dilated central small bowel is without significant change from the most recent prior exam. Scattered air is seen within a normal caliber colon. No gross free air on this supine exam. IMPRESSION: 1. Persistent central abdominal small bowel dilation without colonic dilation. Findings may reflect a persistent small-bowel adynamic ileus or a partial obstruction. Electronically Signed   By: Lajean Manes M.D.   On: 03/28/2022 11:03    Assessment: *Colitis-complicated by multiple abscess formation *Enteritis/partial small bowel obstruction *Abdominal pain *Diarrhea  Plan: Repeat CT abdomen pelvis with evidence of diffuse wall thickening of the distal descending colon and sigmoid colon consistent with inflammatory infectious colitis.  This is not improved.  Also with new loculated fluid collections suggesting multiple pericolonic abscesses.  There is also new mucosal thickening and narrowing of the lumen of the small  bowel suggesting inflammatory infectious ileitis.  Partial small bowel  obstruction.  Also noted large 9.8 x 5.6 cm loculated fluid collection in the lower pelvic cavity possible early abscess versus seroma.  This is anterior to the rectum.  Surgery following, recommended continue IV antibiotics x3-4 days prior to potential ex lap.  Consider broadening antibiotic coverage.  Otherwise supportive care.  Elon Alas. Abbey Chatters, D.O. Gastroenterology and Hepatology [facility]   LOS: 5 days    03/29/2022, 11:48 AM

## 2022-03-29 NOTE — Progress Notes (Signed)
Progress Note    Joanna Sutton   ZOX:[id number]  DOB: 1955/06/13  DOA: 03/22/2022     5 PCP: Glenda Chroman, MD  Initial CC: Abdominal pain, nausea, vomiting, diarrhea  Hospital Course: Ms. Zale is a 66 yo female with PMH HTN, diverticulosis, PUD who presented with abdominal pain, nausea/vomiting/diarrhea.  She underwent CT abdomen/pelvis which showed mild colitis involving descending and sigmoid colon with large amount of stool noted.  Concern was for infectious etiology.  GI was consulted.  She also underwent stool testing with C. difficile and GI pathogen panel which were both negative.  She was continued on empiric antibiotics.  Interval History:  Patient was a little aggravated over her hospital course thus far.  Sat and talked with patient, her brother, and sister-in-law who was on the phone.  Reviewed findings from images again yesterday and tried clarifying further questions. Antibiotics will also be broadened today which she was okay with. Still having ongoing lower quadrant pain which feels about the same along with her nausea. Minimal bowel movement as usual which is mostly described as a paste.  Assessment and Plan:  Abdominal abscess  Colitis  - ongoing poor BM and ongoing distension with episode of vomiting yesterday with trial of further laxative and still just picking at food - repeat xray this morning shows dilated SB and no air in colon, raising concern for obstruction of some sort - last CT A/P on 12/17 mostly noting descending and sigmoid mild colitis with large stool burden -CT A/P repeated on 03/28/2022:       1) 4.5 x 2.9 cm loculated collection immediately superior to the sigmoid colon lying adjacent to a small bowel loop which shows narrowing and wall thickening       2) few other small pockets of air and fluid adjacent to the anterior margin of sigmoid colon measuring up to 2 cm            3)  large loculated fluid collection measuring 9.8 x 5.6 cm in size  in the posterior pelvic cavity posteroinferior to the sigmoid colon. There are no pockets of air in this fluid collection. - discussed with IR and general surgery; not all collections are amenable to IR drainage notably the 4.5 cm collection - appreciate general surgery following; plan to continue trending her clinically  - broaden abx on 12/24 given clinical worsening and also noted to have uptrending bandemia; allergies reviewed; start meropenem  Hypokalemia -Repleted   Essential hypertension: Acute renal antihypertensive medications continue to monitor vitals.   Hyperlipidemia: Continue statins.   History of peptic ulcer disease/dysphagia: Continue Protonix p.o. twice daily.   Depression: Continue Lexapro.   Old records reviewed in assessment of this patient  Antimicrobials: Rocephin 03/22/2022 >> 12/24 Flagyl 03/23/2022 >> 12/24 Meropenem 12/24 >> current   DVT prophylaxis:  enoxaparin (LOVENOX) injection 40 mg Start: 03/22/22 1700   Code Status:   Code Status: Full Code  Mobility Assessment (last 72 hours)     Mobility Assessment     Row Name 03/28/22 2341 03/27/22 0811 03/26/22 2346       Does patient have an order for bedrest or is patient medically unstable No - Continue assessment No - Continue assessment No - Continue assessment     What is the highest level of mobility based on the progressive mobility assessment? Level 4 (Walks with assist in room) - Balance while marching in place and cannot step forward and back - Complete Level 4 (Walks  with assist in room) - Balance while marching in place and cannot step forward and back - Complete Level 4 (Walks with assist in room) - Balance while marching in place and cannot step forward and back - Complete              Barriers to discharge:  Disposition Plan: pending clinical improvement  Status is: Inpatient  Objective: Blood pressure 117/69, pulse 81, temperature 98.4 F (36.9 C), temperature source  Oral, resp. rate 18, height '5\' 1"'$  (1.549 m), weight 62.8 kg, SpO2 97 %.  Examination:  Physical Exam Constitutional:      Appearance: She is well-developed.  HENT:     Head: Normocephalic and atraumatic.  Eyes:     Pupils: Pupils are equal, round, and reactive to light.  Cardiovascular:     Rate and Rhythm: Normal rate and regular rhythm.  Pulmonary:     Effort: Pulmonary effort is normal. No respiratory distress.     Breath sounds: Normal breath sounds. No wheezing.  Abdominal:     Comments: Ongoing mild distention with generalized abdominal pain/tenderness but worse in lower quadrants; no rebound or guarding.  Bowel sounds present in upper quadrants but becoming hypoactive now in lower quadrants  Musculoskeletal:        General: Normal range of motion.     Cervical back: Normal range of motion and neck supple.  Skin:    General: Skin is warm and dry.  Neurological:     General: No focal deficit present.     Mental Status: She is alert.  Psychiatric:        Mood and Affect: Mood normal.      Consultants:  GI  Procedures:    Data Reviewed: Results for orders placed or performed during the hospital encounter of 03/22/22 (from the past 24 hour(s))  CBC with Differential/Platelet     Status: Abnormal   Collection Time: 03/29/22  4:04 AM  Result Value Ref Range   WBC 10.1 4.0 - 10.5 K/uL   RBC 3.27 (L) 3.87 - 5.11 MIL/uL   Hemoglobin 10.3 (L) 12.0 - 15.0 g/dL   HCT 31.3 (L) 36.0 - 46.0 %   MCV 95.7 80.0 - 100.0 fL   MCH 31.5 26.0 - 34.0 pg   MCHC 32.9 30.0 - 36.0 g/dL   RDW 13.2 11.5 - 15.5 %   Platelets 405 (H) 150 - 400 K/uL   nRBC 0.3 (H) 0.0 - 0.2 %   Neutrophils Relative % 69 %   Neutro Abs 7.0 1.7 - 7.7 K/uL   Lymphocytes Relative 14 %   Lymphs Abs 1.5 0.7 - 4.0 K/uL   Monocytes Relative 11 %   Monocytes Absolute 1.1 (H) 0.1 - 1.0 K/uL   Eosinophils Relative 1 %   Eosinophils Absolute 0.1 0.0 - 0.5 K/uL   Basophils Relative 1 %   Basophils Absolute 0.1 0.0  - 0.1 K/uL   Immature Granulocytes 4 %   Abs Immature Granulocytes 0.40 (H) 0.00 - 0.07 K/uL  Magnesium     Status: None   Collection Time: 03/29/22  4:04 AM  Result Value Ref Range   Magnesium 2.4 1.7 - 2.4 mg/dL  Basic metabolic panel     Status: Abnormal   Collection Time: 03/29/22  4:04 AM  Result Value Ref Range   Sodium 137 135 - 145 mmol/L   Potassium 3.9 3.5 - 5.1 mmol/L   Chloride 103 98 - 111 mmol/L   CO2 27 22 -  32 mmol/L   Glucose, Bld 91 70 - 99 mg/dL   BUN <5 (L) 8 - 23 mg/dL   Creatinine, Ser 0.58 0.44 - 1.00 mg/dL   Calcium 8.2 (L) 8.9 - 10.3 mg/dL   GFR, Estimated >60 >60 mL/min   Anion gap 7 5 - 15    I have Reviewed nursing notes, Vitals, and Lab results since pt's last encounter. Pertinent lab results : see above I have ordered labwork to follow up on.  I have reviewed the last note from staff over past 24 hours I have discussed pt's care plan and test results with nursing staff, CM/SW, and other staff as appropriate  Time spent: Greater than 50% of the 55 minute visit was spent in counseling/coordination of care for the patient as laid out in the A&P.   LOS: 5 days   Dwyane Dee, MD [facility] 03/29/2022, 4:11 PM

## 2022-03-29 NOTE — Progress Notes (Signed)
Pt slept through the night, vitals stable. Pt reported 5/10 pain in abdomen, PRN tylenol given.

## 2022-03-29 NOTE — Consult Note (Signed)
Reason for Consult: Colitis, intra-abdominal abscess Referring Physician: Dr. Julianne Rice Joanna Sutton is an 66 y.o. female.  HPI: Patient is a 66 year old white female who was admitted to the hospital on 03/22/2022 with descending and sigmoid colon colitis.  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  She has been having multiple bowel movements but had generalized malaise.  She has lower abdominal pain which is intermittent in nature.  She has been on antibiotics since her admission.  A CT scan of the abdomen pelvis was performed which revealed a partial small bowel obstruction with ongoing inflammatory changes in the sigmoid colon and 2 fluid collections.  One fluid collection seems to be affecting the terminal ileum.  The other larger fluid collection appears to be seromatous fluid.  Patient is frustrated that she has not progressed since her admission.  Past Medical History:  Diagnosis Date   Depression    Diarrhea    GERD (gastroesophageal reflux disease)    High cholesterol    Hypertension     Past Surgical History:  Procedure Laterality Date   APPENDECTOMY     AUGMENTATION MAMMAPLASTY     BIOPSY  01/13/2022   Procedure: BIOPSY;  Surgeon: Harvel Quale, MD;  [facility];  Service: Gastroenterology;;   BREAST ENHANCEMENT SURGERY     CESAREAN SECTION     CHOLECYSTECTOMY     COLONOSCOPY WITH PROPOFOL N/A 01/13/2022   Procedure: COLONOSCOPY WITH PROPOFOL;  Surgeon: Harvel Quale, MD;  [facility];  Service: Gastroenterology;  Laterality: N/A;  1030 am ASA 2   ESOPHAGEAL DILATION N/A 09/27/2014   Procedure: ESOPHAGEAL DILATION;  Surgeon: Rogene Houston, MD;  [facility];  Service: Endoscopy;  Laterality: N/A;   ESOPHAGOGASTRODUODENOSCOPY N/A 09/27/2014   Procedure: ESOPHAGOGASTRODUODENOSCOPY (EGD);  Surgeon: Rogene Houston, MD;  [facility];   Service: Endoscopy;  Laterality: N/A;  930   ESOPHAGOGASTRODUODENOSCOPY N/A 01/03/2015   Procedure: ESOPHAGOGASTRODUODENOSCOPY (EGD);  Surgeon: Rogene Houston, MD;  [facility];  Service: Endoscopy;  Laterality: N/A;  300   ESOPHAGOGASTRODUODENOSCOPY (EGD) WITH PROPOFOL N/A 01/13/2022   Procedure: ESOPHAGOGASTRODUODENOSCOPY (EGD) WITH PROPOFOL;  Surgeon: Harvel Quale, MD;  [facility];  Service: Gastroenterology;  Laterality: N/A;   NASAL SINUS SURGERY     PATELLA FRACTURE SURGERY Left    around 2016   POLYPECTOMY  01/13/2022   Procedure: POLYPECTOMY;  Surgeon: Harvel Quale, MD;  [facility];  Service: Gastroenterology;;   SUBMUCOSAL LIFTING INJECTION  01/13/2022   Procedure: SUBMUCOSAL LIFTING INJECTION;  Surgeon: Harvel Quale, MD;  [facility];  Service: Gastroenterology;;   SUBMUCOSAL TATTOO INJECTION  01/13/2022   Procedure: SUBMUCOSAL TATTOO INJECTION;  Surgeon: Harvel Quale, MD;  [facility];  Service: Gastroenterology;;    Family History  Problem Relation Age of Onset   Leukemia Father    Cirrhosis Brother        non alcholic cirrhosis, fatty liver    Social History:  reports that she has never smoked. She has never been exposed to tobacco smoke. She has never used smokeless tobacco. She reports that she does not drink alcohol and does not use drugs.  Allergies:  Allergies  Allergen Reactions   Bactrim [Sulfamethoxazole-Trimethoprim] Hives   Levaquin [Levofloxacin] Nausea And Vomiting   Penicillins Rash    Medications: I have reviewed the  patient's current medications.  Results for orders placed or performed during the hospital encounter of 03/22/22 (from the past 48 hour(s))  CBC with Differential/Platelet     Status: Abnormal   Collection Time: 03/28/22  7:03 AM  Result Value Ref Range   WBC 9.3 4.0 - 10.5 K/uL   RBC 3.24 (L) 3.87 - 5.11 MIL/uL    Hemoglobin 10.1 (L) 12.0 - 15.0 g/dL   HCT 30.6 (L) 36.0 - 46.0 %   MCV 94.4 80.0 - 100.0 fL   MCH 31.2 26.0 - 34.0 pg   MCHC 33.0 30.0 - 36.0 g/dL   RDW 13.4 11.5 - 15.5 %   Platelets 335 150 - 400 K/uL   nRBC 0.0 0.0 - 0.2 %   Neutrophils Relative % 75 %   Neutro Abs 7.0 1.7 - 7.7 K/uL   Lymphocytes Relative 10 %   Lymphs Abs 0.9 0.7 - 4.0 K/uL   Monocytes Relative 11 %   Monocytes Absolute 1.1 (H) 0.1 - 1.0 K/uL   Eosinophils Relative 0 %   Eosinophils Absolute 0.0 0.0 - 0.5 K/uL   Basophils Relative 1 %   Basophils Absolute 0.1 0.0 - 0.1 K/uL   Immature Granulocytes 3 %   Abs Immature Granulocytes 0.24 (H) 0.00 - 0.07 K/uL    Comment: Performed at [facility], [address], [place], [place] [address]  Magnesium     Status: None   Collection Time: 03/28/22  7:03 AM  Result Value Ref Range   Magnesium 2.4 1.7 - 2.4 mg/dL    Comment: Performed at [facility], [address], [place], [place] [address]  Basic metabolic panel     Status: Abnormal   Collection Time: 03/28/22  7:03 AM  Result Value Ref Range   Sodium 138 135 - 145 mmol/L   Potassium 3.1 (L) 3.5 - 5.1 mmol/L   Chloride 101 98 - 111 mmol/L   CO2 28 22 - 32 mmol/L   Glucose, Bld 106 (H) 70 - 99 mg/dL    Comment: Glucose reference range applies only to samples taken after fasting for at least 8 hours.   BUN <5 (L) 8 - 23 mg/dL   Creatinine, Ser 0.57 0.44 - 1.00 mg/dL   Calcium 8.0 (L) 8.9 - 10.3 mg/dL   GFR, Estimated >60 >60 mL/min    Comment: (NOTE) Calculated using the CKD-EPI Creatinine Equation (2021)    Anion gap 9 5 - 15    Comment: Performed at [facility], [address] [place], [place] [address]  CBC with Differential/Platelet     Status: Abnormal   Collection Time: 03/29/22  4:04 AM  Result Value Ref Range   WBC 10.1 4.0 - 10.5 K/uL   RBC 3.27 (L) 3.87 - 5.11 MIL/uL   Hemoglobin 10.3 (L) 12.0 - 15.0 g/dL   HCT 31.3 (L) 36.0 - 46.0 %   MCV 95.7 80.0 - 100.0 fL   MCH 31.5 26.0 - 34.0 pg    MCHC 32.9 30.0 - 36.0 g/dL   RDW 13.2 11.5 - 15.5 %   Platelets 405 (H) 150 - 400 K/uL   nRBC 0.3 (H) 0.0 - 0.2 %   Neutrophils Relative % 69 %   Neutro Abs 7.0 1.7 - 7.7 K/uL   Lymphocytes Relative 14 %   Lymphs Abs 1.5 0.7 - 4.0 K/uL   Monocytes Relative 11 %   Monocytes Absolute 1.1 (H) 0.1 - 1.0 K/uL   Eosinophils Relative 1 %  Eosinophils Absolute 0.1 0.0 - 0.5 K/uL   Basophils Relative 1 %   Basophils Absolute 0.1 0.0 - 0.1 K/uL   Immature Granulocytes 4 %   Abs Immature Granulocytes 0.40 (H) 0.00 - 0.07 K/uL    Comment: Performed at [facility], [address] [place], [place] [address]  Magnesium     Status: None   Collection Time: 03/29/22  4:04 AM  Result Value Ref Range   Magnesium 2.4 1.7 - 2.4 mg/dL    Comment: Performed at [facility], [address] [place], [place] [address]  Basic metabolic panel     Status: Abnormal   Collection Time: 03/29/22  4:04 AM  Result Value Ref Range   Sodium 137 135 - 145 mmol/L   Potassium 3.9 3.5 - 5.1 mmol/L    Comment: DELTA CHECK NOTED   Chloride 103 98 - 111 mmol/L   CO2 27 22 - 32 mmol/L   Glucose, Bld 91 70 - 99 mg/dL    Comment: Glucose reference range applies only to samples taken after fasting for at least 8 hours.   BUN <5 (L) 8 - 23 mg/dL   Creatinine, Ser 0.58 0.44 - 1.00 mg/dL   Calcium 8.2 (L) 8.9 - 10.3 mg/dL   GFR, Estimated >60 >60 mL/min    Comment: (NOTE) Calculated using the CKD-EPI Creatinine Equation (2021)    Anion gap 7 5 - 15    Comment: Performed at [facility], [address] [place],  [address]    CT ABDOMEN PELVIS W CONTRAST  Result Date: 03/28/2022 CLINICAL DATA:  Abdominal pain, vomiting EXAM: CT ABDOMEN AND PELVIS WITH CONTRAST TECHNIQUE: Multidetector CT imaging of the abdomen and pelvis was performed using the standard protocol following bolus administration of intravenous contrast. RADIATION DOSE REDUCTION: This exam was performed according to the departmental dose-optimization  program which includes automated exposure control, adjustment of the mA and/or kV according to patient size and/or use of iterative reconstruction technique. CONTRAST:  157m OMNIPAQUE IOHEXOL 300 MG/ML  SOLN COMPARISON:  CT done on 03/22/2022, radiographs done earlier today FINDINGS: Lower chest: Small linear densities are seen in both lower lung fields suggesting subsegmental atelectasis. Minimal bilateral pleural effusions are seen, more so on the left side. There is dense calcification in the margins of reconstruction/augmentation prostheses in both breasts. There are scattered coronary artery calcifications. Hepatobiliary: There is fatty infiltration in liver. There is previous cholecystectomy. Prominence of extrahepatic bile ducts may be due to previous cholecystectomy. Pancreas: No focal abnormalities are seen. Spleen: Unremarkable. Adrenals/Urinary Tract: Adrenals are unremarkable. There is no hydronephrosis. There are no renal or ureteral stones. There is 2.4 cm cyst in the lateral margin of the mid to lower portion of left kidney. There are other tiny subcentimeter low-density foci in both kidneys. Urinary bladder is not distended. Stomach/Bowel: Stomach is not distended. There is moderate dilation of proximal small bowel loops measuring up to 4.2 cm in diameter. Distal small bowel loops are not dilated. Zone transition appears to be in lower mid abdomen at the level of pelvic inlet. Appendix is not distinctly seen. There is wall thickening in the caudal course of descending colon and in sigmoid colon suggesting inflammatory or infectious colitis. Similar finding was seen in the previous study. There is mild wall thickening in rectum. There is interval appearance of few loculated fluid collections adjacent to sigmoid colon. There is 4.5 x 2.9 cm loculated collection immediately superior to the sigmoid colon lying adjacent to a small bowel  loop which shows narrowing and wall thickening. There are few other  small pockets of air and fluid adjacent to the anterior margin of sigmoid colon measuring up to 2 cm. There is a large loculated fluid collection measuring 9.8 x 5.6 cm in size in the posterior pelvic cavity posteroinferior to the sigmoid colon. There are no pockets of air in this fluid collection. Vascular/Lymphatic: Scattered arterial calcifications are seen. Reproductive: Uterus is difficult to visualize. There are no adnexal masses. Other: There is no pneumoperitoneum in the upper abdomen. There is minimal amount of ascites in upper abdomen. There are pockets of air in subcutaneous plane in right lower anterior abdominal wall, possibly sites of parenteral administration of medication. There is subcutaneous stranding, possibly suggesting anasarca. Musculoskeletal: Dextroscoliosis is seen in lumbar spine. Decrease in height of body of L2 vertebra has not changed significantly. Central compression in the body of L5 vertebra has not changed. IMPRESSION: There is abnormal diffuse wall thickening in distal descending colon and sigmoid colon consistent with inflammatory or infectious colitis with no significant interval change. There is interval appearance of few loculated fluid collections containing pockets of air adjacent to the sigmoid colon measuring up to 4.5 cm in diameter suggesting multiple pericolic abscesses. Largest of these abscesses is lying superior to the sigmoid colon immediately adjacent to a distal small bowel loop. There is mucosal thickening and narrowing of the lumen in this small bowel loop suggesting inflammatory or infectious ileitis. Small bowel loops proximal to this inflamed ileum is dilated suggesting partial small bowel obstruction due to acute inflammatory/infectious ileitis. There is loculated fluid collection measuring 9.8 x 5.6 cm in size in lower pelvic cavity with mild wall thickening, possibly suggesting early abscess or seroma in the inferior pelvic cavity anterior to the rectum.  There are no pockets of air within this loculated fluid collection. Interval appearance of minimal bilateral pleural effusions. There are linear densities in both lower lung fields suggesting subsegmental atelectasis. Other findings as described in the body of the report. Electronically Signed   By: Elmer Picker M.D.   On: 03/28/2022 16:42   DG Abd Portable 1V  Result Date: 03/28/2022 CLINICAL DATA:  Follow-up ileus. EXAM: PORTABLE ABDOMEN - 1 VIEW COMPARISON:  CT, 03/22/2022. Abdominal radiographs, 03/26/2022 and 03/25/2022. FINDINGS: Dilated central small bowel is without significant change from the most recent prior exam. Scattered air is seen within a normal caliber colon. No gross free air on this supine exam. IMPRESSION: 1. Persistent central abdominal small bowel dilation without colonic dilation. Findings may reflect a persistent small-bowel adynamic ileus or a partial obstruction. Electronically Signed   By: Lajean Manes M.D.   On: 03/28/2022 11:03    ROS:  Pertinent items are noted in HPI.  Blood pressure 133/76, pulse 61, temperature 97.8 F (36.6 C), temperature source Oral, resp. rate 18, height '5\' 1"'$  (1.549 m), weight 62.8 kg, SpO2 94 %. Physical Exam: Pleasant white female no acute distress Head is normocephalic, atraumatic Lungs clear to auscultation with equal breath sounds bilaterally Heart examination reveals a regular rate and rhythm without S3, S4, murmurs Abdomen is soft but slightly distended but not tense.  She does have tenderness to deep palpation in the suprapubic and left lower quadrant regions.  CT scan images personally reviewed  Assessment/Plan: Impression: Colitis/diverticulitis of sigmoid colon with resultant partial small bowel obstruction.  There appears to be several fluid collections.  Interestingly, she has a normal white blood cell count. I sat down with both the patient  and her brother.  I told them that recovery from colitis can be a prolonged  process.  Should she require surgical exploration, she would most likely end up with a temporary colostomy.  I told them that I would like to wait for another 3 to 4 days to see whether she starts feeling better before performing surgery.  She does not meet the criteria for undergoing emergent exploration at the present time as her white blood cell count is within normal limits and she is only partially obstructed.  Contrast was seen throughout the colon and rectum.  I told them that there care has been very appropriate and sometimes colitis does not resolve as quickly and some patients.  I told them that surgery was a last resort.  They understand and agree.  Will follow with you.  All their questions were answered.  Aviva Signs 03/29/2022, 9:16 AM

## 2022-03-30 DIAGNOSIS — K529 Noninfective gastroenteritis and colitis, unspecified: Secondary | ICD-10-CM | POA: Diagnosis not present

## 2022-03-30 LAB — CBC WITH DIFFERENTIAL/PLATELET
Abs Immature Granulocytes: 0.36 10*3/uL — ABNORMAL HIGH (ref 0.00–0.07)
Basophils Absolute: 0.1 10*3/uL (ref 0.0–0.1)
Basophils Relative: 1 %
Eosinophils Absolute: 0.1 10*3/uL (ref 0.0–0.5)
Eosinophils Relative: 1 %
HCT: 32.5 % — ABNORMAL LOW (ref 36.0–46.0)
Hemoglobin: 10.7 g/dL — ABNORMAL LOW (ref 12.0–15.0)
Immature Granulocytes: 3 %
Lymphocytes Relative: 14 %
Lymphs Abs: 1.6 10*3/uL (ref 0.7–4.0)
MCH: 31.2 pg (ref 26.0–34.0)
MCHC: 32.9 g/dL (ref 30.0–36.0)
MCV: 94.8 fL (ref 80.0–100.0)
Monocytes Absolute: 1.2 10*3/uL — ABNORMAL HIGH (ref 0.1–1.0)
Monocytes Relative: 10 %
Neutro Abs: 8.1 10*3/uL — ABNORMAL HIGH (ref 1.7–7.7)
Neutrophils Relative %: 71 %
Platelets: 475 10*3/uL — ABNORMAL HIGH (ref 150–400)
RBC: 3.43 MIL/uL — ABNORMAL LOW (ref 3.87–5.11)
RDW: 13.6 % (ref 11.5–15.5)
WBC: 11.5 10*3/uL — ABNORMAL HIGH (ref 4.0–10.5)
nRBC: 0 % (ref 0.0–0.2)

## 2022-03-30 LAB — BASIC METABOLIC PANEL
Anion gap: 7 (ref 5–15)
BUN: <5 mg/dL — ABNORMAL LOW (ref 8–23)
CO2: 26 mmol/L (ref 22–32)
Calcium: 8.3 mg/dL — ABNORMAL LOW (ref 8.9–10.3)
Chloride: 106 mmol/L (ref 98–111)
Creatinine, Ser: 0.55 mg/dL (ref 0.44–1.00)
GFR, Estimated: >60 mL/min (ref >60)
Glucose, Bld: 92 mg/dL (ref 70–99)
Potassium: 3.8 mmol/L (ref 3.5–5.1)
Sodium: 139 mmol/L (ref 135–145)

## 2022-03-30 LAB — MAGNESIUM: Magnesium: 2.4 mg/dL (ref 1.7–2.4)

## 2022-03-30 NOTE — Progress Notes (Signed)
[facility] Progress Note     Subjective: Patient seen and examined.  She is resting comfortably in bed.  She was able to tolerate her diet without nausea and vomiting.  She states that her abdominal pain is a little bit more improved.  She continues to have loose bowel movements, though she believes that they are slowly starting to decrease in volume.  Objective: Vital signs in last 24 hours: Temp:  [98 F (36.7 C)-98.4 F (36.9 C)] 98 F (36.7 C) (12/25 0503) Pulse Rate:  [66-81] 66 (12/25 0503) Resp:  [18] 18 (12/25 0503) BP: (117-148)/(58-69) 135/58 (12/25 0503) SpO2:  [95 %-97 %] 95 % (12/25 0503) Last BM Date : 03/28/22  Intake/Output from previous day: 12/24 0701 - 12/25 0700 In: 1400 [P.O.:1200; IV Piggyback:200] Out: -  Intake/Output this shift: No intake/output data recorded.  General appearance: alert, cooperative, and no distress GI: Abdomen soft, nondistended, no percussion tenderness, tenderness to palpation in the left lower quadrant; no rigidity, guarding, rebound tenderness  Lab Results:  Recent Labs    03/29/22 0404 03/30/22 0501  WBC 10.1 11.5*  HGB 10.3* 10.7*  HCT 31.3* 32.5*  PLT 405* 475*   BMET Recent Labs    03/29/22 0404 03/30/22 0501  NA 137 139  K 3.9 3.8  CL 103 106  CO2 27 26  GLUCOSE 91 92  BUN <5* <5*  CREATININE 0.58 0.55  CALCIUM 8.2* 8.3*   PT/INR No results for input(s): "LABPROT", "INR" in the last 72 hours.  Studies/Results: CT ABDOMEN PELVIS W CONTRAST  Result Date: 03/28/2022 CLINICAL DATA:  Abdominal pain, vomiting EXAM: CT ABDOMEN AND PELVIS WITH CONTRAST TECHNIQUE: Multidetector CT imaging of the abdomen and pelvis was performed using the standard protocol following bolus administration of intravenous contrast. RADIATION DOSE REDUCTION: This exam was performed according to the departmental dose-optimization program which includes automated exposure control, adjustment of the mA and/or kV according  to patient size and/or use of iterative reconstruction technique. CONTRAST:  171m OMNIPAQUE IOHEXOL 300 MG/ML  SOLN COMPARISON:  CT done on 03/22/2022, radiographs done earlier today FINDINGS: Lower chest: Small linear densities are seen in both lower lung fields suggesting subsegmental atelectasis. Minimal bilateral pleural effusions are seen, more so on the left side. There is dense calcification in the margins of reconstruction/augmentation prostheses in both breasts. There are scattered coronary artery calcifications. Hepatobiliary: There is fatty infiltration in liver. There is previous cholecystectomy. Prominence of extrahepatic bile ducts may be due to previous cholecystectomy. Pancreas: No focal abnormalities are seen. Spleen: Unremarkable. Adrenals/Urinary Tract: Adrenals are unremarkable. There is no hydronephrosis. There are no renal or ureteral stones. There is 2.4 cm cyst in the lateral margin of the mid to lower portion of left kidney. There are other tiny subcentimeter low-density foci in both kidneys. Urinary bladder is not distended. Stomach/Bowel: Stomach is not distended. There is moderate dilation of proximal small bowel loops measuring up to 4.2 cm in diameter. Distal small bowel loops are not dilated. Zone transition appears to be in lower mid abdomen at the level of pelvic inlet. Appendix is not distinctly seen. There is wall thickening in the caudal course of descending colon and in sigmoid colon suggesting inflammatory or infectious colitis. Similar finding was seen in the previous study. There is mild wall thickening in rectum. There is interval appearance of few loculated fluid collections adjacent to sigmoid colon. There is 4.5 x 2.9 cm loculated collection immediately superior to the sigmoid colon lying adjacent to a  small bowel loop which shows narrowing and wall thickening. There are few other small pockets of air and fluid adjacent to the anterior margin of sigmoid colon measuring up  to 2 cm. There is a large loculated fluid collection measuring 9.8 x 5.6 cm in size in the posterior pelvic cavity posteroinferior to the sigmoid colon. There are no pockets of air in this fluid collection. Vascular/Lymphatic: Scattered arterial calcifications are seen. Reproductive: Uterus is difficult to visualize. There are no adnexal masses. Other: There is no pneumoperitoneum in the upper abdomen. There is minimal amount of ascites in upper abdomen. There are pockets of air in subcutaneous plane in right lower anterior abdominal wall, possibly sites of parenteral administration of medication. There is subcutaneous stranding, possibly suggesting anasarca. Musculoskeletal: Dextroscoliosis is seen in lumbar spine. Decrease in height of body of L2 vertebra has not changed significantly. Central compression in the body of L5 vertebra has not changed. IMPRESSION: There is abnormal diffuse wall thickening in distal descending colon and sigmoid colon consistent with inflammatory or infectious colitis with no significant interval change. There is interval appearance of few loculated fluid collections containing pockets of air adjacent to the sigmoid colon measuring up to 4.5 cm in diameter suggesting multiple pericolic abscesses. Largest of these abscesses is lying superior to the sigmoid colon immediately adjacent to a distal small bowel loop. There is mucosal thickening and narrowing of the lumen in this small bowel loop suggesting inflammatory or infectious ileitis. Small bowel loops proximal to this inflamed ileum is dilated suggesting partial small bowel obstruction due to acute inflammatory/infectious ileitis. There is loculated fluid collection measuring 9.8 x 5.6 cm in size in lower pelvic cavity with mild wall thickening, possibly suggesting early abscess or seroma in the inferior pelvic cavity anterior to the rectum. There are no pockets of air within this loculated fluid collection. Interval appearance of  minimal bilateral pleural effusions. There are linear densities in both lower lung fields suggesting subsegmental atelectasis. Other findings as described in the body of the report. Electronically Signed   By: Elmer Picker M.D.   On: 03/28/2022 16:42    Anti-infectives: Anti-infectives (From admission, onward)    Start     Dose/Rate Route Frequency Ordered Stop   03/29/22 1200  meropenem (MERREM) 1 g in sodium chloride 0.9 % 100 mL IVPB       See Hyperspace for full Linked Orders Report.   1 g 200 mL/hr over 30 Minutes Intravenous Every 8 hours 03/29/22 1114     03/29/22 1200  meropenem (MERREM) 1 g in sodium chloride 0.9 % 100 mL IVPB       See Hyperspace for full Linked Orders Report.   1 g 200 mL/hr over 30 Minutes Intravenous Every 8 hours 03/29/22 1114     03/29/22 1145  meropenem (MERREM) 2 g in sodium chloride 0.9 % 100 mL IVPB  Status:  Discontinued        2 g 280 mL/hr over 30 Minutes Intravenous Every 8 hours 03/29/22 1046 03/29/22 1115   03/23/22 1400  metroNIDAZOLE (FLAGYL) IVPB 500 mg  Status:  Discontinued        500 mg 100 mL/hr over 60 Minutes Intravenous Every 12 hours 03/23/22 1300 03/29/22 1013   03/23/22 1200  cefTRIAXone (ROCEPHIN) 1 g in sodium chloride 0.9 % 100 mL IVPB  Status:  Discontinued        1 g 200 mL/hr over 30 Minutes Intravenous Every 24 hours 03/22/22 1612 03/29/22 1013  03/22/22 1345  cefTRIAXone (ROCEPHIN) 1 g in sodium chloride 0.9 % 100 mL IVPB        1 g 200 mL/hr over 30 Minutes Intravenous  Once 03/22/22 1337 03/22/22 1503   03/22/22 0000  cefdinir (OMNICEF) 300 MG capsule        600 mg Oral Daily 03/22/22 1435 03/29/22 2359   03/22/22 0000  metroNIDAZOLE (FLAGYL) 500 MG tablet        500 mg Oral 3 times daily 03/22/22 1435 03/29/22 2359       Assessment/Plan:  Patient is a 66 year old female who was admitted with colitis/diverticulitis of the sigmoid colon, and began to develop partial small bowel obstruction with nearby fluid  collections on repeat CT imaging.  -Antibiotics changed yesterday to meropenem -Mild increase in leukocytosis today, 11.5 from 10.1 -Will continue to monitor -Continue with diet -PRN pain control and antiemetics -I reiterated to the patient that sometimes colitis can take several weeks to fully improve.  We also discussed that if she required surgery, she would have a temporary colostomy. -No indication for acute surgical intervention at this time, but will continue to monitor to see how patient progresses over the next day or 2 -Appreciate hospitalist and GI recommendations   LOS: 6 days    Joanna Sutton 03/30/2022

## 2022-03-30 NOTE — Progress Notes (Signed)
Progress Note    Joanna Sutton   [id number]:[id number]  DOB: 04/03/1956  DOA: 03/22/2022     6 PCP: Glenda Chroman, MD  Initial CC: Abdominal pain, nausea, vomiting, diarrhea  Hospital Course: Joanna Sutton is a 66 yo female with PMH HTN, diverticulosis, PUD who presented with abdominal pain, nausea/vomiting/diarrhea.  She underwent CT abdomen/pelvis which showed mild colitis involving descending and sigmoid colon with large amount of stool noted.  Concern was for infectious etiology.  GI was consulted.  She also underwent stool testing with C. difficile and GI pathogen panel which were both negative.  She was continued on empiric antibiotics.  Interval History:  Feeling the same, no worse at least. Few bites of food, mild nausea, mild pain, still "bloated". BM still kinda paste-like. No fevers.   Assessment and Plan:  Abdominal abscess  Colitis  - continues to have nausea, mild pain, bloating, and soft/minimal BM - last CT A/P on 12/17 mostly noting descending and sigmoid mild colitis with large stool burden -CT A/P repeated on 03/28/2022:       1) 4.5 x 2.9 cm loculated collection immediately superior to the sigmoid colon lying adjacent to a small bowel loop which shows narrowing and wall thickening       2) few other small pockets of air and fluid adjacent to the anterior margin of sigmoid colon measuring up to 2 cm            3)  large loculated fluid collection measuring 9.8 x 5.6 cm in size in the posterior pelvic cavity posteroinferior to the sigmoid colon. There are no pockets of air in this fluid collection. - discussed with IR and general surgery; not all collections are amenable to IR drainage notably the 4.5 cm collection - appreciate general surgery following; plan to continue trending her clinically  - I suspect slight increase in WBC is the bandemia maturing (bands are less today); she's not clinically worse today - continue meropenem  Hypokalemia -Repleted   Essential  hypertension: - BP stable without meds currently    Hyperlipidemia: Continue statin   History of peptic ulcer disease/dysphagia: Continue Protonix   Depression: Continue Lexapro   Old records reviewed in assessment of this patient  Antimicrobials: Rocephin 03/22/2022 >> 12/24 Flagyl 03/23/2022 >> 12/24 Meropenem 12/24 >> current   DVT prophylaxis:  enoxaparin (LOVENOX) injection 40 mg Start: 03/22/22 1700   Code Status:   Code Status: Full Code  Mobility Assessment (last 72 hours)     Mobility Assessment     Row Name 03/28/22 2341           Does patient have an order for bedrest or is patient medically unstable No - Continue assessment       What is the highest level of mobility based on the progressive mobility assessment? Level 4 (Walks with assist in room) - Balance while marching in place and cannot step forward and back - Complete                Barriers to discharge:  Disposition Plan: pending clinical improvement  Status is: Inpatient  Objective: Blood pressure (!) 135/58, pulse 66, temperature 98 F (36.7 C), resp. rate 18, height '5\' 1"'$  (1.549 m), weight 62.8 kg, SpO2 95 %.  Examination:  Physical Exam Constitutional:      Appearance: She is well-developed.  HENT:     Head: Normocephalic and atraumatic.  Eyes:     Pupils: Pupils are equal, round, and reactive  to light.  Cardiovascular:     Rate and Rhythm: Normal rate and regular rhythm.  Pulmonary:     Effort: Pulmonary effort is normal. No respiratory distress.     Breath sounds: Normal breath sounds. No wheezing.  Abdominal:     Comments: Ongoing mild distention with generalized abdominal pain/tenderness but worse in lower quadrants; no rebound or guarding.  Bowel sounds present in upper quadrants but becoming hypoactive now in lower quadrants  Musculoskeletal:        General: Normal range of motion.     Cervical back: Normal range of motion and neck supple.  Skin:    General: Skin is  warm and dry.  Neurological:     General: No focal deficit present.     Mental Status: She is alert.  Psychiatric:        Mood and Affect: Mood normal.      Consultants:  GI  Procedures:    Data Reviewed: Results for orders placed or performed during the hospital encounter of 03/22/22 (from the past 24 hour(s))  CBC with Differential/Platelet     Status: Abnormal   Collection Time: 03/30/22  5:01 AM  Result Value Ref Range   WBC 11.5 (H) 4.0 - 10.5 K/uL   RBC 3.43 (L) 3.87 - 5.11 MIL/uL   Hemoglobin 10.7 (L) 12.0 - 15.0 g/dL   HCT 32.5 (L) 36.0 - 46.0 %   MCV 94.8 80.0 - 100.0 fL   MCH 31.2 26.0 - 34.0 pg   MCHC 32.9 30.0 - 36.0 g/dL   RDW 13.6 11.5 - 15.5 %   Platelets 475 (H) 150 - 400 K/uL   nRBC 0.0 0.0 - 0.2 %   Neutrophils Relative % 71 %   Neutro Abs 8.1 (H) 1.7 - 7.7 K/uL   Lymphocytes Relative 14 %   Lymphs Abs 1.6 0.7 - 4.0 K/uL   Monocytes Relative 10 %   Monocytes Absolute 1.2 (H) 0.1 - 1.0 K/uL   Eosinophils Relative 1 %   Eosinophils Absolute 0.1 0.0 - 0.5 K/uL   Basophils Relative 1 %   Basophils Absolute 0.1 0.0 - 0.1 K/uL   Immature Granulocytes 3 %   Abs Immature Granulocytes 0.36 (H) 0.00 - 0.07 K/uL  Magnesium     Status: None   Collection Time: 03/30/22  5:01 AM  Result Value Ref Range   Magnesium 2.4 1.7 - 2.4 mg/dL  Basic metabolic panel     Status: Abnormal   Collection Time: 03/30/22  5:01 AM  Result Value Ref Range   Sodium 139 135 - 145 mmol/L   Potassium 3.8 3.5 - 5.1 mmol/L   Chloride 106 98 - 111 mmol/L   CO2 26 22 - 32 mmol/L   Glucose, Bld 92 70 - 99 mg/dL   BUN <5 (L) 8 - 23 mg/dL   Creatinine, Ser 0.55 0.44 - 1.00 mg/dL   Calcium 8.3 (L) 8.9 - 10.3 mg/dL   GFR, Estimated >60 >60 mL/min   Anion gap 7 5 - 15    I have Reviewed nursing notes, Vitals, and Lab results since pt's last encounter. Pertinent lab results : see above I have ordered labwork to follow up on.  I have reviewed the last note from staff over past 24  hours I have discussed pt's care plan and test results with nursing staff, CM/SW, and other staff as appropriate  Time spent: Greater than 50% of the 55 minute visit was spent in counseling/coordination of care  for the patient as laid out in the A&P.   LOS: 6 days   Dwyane Dee, MD [facility] 03/30/2022, 11:40 AM

## 2022-03-31 DIAGNOSIS — K529 Noninfective gastroenteritis and colitis, unspecified: Secondary | ICD-10-CM | POA: Diagnosis not present

## 2022-03-31 LAB — CBC WITH DIFFERENTIAL/PLATELET
Abs Immature Granulocytes: 0.31 10*3/uL — ABNORMAL HIGH (ref 0.00–0.07)
Basophils Absolute: 0.1 10*3/uL (ref 0.0–0.1)
Basophils Relative: 1 %
Eosinophils Absolute: 0.1 10*3/uL (ref 0.0–0.5)
Eosinophils Relative: 1 %
HCT: 33.8 % — ABNORMAL LOW (ref 36.0–46.0)
Hemoglobin: 10.8 g/dL — ABNORMAL LOW (ref 12.0–15.0)
Immature Granulocytes: 3 %
Lymphocytes Relative: 19 %
Lymphs Abs: 2.2 10*3/uL (ref 0.7–4.0)
MCH: 30.6 pg (ref 26.0–34.0)
MCHC: 32 g/dL (ref 30.0–36.0)
MCV: 95.8 fL (ref 80.0–100.0)
Monocytes Absolute: 1.1 10*3/uL — ABNORMAL HIGH (ref 0.1–1.0)
Monocytes Relative: 10 %
Neutro Abs: 7.5 10*3/uL (ref 1.7–7.7)
Neutrophils Relative %: 66 %
Platelets: 516 10*3/uL — ABNORMAL HIGH (ref 150–400)
RBC: 3.53 MIL/uL — ABNORMAL LOW (ref 3.87–5.11)
RDW: 13.6 % (ref 11.5–15.5)
WBC: 11.4 10*3/uL — ABNORMAL HIGH (ref 4.0–10.5)
nRBC: 0 % (ref 0.0–0.2)

## 2022-03-31 LAB — MAGNESIUM: Magnesium: 2.4 mg/dL (ref 1.7–2.4)

## 2022-03-31 LAB — BASIC METABOLIC PANEL
Anion gap: 7 (ref 5–15)
BUN: <5 mg/dL — ABNORMAL LOW (ref 8–23)
CO2: 26 mmol/L (ref 22–32)
Calcium: 8.2 mg/dL — ABNORMAL LOW (ref 8.9–10.3)
Chloride: 106 mmol/L (ref 98–111)
Creatinine, Ser: 0.57 mg/dL (ref 0.44–1.00)
GFR, Estimated: >60 mL/min (ref >60)
Glucose, Bld: 87 mg/dL (ref 70–99)
Potassium: 3.5 mmol/L (ref 3.5–5.1)
Sodium: 139 mmol/L (ref 135–145)

## 2022-03-31 NOTE — Progress Notes (Signed)
Progress Note    Joanna Sutton   WUJ:[id number]  DOB: 08/28/1955  DOA: 03/22/2022     7 PCP: Glenda Chroman, MD  Initial CC: Abdominal pain, nausea, vomiting, diarrhea  Hospital Course: Ms. Joanna Sutton is a 66 yo female with PMH HTN, diverticulosis, PUD who presented with abdominal pain, nausea/vomiting/diarrhea.  She underwent CT abdomen/pelvis which showed mild colitis involving descending and sigmoid colon with large amount of stool noted.  Concern was for infectious etiology.  GI was consulted.  She also underwent stool testing with C. difficile and GI pathogen panel which were both negative.  She was continued on empiric antibiotics.  Interval History:  No events overnight.  Still feels essentially the same.  Mild pain, mild nausea.  Still having pastelike stools but they do continue to pass easily.  No documented fevers and she denies any chills, sweats.  Assessment and Plan:  Abdominal abscess  Colitis  - continues to have nausea, mild pain, bloating, and soft/minimal BM - last CT A/P on 12/17 mostly noting descending and sigmoid mild colitis with large stool burden -CT A/P repeated on 03/28/2022:       1) 4.5 x 2.9 cm loculated collection immediately superior to the sigmoid colon lying adjacent to a small bowel loop which shows narrowing and wall thickening       2) few other small pockets of air and fluid adjacent to the anterior margin of sigmoid colon measuring up to 2 cm            3)  large loculated fluid collection measuring 9.8 x 5.6 cm in size in the posterior pelvic cavity posteroinferior to the sigmoid colon. There are no pockets of air in this fluid collection. - discussed with IR and general surgery; not all collections are amenable to IR drainage notably the 4.5 cm collection - appreciate general surgery following; plan to continue trending her clinically  - WBC stable; bands stable as well; continue meropenem and clinical monitoring with surgery  following  Hypokalemia -Repleted   Essential hypertension: - BP stable without meds currently    Hyperlipidemia: Continue statin   History of peptic ulcer disease/dysphagia: Continue Protonix   Depression: Continue Lexapro   Old records reviewed in assessment of this patient  Antimicrobials: Rocephin 03/22/2022 >> 12/24 Flagyl 03/23/2022 >> 12/24 Meropenem 12/24 >> current   DVT prophylaxis:  enoxaparin (LOVENOX) injection 40 mg Start: 03/22/22 1700   Code Status:   Code Status: Full Code  Mobility Assessment (last 72 hours)     Mobility Assessment     Row Name 03/30/22 1130 03/28/22 2341         Does patient have an order for bedrest or is patient medically unstable No - Continue assessment No - Continue assessment      What is the highest level of mobility based on the progressive mobility assessment? Level 6 (Walks independently in room and hall) - Balance while walking in room without assist - Complete Level 4 (Walks with assist in room) - Balance while marching in place and cannot step forward and back - Complete               Barriers to discharge:  Disposition Plan: pending clinical improvement  Status is: Inpatient  Objective: Blood pressure (!) 152/69, pulse 78, temperature 98.3 F (36.8 C), temperature source Oral, resp. rate 18, height '5\' 1"'$  (1.549 m), weight 62.8 kg, SpO2 97 %.  Examination:  Physical Exam Constitutional:  Appearance: She is well-developed.  HENT:     Head: Normocephalic and atraumatic.  Eyes:     Pupils: Pupils are equal, round, and reactive to light.  Cardiovascular:     Rate and Rhythm: Normal rate and regular rhythm.  Pulmonary:     Effort: Pulmonary effort is normal. No respiratory distress.     Breath sounds: Normal breath sounds. No wheezing.  Abdominal:     Comments: Ongoing mild distention with generalized abdominal pain/tenderness but worse in lower quadrants (but stable); no rebound or guarding.  Bowel  sounds remain hypoactive in lower quadrants  Musculoskeletal:        General: Normal range of motion.     Cervical back: Normal range of motion and neck supple.  Skin:    General: Skin is warm and dry.  Neurological:     General: No focal deficit present.     Mental Status: She is alert.  Psychiatric:        Mood and Affect: Mood normal.      Consultants:  GI  Procedures:    Data Reviewed: Results for orders placed or performed during the hospital encounter of 03/22/22 (from the past 24 hour(s))  Basic metabolic panel     Status: Abnormal   Collection Time: 03/31/22  3:49 AM  Result Value Ref Range   Sodium 139 135 - 145 mmol/L   Potassium 3.5 3.5 - 5.1 mmol/L   Chloride 106 98 - 111 mmol/L   CO2 26 22 - 32 mmol/L   Glucose, Bld 87 70 - 99 mg/dL   BUN <5 (L) 8 - 23 mg/dL   Creatinine, Ser 0.57 0.44 - 1.00 mg/dL   Calcium 8.2 (L) 8.9 - 10.3 mg/dL   GFR, Estimated >60 >60 mL/min   Anion gap 7 5 - 15  CBC with Differential/Platelet     Status: Abnormal   Collection Time: 03/31/22  3:49 AM  Result Value Ref Range   WBC 11.4 (H) 4.0 - 10.5 K/uL   RBC 3.53 (L) 3.87 - 5.11 MIL/uL   Hemoglobin 10.8 (L) 12.0 - 15.0 g/dL   HCT 33.8 (L) 36.0 - 46.0 %   MCV 95.8 80.0 - 100.0 fL   MCH 30.6 26.0 - 34.0 pg   MCHC 32.0 30.0 - 36.0 g/dL   RDW 13.6 11.5 - 15.5 %   Platelets 516 (H) 150 - 400 K/uL   nRBC 0.0 0.0 - 0.2 %   Neutrophils Relative % 66 %   Neutro Abs 7.5 1.7 - 7.7 K/uL   Lymphocytes Relative 19 %   Lymphs Abs 2.2 0.7 - 4.0 K/uL   Monocytes Relative 10 %   Monocytes Absolute 1.1 (H) 0.1 - 1.0 K/uL   Eosinophils Relative 1 %   Eosinophils Absolute 0.1 0.0 - 0.5 K/uL   Basophils Relative 1 %   Basophils Absolute 0.1 0.0 - 0.1 K/uL   Immature Granulocytes 3 %   Abs Immature Granulocytes 0.31 (H) 0.00 - 0.07 K/uL  Magnesium     Status: None   Collection Time: 03/31/22  3:49 AM  Result Value Ref Range   Magnesium 2.4 1.7 - 2.4 mg/dL    I have Reviewed nursing  notes, Vitals, and Lab results since pt's last encounter. Pertinent lab results : see above I have ordered labwork to follow up on.  I have reviewed the last note from staff over past 24 hours I have discussed pt's care plan and test results with nursing staff, CM/SW,  and other staff as appropriate  Time spent: Greater than 50% of the 55 minute visit was spent in counseling/coordination of care for the patient as laid out in the A&P.   LOS: 7 days   Dwyane Dee, MD [facility] 03/31/2022, 1:19 PM

## 2022-03-31 NOTE — Progress Notes (Signed)
[facility] Progress Note     Subjective: Patient seen and examined.  She is resting comfortably in bed.  She continues to tolerate her diet without nausea or vomiting.  She confirms small improvement in her abdominal pain.  She also feels that she has had less bowel movements.  Objective: Vital signs in last 24 hours: Temp:  [97.8 F (36.6 C)-99 F (37.2 C)] 98.3 F (36.8 C) (12/26 0920) Pulse Rate:  [63-78] 78 (12/26 0920) Resp:  [14-20] 18 (12/26 0920) BP: (137-152)/(58-72) 152/69 (12/26 0920) SpO2:  [96 %-97 %] 97 % (12/26 0920) Last BM Date : 03/30/22  Intake/Output from previous day: 12/25 0701 - 12/26 0700 In: 720 [P.O.:720] Out: -  Intake/Output this shift: No intake/output data recorded.  General appearance: alert, cooperative, and no distress GI: Abdomen soft, nondistended, no percussion tenderness, minimal left lower quadrant tenderness to palpation; no rigidity, guarding, rebound tenderness  Lab Results:  Recent Labs    03/30/22 0501 03/31/22 0349  WBC 11.5* 11.4*  HGB 10.7* 10.8*  HCT 32.5* 33.8*  PLT 475* 516*   BMET Recent Labs    03/30/22 0501 03/31/22 0349  NA 139 139  K 3.8 3.5  CL 106 106  CO2 26 26  GLUCOSE 92 87  BUN <5* <5*  CREATININE 0.55 0.57  CALCIUM 8.3* 8.2*   PT/INR No results for input(s): "LABPROT", "INR" in the last 72 hours.  Studies/Results: No results found.  Anti-infectives: Anti-infectives (From admission, onward)    Start     Dose/Rate Route Frequency Ordered Stop   03/29/22 1200  meropenem (MERREM) 1 g in sodium chloride 0.9 % 100 mL IVPB       See Hyperspace for full Linked Orders Report.   1 g 200 mL/hr over 30 Minutes Intravenous Every 8 hours 03/29/22 1114     03/29/22 1200  meropenem (MERREM) 1 g in sodium chloride 0.9 % 100 mL IVPB       See Hyperspace for full Linked Orders Report.   1 g 200 mL/hr over 30 Minutes Intravenous Every 8 hours 03/29/22 1114     03/29/22 1145  meropenem  (MERREM) 2 g in sodium chloride 0.9 % 100 mL IVPB  Status:  Discontinued        2 g 280 mL/hr over 30 Minutes Intravenous Every 8 hours 03/29/22 1046 03/29/22 1115   03/23/22 1400  metroNIDAZOLE (FLAGYL) IVPB 500 mg  Status:  Discontinued        500 mg 100 mL/hr over 60 Minutes Intravenous Every 12 hours 03/23/22 1300 03/29/22 1013   03/23/22 1200  cefTRIAXone (ROCEPHIN) 1 g in sodium chloride 0.9 % 100 mL IVPB  Status:  Discontinued        1 g 200 mL/hr over 30 Minutes Intravenous Every 24 hours 03/22/22 1612 03/29/22 1013   03/22/22 1345  cefTRIAXone (ROCEPHIN) 1 g in sodium chloride 0.9 % 100 mL IVPB        1 g 200 mL/hr over 30 Minutes Intravenous  Once 03/22/22 1337 03/22/22 1503   03/22/22 0000  cefdinir (OMNICEF) 300 MG capsule        600 mg Oral Daily 03/22/22 1435 03/29/22 2359   03/22/22 0000  metroNIDAZOLE (FLAGYL) 500 MG tablet        500 mg Oral 3 times daily 03/22/22 1435 03/29/22 2359       Assessment/Plan:  Patient is a 68 old female who was admitted with colitis/diverticulitis of the sigmoid colon, began to  develop partial small bowel obstruction with nearby fluid collections on repeat CT imaging.  -Patient feels slightly improved this morning -Leukocytosis stable today, 11.4 from 11.5 -Continue with meropenem -Okay to continue diet -PRN pain control and antiemetics -I discussed with the patient, her brother, and her sister-in-law that surgery is not emergently indicated at this time, and decision for surgery will be made based on how the patient is feeling and whether she feels her pain is improving.  I reiterated again to the patient that if she needs surgery, she will need a temporary colostomy.  I also discussed that we will consider repeat CT of the abdomen and pelvis in a few days to reevaluate these fluid collections -No acute surgical intervention at this time -Appreciate hospitalist recommendations   LOS: 7 days    [place] 03/31/2022

## 2022-03-31 NOTE — Progress Notes (Signed)
Joanna Sutton, M.D. Gastroenterology & Hepatology   Interval History:  No acute events overnight. Patient reports that her abdomen is less tender than in the past.  Denies any nausea or vomiting, states that she had 2 loose bowel movements yesterday.  No melena or hematochezia.  Inpatient Medications:  Current Facility-Administered Medications:    acetaminophen (TYLENOL) tablet 650 mg, 650 mg, Oral, Q4H PRN, Dwyane Dee, MD, 650 mg at 03/30/22 2145   atorvastatin (LIPITOR) tablet 40 mg, 40 mg, Oral, Daily, Cruzita Lederer, Costin M, MD, 40 mg at 03/31/22 2440   cyanocobalamin (VITAMIN B12) tablet 2,000 mcg, 2,000 mcg, Oral, Daily, Cruzita Lederer, Costin M, MD, 2,000 mcg at 03/31/22 0922   enoxaparin (LOVENOX) injection 40 mg, 40 mg, Subcutaneous, Q24H, Gherghe, Costin M, MD, 40 mg at 03/30/22 1729   escitalopram (LEXAPRO) tablet 10 mg, 10 mg, Oral, Daily, Cruzita Lederer, Costin M, MD, 10 mg at 03/31/22 1027   loratadine (CLARITIN) tablet 10 mg, 10 mg, Oral, Daily, Cruzita Lederer, Costin M, MD, 10 mg at 03/31/22 2536   meropenem (MERREM) 1 g in sodium chloride 0.9 % 100 mL IVPB, 1 g, Intravenous, Q8H, Last Rate: 200 mL/hr at 03/31/22 0712, 1 g at 03/31/22 0712 **AND** meropenem (MERREM) 1 g in sodium chloride 0.9 % 100 mL IVPB, 1 g, Intravenous, Q8H, Girguis, Shanon Brow, MD, Last Rate: 200 mL/hr at 03/31/22 0630, 1 g at 03/31/22 0630   ondansetron (ZOFRAN) tablet 4 mg, 4 mg, Oral, Q6H PRN, 4 mg at 03/22/22 1657 **OR** ondansetron (ZOFRAN) injection 4 mg, 4 mg, Intravenous, Q6H PRN, Cruzita Lederer, Costin M, MD, 4 mg at 03/28/22 1239   pantoprazole (PROTONIX) EC tablet 40 mg, 40 mg, Oral, Daily, Cruzita Lederer, Costin M, MD, 40 mg at 03/31/22 0922   polyethylene glycol (MIRALAX / GLYCOLAX) packet 17 g, 17 g, Oral, TID, Mahon, Courtney L, NP, 17 g at 03/30/22 2146   I/O    Intake/Output Summary (Last 24 hours) at 03/31/2022 1205 Last data filed at 03/30/2022 1830 Gross per 24 hour  Intake 240 ml  Output --  Net 240 ml      Physical Exam: Temp:  [97.8 F (36.6 C)-99 F (37.2 C)] 98.3 F (36.8 C) (12/26 0920) Pulse Rate:  [63-78] 78 (12/26 0920) Resp:  [14-20] 18 (12/26 0920) BP: (137-152)/(58-72) 152/69 (12/26 0920) SpO2:  [96 %-97 %] 97 % (12/26 0920)  Temp (24hrs), Avg:98.4 F (36.9 C), Min:97.8 F (36.6 C), Max:99 F (37.2 C) GENERAL: The patient is AO x3, in no acute distress. HEENT: Head is normocephalic and atraumatic. EOMI are intact. Mouth is well hydrated and without lesions. NECK: Supple. No masses LUNGS: Clear to auscultation. No presence of rhonchi/wheezing/rales. Adequate chest expansion HEART: RRR, normal s1 and s2. ABDOMEN: Soft, nontender, no guarding, no peritoneal signs, and nondistended. BS +. No masses. EXTREMITIES: Without any cyanosis, clubbing, rash, lesions or edema. NEUROLOGIC: AOx3, no focal motor deficit. SKIN: no jaundice, no rashes  Laboratory Data: CBC:     Component Value Date/Time   WBC 11.4 (H) 03/31/2022 0349   RBC 3.53 (L) 03/31/2022 0349   HGB 10.8 (L) 03/31/2022 0349   HCT 33.8 (L) 03/31/2022 0349   PLT 516 (H) 03/31/2022 0349   MCV 95.8 03/31/2022 0349   MCH 30.6 03/31/2022 0349   MCHC 32.0 03/31/2022 0349   RDW 13.6 03/31/2022 0349   LYMPHSABS 2.2 03/31/2022 0349   MONOABS 1.1 (H) 03/31/2022 0349   EOSABS 0.1 03/31/2022 0349   BASOSABS 0.1 03/31/2022 0349   COAG:  Lab Results  Component Value Date   INR 1.3 (H) 03/22/2022    BMP:     Latest Ref Rng & Units 03/31/2022    3:49 AM 03/30/2022    5:01 AM 03/29/2022    4:04 AM  BMP  Glucose 70 - 99 mg/dL 87  92  91   BUN 8 - 23 mg/dL <5  <5  <5   Creatinine 0.44 - 1.00 mg/dL 0.57  0.55  0.58   Sodium 135 - 145 mmol/L 139  139  137   Potassium 3.5 - 5.1 mmol/L 3.5  3.8  3.9   Chloride 98 - 111 mmol/L 106  106  103   CO2 22 - 32 mmol/L _0 Calcium 8.9 - 10.3 mg/dL 8.2  8.3  8.2     HEPATIC:     Latest Ref Rng & Units 03/23/2022    7:49 AM 03/22/2022   11:10 AM  Hepatic  Function  Total Protein 6.5 - 8.1 g/dL 5.4  7.1   Albumin 3.5 - 5.0 g/dL 2.8  4.0   AST 15 - 41 U/L 15  17   ALT 0 - 44 U/L 13  14   Alk Phosphatase 38 - 126 U/L 33  47   Total Bilirubin 0.3 - 1.2 mg/dL 0.7  0.5     CARDIAC: No results found for: "CKTOTAL", "CKMB", "CKMBINDEX", "TROPONINI"    Imaging: I personally reviewed and interpreted the available labs, imaging and endoscopic files.   Assessment/Plan: Briefly, this is a 66 year old female with past medical history of depression, hypercholesterolemia, HTN, esophagitis, who came to the hospital after presenting worsening abdominal pain, nausea and diarrhea.  She was found to have changes on imaging concerning for colitis.  C. difficile and GI pathogen panel were negative and she was initially treated with standard antibiotic regimen.  However, given persistent abdominal pain, repeat CT scan was performed and showed a loculated fluid collection in the lower pelvic area concerning for an abscess.  General surgery was consulted and recommended continue with antibiotics, currently broadened to meropenem management.  She has presented some clinical improvement but still has not complete normalized.  Hopefully the antibiotic regimen will make significant improvement of her ongoing infection, although there are plans for possible surgical intervention if repeat imaging does not show major resolution of her inflammatory changes.  -Continue with GI soft diet -Continue with antibiotic coverage with meropenem -Appreciate surgical input -As needed antiemetics and pain control  Joanna Peppers, MD Gastroenterology and [facility]

## 2022-04-01 ENCOUNTER — Inpatient Hospital Stay (HOSPITAL_COMMUNITY): Payer: PPO

## 2022-04-01 DIAGNOSIS — K529 Noninfective gastroenteritis and colitis, unspecified: Secondary | ICD-10-CM | POA: Diagnosis not present

## 2022-04-01 LAB — LACTATE DEHYDROGENASE: LDH: 204 U/L — ABNORMAL HIGH (ref 98–192)

## 2022-04-01 LAB — CBC WITH DIFFERENTIAL/PLATELET
Abs Immature Granulocytes: 0.28 10*3/uL — ABNORMAL HIGH (ref 0.00–0.07)
Basophils Absolute: 0.1 10*3/uL (ref 0.0–0.1)
Basophils Relative: 1 %
Eosinophils Absolute: 0.1 10*3/uL (ref 0.0–0.5)
Eosinophils Relative: 1 %
HCT: 34.5 % — ABNORMAL LOW (ref 36.0–46.0)
Hemoglobin: 11.4 g/dL — ABNORMAL LOW (ref 12.0–15.0)
Immature Granulocytes: 2 %
Lymphocytes Relative: 15 %
Lymphs Abs: 2 10*3/uL (ref 0.7–4.0)
MCH: 31.4 pg (ref 26.0–34.0)
MCHC: 33 g/dL (ref 30.0–36.0)
MCV: 95 fL (ref 80.0–100.0)
Monocytes Absolute: 1 10*3/uL (ref 0.1–1.0)
Monocytes Relative: 7 %
Neutro Abs: 9.8 10*3/uL — ABNORMAL HIGH (ref 1.7–7.7)
Neutrophils Relative %: 74 %
Platelets: 538 10*3/uL — ABNORMAL HIGH (ref 150–400)
RBC: 3.63 MIL/uL — ABNORMAL LOW (ref 3.87–5.11)
RDW: 13.6 % (ref 11.5–15.5)
WBC: 13.2 10*3/uL — ABNORMAL HIGH (ref 4.0–10.5)
nRBC: 0 % (ref 0.0–0.2)

## 2022-04-01 LAB — BASIC METABOLIC PANEL
Anion gap: 8 (ref 5–15)
BUN: <5 mg/dL — ABNORMAL LOW (ref 8–23)
CO2: 26 mmol/L (ref 22–32)
Calcium: 8.5 mg/dL — ABNORMAL LOW (ref 8.9–10.3)
Chloride: 105 mmol/L (ref 98–111)
Creatinine, Ser: 0.56 mg/dL (ref 0.44–1.00)
GFR, Estimated: >60 mL/min (ref >60)
Glucose, Bld: 95 mg/dL (ref 70–99)
Potassium: 3.5 mmol/L (ref 3.5–5.1)
Sodium: 139 mmol/L (ref 135–145)

## 2022-04-01 LAB — MAGNESIUM: Magnesium: 2.4 mg/dL (ref 1.7–2.4)

## 2022-04-01 MED ORDER — HYDRALAZINE HCL 20 MG/ML IJ SOLN: 10.0000 mg | Freq: Four times a day (QID) | INTRAMUSCULAR | Status: DC | PRN

## 2022-04-01 NOTE — Progress Notes (Signed)
Physical Therapy Treatment Patient Details Name: Joanna Sutton MRN: [id number] DOB: [birth year] Today's Date: 04/01/2022   History of Present Illness Joanna Sutton is a 66 y.o. female with medical history significant of HTN, HLD, depression, GERD, comes to the hospital with complaints of abdominal pain, nausea, diarrhea for the past 2 days.  She is not sure, but feels that it may have been related to eating out the day prior to admission.  She went to a [place] where she had Shrimp fajita, and tells me she has gotten sick from that place before but not always.  She has been followed by gastroenterology for rectal bleeding and dysphagia, recently in the past October she had a colonoscopy with several polyps removed and diverticulosis, and and upper endoscopy with a dilated nonobstructive Schatzki ring as well as nonbleeding gastric ulcers with a clean ulcer base.  Currently reports abdominal pain, throughout, with nausea and poor p.o. intake.  She denies any fever or chills.  She denies any blood in the stools and tells me that her stools are just pure liquid.  She denies any vomiting.  No recent medication changes or antibiotics    PT Comments    Patient demonstrates good return for bed mobility, transferring to/from commode in bathroom, completing exercises seated at bedside and no loss of balance ambulating in room/hallway without requiring need for an AD.  Patient encouraged to ambulate with nursing staff and family members supervising for length of stay.  Plan:  Patient discharged from physical therapy to care of nursing for ambulation daily as tolerated for length of stay.    Recommendations for follow up therapy are one component of a multi-disciplinary discharge planning process, led by the attending physician.  Recommendations may be updated based on patient status, additional functional criteria and insurance authorization.  Follow Up Recommendations  Home health PT      Assistance Recommended at Discharge Set up Supervision/Assistance  Patient can return home with the following A little help with bathing/dressing/bathroom;A little help with walking and/or transfers;Help with stairs or ramp for entrance;Assistance with cooking/housework   Equipment Recommendations  None recommended by PT    Recommendations for Other Services       Precautions / Restrictions Precautions Precautions: None Restrictions Weight Bearing Restrictions: No     Mobility  Bed Mobility Overal bed mobility: Modified Independent             General bed mobility comments: good return for sitting up at bedside with HOB slightly raised    Transfers Overall transfer level: Modified independent                 General transfer comment: good return transferring to/from commode in bathroom    Ambulation/Gait Ambulation/Gait assistance: Supervision Gait Distance (Feet): 100 Feet Assistive device: None Gait Pattern/deviations: Decreased step length - right, Decreased step length - left, Decreased stride length Gait velocity: decreased     General Gait Details: slightly labored cadence requiring verbal cues to let arms swing with fair/good carryover, no loss of balance and limited mostly due to c/o mild dizziness   Stairs             Wheelchair Mobility    Modified Rankin (Stroke Patients Only)       Balance Overall balance assessment: Needs assistance Sitting-balance support: Feet supported, No upper extremity supported Sitting balance-Leahy Scale: Good Sitting balance - Comments: seated at EOB   Standing balance support: During functional activity, No upper extremity supported  Standing balance-Leahy Scale: Good Standing balance comment: without AD                            Cognition Arousal/Alertness: Awake/alert Behavior During Therapy: WFL for tasks assessed/performed Overall Cognitive Status: Within Functional Limits for  tasks assessed                                          Exercises General Exercises - Lower Extremity Long Arc Quad: Seated, AROM, Strengthening, Both, 10 reps Hip Flexion/Marching: Seated, AROM, Strengthening, Both, 10 reps Toe Raises: Seated, AROM, Strengthening, 10 reps, Both Heel Raises: Seated, AROM, Strengthening, Both, 10 reps    General Comments        Pertinent Vitals/Pain Pain Assessment Pain Assessment: No/denies pain    Home Living                          Prior Function            PT Goals (current goals can now be found in the care plan section) Acute Rehab PT Goals Patient Stated Goal: return home PT Goal Formulation: With patient/family Time For Goal Achievement: 04/01/22 Potential to Achieve Goals: Good Progress towards PT goals: Goals met/education completed, patient discharged from PT    Frequency           PT Plan Other (comment);Current plan remains appropriate (Patient discharged to nursing for ambulation daily)    Co-evaluation              AM-PAC PT "6 Clicks" Mobility   Outcome Measure  Help needed turning from your back to your side while in a flat bed without using bedrails?: None Help needed moving from lying on your back to sitting on the side of a flat bed without using bedrails?: None Help needed moving to and from a bed to a chair (including a wheelchair)?: None Help needed standing up from a chair using your arms (e.g., wheelchair or bedside chair)?: None Help needed to walk in hospital room?: A Little Help needed climbing 3-5 steps with a railing? : A Little 6 Click Score: 22    End of Session   Activity Tolerance: Patient tolerated treatment well;Patient limited by fatigue Patient left: in bed;with call bell/phone within reach;with family/visitor present Nurse Communication: Mobility status PT Visit Diagnosis: Other abnormalities of gait and mobility (R26.89);Muscle weakness (generalized)  (M62.81);Pain     Time: 1038-1100 PT Time Calculation (min) (ACUTE ONLY): 22 min  Charges:  $Gait Training: 8-22 mins $Therapeutic Exercise: 8-22 mins                     2:23 PM, 04/01/22 Lonell Grandchild, MPT Physical Therapist with [facility] [phone] office [phone] mobile phone

## 2022-04-01 NOTE — Progress Notes (Signed)
Subjective: Patient does feel better today but still has some lower abdominal discomfort.  Her bowel movements are slowing up.  She is tolerating a regular diet well, though her appetite is decreased.  Objective: Vital signs in last 24 hours: Temp:  [98.3 F (36.8 C)-99.5 F (37.5 C)] 98.3 F (36.8 C) (12/27 0821) Pulse Rate:  [69-81] 80 (12/27 0821) Resp:  [16-18] 18 (12/27 0821) BP: (128-160)/(57-74) 128/57 (12/27 0821) SpO2:  [96 %-98 %] 96 % (12/27 0821) Last BM Date : 03/31/22  Intake/Output from previous day: 12/26 0701 - 12/27 0700 In: 480 [P.O.:480] Out: -  Intake/Output this shift: No intake/output data recorded.  General appearance: alert, cooperative, and no distress GI: Soft with minimal tenderness in the left lower quadrant region.  No rigidity is noted.  Bowel sounds are present.  Lab Results:  Recent Labs    03/31/22 0349 04/01/22 0341  WBC 11.4* 13.2*  HGB 10.8* 11.4*  HCT 33.8* 34.5*  PLT 516* 538*   BMET Recent Labs    03/31/22 0349 04/01/22 0341  NA 139 139  K 3.5 3.5  CL 106 105  CO2 26 26  GLUCOSE 87 95  BUN <5* <5*  CREATININE 0.57 0.56  CALCIUM 8.2* 8.5*   PT/INR No results for input(s): "LABPROT", "INR" in the last 72 hours.  Studies/Results: No results found.  Anti-infectives: Anti-infectives (From admission, onward)    Start     Dose/Rate Route Frequency Ordered Stop   03/29/22 1200  meropenem (MERREM) 1 g in sodium chloride 0.9 % 100 mL IVPB       See Hyperspace for full Linked Orders Report.   1 g 200 mL/hr over 30 Minutes Intravenous Every 8 hours 03/29/22 1114     03/29/22 1200  meropenem (MERREM) 1 g in sodium chloride 0.9 % 100 mL IVPB  Status:  Discontinued       See Hyperspace for full Linked Orders Report.   1 g 200 mL/hr over 30 Minutes Intravenous Every 8 hours 03/29/22 1114 03/31/22 1328   03/29/22 1145  meropenem (MERREM) 2 g in sodium chloride 0.9 % 100 mL IVPB  Status:  Discontinued        2 g 280 mL/hr  over 30 Minutes Intravenous Every 8 hours 03/29/22 1046 03/29/22 1115   03/23/22 1400  metroNIDAZOLE (FLAGYL) IVPB 500 mg  Status:  Discontinued        500 mg 100 mL/hr over 60 Minutes Intravenous Every 12 hours 03/23/22 1300 03/29/22 1013   03/23/22 1200  cefTRIAXone (ROCEPHIN) 1 g in sodium chloride 0.9 % 100 mL IVPB  Status:  Discontinued        1 g 200 mL/hr over 30 Minutes Intravenous Every 24 hours 03/22/22 1612 03/29/22 1013   03/22/22 1345  cefTRIAXone (ROCEPHIN) 1 g in sodium chloride 0.9 % 100 mL IVPB        1 g 200 mL/hr over 30 Minutes Intravenous  Once 03/22/22 1337 03/22/22 1503   03/22/22 0000  cefdinir (OMNICEF) 300 MG capsule        600 mg Oral Daily 03/22/22 1435 03/29/22 2359   03/22/22 0000  metroNIDAZOLE (FLAGYL) 500 MG tablet        500 mg Oral 3 times daily 03/22/22 1435 03/29/22 2359       Assessment/Plan: Impression: Overall, the patient is better.  She has has slight bump in her leukocytosis and platelet count.  I have requested IR drainage of the pelvic fluid as I would  be concerned about secondary infection.  Patient does not need surgical intervention at the present time.  This was fully explained to her.  Once the drain is placed, we should be able to send the patient home and I will follow the patient as an outpatient.  Discussed with Dr. Denton Brick.  LOS: 8 days    Aviva Signs 04/01/2022

## 2022-04-01 NOTE — Progress Notes (Signed)
  Subjective: Patient continues with some lower abdominal pain. 3 pasty like stools this am without blood or melena. She denies nausea or vomiting. Feels appetite is still not great.   Objective: Vital signs in last 24 hours: Temp:  [98.3 F (36.8 C)-99.5 F (37.5 C)] 98.3 F (36.8 C) (12/27 0821) Pulse Rate:  [69-81] 80 (12/27 0821) Resp:  [16-18] 18 (12/27 0821) BP: (128-160)/(57-74) 128/57 (12/27 0821) SpO2:  [96 %-98 %] 96 % (12/27 0821) Last BM Date : 03/31/22 General:   Alert and oriented, pleasant Head:  Normocephalic and atraumatic. Eyes:  No icterus, sclera clear. Conjuctiva pink.  Mouth:  Without lesions, mucosa pink and moist.   Heart:  S1, S2 present, no murmurs noted.  Lungs: Clear to auscultation bilaterally, without wheezing, rales, or rhonchi.  Abdomen:  Bowel sounds present, soft, non-distended. Mild generalized tenderness to palpation of the abdomen, worse in the RLQ. No HSM or hernias noted. No rebound or guarding. No masses appreciated  Msk:  Symmetrical without gross deformities. Normal posture. Pulses:  Normal pulses noted. Extremities:  Without clubbing or edema. Neurologic:  Alert and  oriented x4;  grossly normal neurologically. Skin:  Warm and dry, intact without significant lesions.  Psych:  Alert and cooperative. Normal mood and affect.  Intake/Output from previous day: 12/26 0701 - 12/27 0700 In: 480 [P.O.:480] Out: -  Intake/Output this shift: No intake/output data recorded.  Lab Results: Recent Labs    03/30/22 0501 03/31/22 0349 04/01/22 0341  WBC 11.5* 11.4* 13.2*  HGB 10.7* 10.8* 11.4*  HCT 32.5* 33.8* 34.5*  PLT 475* 516* 538*   BMET Recent Labs    03/30/22 0501 03/31/22 0349 04/01/22 0341  NA 139 139 139  K 3.8 3.5 3.5  CL 106 106 105  CO2 '26 26 26  '$ GLUCOSE 92 87 95  BUN <5* <5* <5*  CREATININE 0.55 0.57 0.56  CALCIUM 8.3* 8.2* 8.5*    Assessment: Briefly, Joanna Sutton is a  66 year old female with past medical  history of depression, hypercholesterolemia, HTN, esophagitis, who came to the hospital after presenting worsening abdominal pain, nausea and diarrhea.    Patient found to have changes on imaging concerning for colitis.  C. difficile and GI pathogen panel were negative, initially treated with standard antibiotic regimen.  However, given persistent abdominal pain, repeat CT scan was performed on 12/23 and showed a loculated fluid collection in the lower pelvic area concerning for an abscess.  She has presented with some clinical improvement but continues with some mild abdominal pain, having more pasty like stools. WBC trending up, 13.2 today. General surgery has been on board, recommended continue with antibiotics and has requested IR drainage of pelvic abscess at [facility] hospital.    Plan: Continue with GI soft diet Continue with antibiotic coverage Appreciate surgery input/recommendations  PRN anti emetics and pain management per hospitalist    LOS: 8 days    04/01/2022, 9:45 AM  Dailey Buccheri L. Alver Sorrow, MSN, APRN, AGNP-C Adult-Gerontology Nurse Practitioner [facility]ology at [facility]

## 2022-04-01 NOTE — Plan of Care (Addendum)
Request to IR for aspiration/drainage of pelvic fluid collection. Patient history and imaging reviewed by Dr. Dwaine Gale who approves left transgluteal drain placement to be done 12/28 at [facility] IR.   Plan: - Patient will need to arrive to [facility] IR department via [facility] at 10:00 am and will return to [facility] post procedure via [facility]. Appreciate bedside RN setting up [facility]. - NPO at midnight - CBC/INR AM of 12/28 - Hold order placed for 1700 dose of Lovenox today, may resume anticoagulation post procedure - Full consult/consent when arrives at [facility]  Please call with questions or concerns.  Candiss Norse, PA-C

## 2022-04-01 NOTE — Progress Notes (Addendum)
Progress Note    Joanna Sutton   PYP:[id number]  DOB: Aug 25, 1955  DOA: 03/22/2022     8 PCP: Glenda Chroman, MD  Initial CC: Abdominal pain, nausea, vomiting, diarrhea  Hospital Course: Ms. Pellman is a 66 yo female with PMH HTN, diverticulosis, PUD who presented with abdominal pain, nausea/vomiting/diarrhea.  She underwent CT abdomen/pelvis which showed mild colitis involving descending and sigmoid colon with large amount of stool noted.  Concern was for infectious etiology.  GI was consulted.  She also underwent stool testing with C. difficile and GI pathogen panel which were both negative.  She was continued on empiric antibiotics.  Interval History:  -Oral intake is fair -No fevers or chills  no nausea vomiting -No increased abdominal discomfort  Assessment and Plan:  1)Abdominal Abscess /Colitis  - continues to have nausea, mild pain, bloating, and soft/minimal BM - last CT A/P on 12/17 mostly noting descending and sigmoid mild colitis with large stool burden -CT A/P repeated on 03/28/2022:       1) 4.5 x 2.9 cm loculated collection immediately superior to the sigmoid colon lying adjacent to a small bowel loop which shows narrowing and wall thickening       2) few other small pockets of air and fluid adjacent to the anterior margin of sigmoid colon measuring up to 2 cm            3)  large loculated fluid collection measuring 9.8 x 5.6 cm in size in the posterior pelvic cavity posteroinferior to the sigmoid colon. -Treated with Rocephin/Flagyl from 03/22/22 through 03/29/22 - transitioned to meropenem on 03/29/22 -WBC trending up (11.4 >>13.2) -General surgery consult appreciated -IR consult for left transgluteal drain placemen at [facility] on 04/02/2022-with fluid studies  2)Hypokalemia -Repleted   3)Essential hypertension: - BP stable without meds currently  -Hold PTA lisinopril -Use IV hydralazine as needed elevated BP   4)Hyperlipidemia: Continue  atorvastatin  5)Chronic normocytic anemia--Hgb stable -Watch for bleeding  6)History of peptic ulcer disease/dysphagia: Continue Protonix   7)Depression: Continue Lexapro  -On 04/01/2022 -at patient's request updated patient's brother and sister-in-law by phone, questions answered  Antimicrobials: Rocephin 03/22/2022 >> 12/24 Flagyl 03/23/2022 >> 12/24 Meropenem 12/24 >> current   DVT prophylaxis:  enoxaparin (LOVENOX) injection 40 mg Start: 03/22/22 1700   Code Status:   Code Status: Full Code  Mobility Assessment (last 72 hours)     Mobility Assessment     Row Name 04/01/22 1419 03/31/22 0922 03/30/22 1130       Does patient have an order for bedrest or is patient medically unstable -- No - Continue assessment No - Continue assessment     What is the highest level of mobility based on the progressive mobility assessment? Level 5 (Walks with assist in room/hall) - Balance while stepping forward/back and can walk in room with assist - Complete Level 6 (Walks independently in room and hall) - Balance while walking in room without assist - Complete Level 6 (Walks independently in room and hall) - Balance while walking in room without assist - Complete              Barriers to discharge:  Disposition Plan: pending clinical improvement  Status is: Inpatient  Objective: Blood pressure (!) 128/57, pulse 80, temperature 98.3 F (36.8 C), temperature source Oral, resp. rate 18, height '5\' 1"'$  (1.549 m), weight 62.8 kg, SpO2 96 %.    Physical Exam  Gen:- Awake Alert, in no acute distress  HEENT:-  [place].AT, No sclera icterus Neck-Supple Neck,No JVD,.  Lungs-  CTAB , fair air movement bilaterally  CV- S1, S2 normal, RRR Abd-  +ve B.Sounds, Abd Soft, generalized abdominal tenderness especially in the lower left quadrant, no rebound Extremity/Skin:- No  edema,   good pedal pulses  Psych-affect is appropriate, oriented x3 Neuro-no new focal deficits, no tremors  Consultants:   GI  Procedures:  left transgluteal drain placemen at [facility] 04/02/22  Data Reviewed: Results for orders placed or performed during the hospital encounter of 03/22/22 (from the past 24 hour(s))  CBC with Differential/Platelet     Status: Abnormal   Collection Time: 04/01/22  3:41 AM  Result Value Ref Range   WBC 13.2 (H) 4.0 - 10.5 K/uL   RBC 3.63 (L) 3.87 - 5.11 MIL/uL   Hemoglobin 11.4 (L) 12.0 - 15.0 g/dL   HCT 34.5 (L) 36.0 - 46.0 %   MCV 95.0 80.0 - 100.0 fL   MCH 31.4 26.0 - 34.0 pg   MCHC 33.0 30.0 - 36.0 g/dL   RDW 13.6 11.5 - 15.5 %   Platelets 538 (H) 150 - 400 K/uL   nRBC 0.0 0.0 - 0.2 %   Neutrophils Relative % 74 %   Neutro Abs 9.8 (H) 1.7 - 7.7 K/uL   Lymphocytes Relative 15 %   Lymphs Abs 2.0 0.7 - 4.0 K/uL   Monocytes Relative 7 %   Monocytes Absolute 1.0 0.1 - 1.0 K/uL   Eosinophils Relative 1 %   Eosinophils Absolute 0.1 0.0 - 0.5 K/uL   Basophils Relative 1 %   Basophils Absolute 0.1 0.0 - 0.1 K/uL   Immature Granulocytes 2 %   Abs Immature Granulocytes 0.28 (H) 0.00 - 0.07 K/uL  Magnesium     Status: None   Collection Time: 04/01/22  3:41 AM  Result Value Ref Range   Magnesium 2.4 1.7 - 2.4 mg/dL  Basic metabolic panel     Status: Abnormal   Collection Time: 04/01/22  3:41 AM  Result Value Ref Range   Sodium 139 135 - 145 mmol/L   Potassium 3.5 3.5 - 5.1 mmol/L   Chloride 105 98 - 111 mmol/L   CO2 26 22 - 32 mmol/L   Glucose, Bld 95 70 - 99 mg/dL   BUN <5 (L) 8 - 23 mg/dL   Creatinine, Ser 0.56 0.44 - 1.00 mg/dL   Calcium 8.5 (L) 8.9 - 10.3 mg/dL   GFR, Estimated >60 >60 mL/min   Anion gap 8 5 - 15  Lactate dehydrogenase     Status: Abnormal   Collection Time: 04/01/22  7:58 AM  Result Value Ref Range   LDH 204 (H) 98 - 192 U/L    3 LOS: 8 days   Roxan Hockey, MD [facility] 04/01/2022, 2:29 PM

## 2022-04-02 ENCOUNTER — Ambulatory Visit (HOSPITAL_COMMUNITY)
Admit: 2022-04-02 | Discharge: 2022-04-02 | Disposition: A | Discharge status: Home / Self Care | Payer: PPO | Attending: Family Medicine | Admitting: Family Medicine

## 2022-04-02 ENCOUNTER — Encounter (HOSPITAL_COMMUNITY): Payer: Self-pay

## 2022-04-02 ENCOUNTER — Telehealth: Payer: Self-pay | Admitting: Gastroenterology

## 2022-04-02 DIAGNOSIS — K651 Peritoneal abscess: Secondary | ICD-10-CM | POA: Diagnosis not present

## 2022-04-02 DIAGNOSIS — F32A Depression, unspecified: Secondary | ICD-10-CM | POA: Insufficient documentation

## 2022-04-02 DIAGNOSIS — K219 Gastro-esophageal reflux disease without esophagitis: Secondary | ICD-10-CM | POA: Insufficient documentation

## 2022-04-02 DIAGNOSIS — K529 Noninfective gastroenteritis and colitis, unspecified: Secondary | ICD-10-CM | POA: Diagnosis not present

## 2022-04-02 DIAGNOSIS — R188 Other ascites: Secondary | ICD-10-CM | POA: Insufficient documentation

## 2022-04-02 DIAGNOSIS — I10 Essential (primary) hypertension: Secondary | ICD-10-CM | POA: Insufficient documentation

## 2022-04-02 LAB — MAGNESIUM: Magnesium: 2.4 mg/dL (ref 1.7–2.4)

## 2022-04-02 LAB — CBC WITH DIFFERENTIAL/PLATELET
Abs Immature Granulocytes: 0.21 10*3/uL — ABNORMAL HIGH (ref 0.00–0.07)
Basophils Absolute: 0.1 10*3/uL (ref 0.0–0.1)
Basophils Relative: 1 %
Eosinophils Absolute: 0.1 10*3/uL (ref 0.0–0.5)
Eosinophils Relative: 1 %
HCT: 34.7 % — ABNORMAL LOW (ref 36.0–46.0)
Hemoglobin: 11.3 g/dL — ABNORMAL LOW (ref 12.0–15.0)
Immature Granulocytes: 1 %
Lymphocytes Relative: 14 %
Lymphs Abs: 2.2 10*3/uL (ref 0.7–4.0)
MCH: 31 pg (ref 26.0–34.0)
MCHC: 32.6 g/dL (ref 30.0–36.0)
MCV: 95.1 fL (ref 80.0–100.0)
Monocytes Absolute: 1.2 10*3/uL — ABNORMAL HIGH (ref 0.1–1.0)
Monocytes Relative: 8 %
Neutro Abs: 12.1 10*3/uL — ABNORMAL HIGH (ref 1.7–7.7)
Neutrophils Relative %: 75 %
Platelets: 540 10*3/uL — ABNORMAL HIGH (ref 150–400)
RBC: 3.65 MIL/uL — ABNORMAL LOW (ref 3.87–5.11)
RDW: 13.6 % (ref 11.5–15.5)
WBC: 16 10*3/uL — ABNORMAL HIGH (ref 4.0–10.5)
nRBC: 0 % (ref 0.0–0.2)

## 2022-04-02 LAB — BASIC METABOLIC PANEL
Anion gap: 7 (ref 5–15)
BUN: <5 mg/dL — ABNORMAL LOW (ref 8–23)
CO2: 26 mmol/L (ref 22–32)
Calcium: 8.5 mg/dL — ABNORMAL LOW (ref 8.9–10.3)
Chloride: 105 mmol/L (ref 98–111)
Creatinine, Ser: 0.52 mg/dL (ref 0.44–1.00)
GFR, Estimated: >60 mL/min (ref >60)
Glucose, Bld: 96 mg/dL (ref 70–99)
Potassium: 3.5 mmol/L (ref 3.5–5.1)
Sodium: 138 mmol/L (ref 135–145)

## 2022-04-02 LAB — PROTIME-INR
INR: 1.2 (ref 0.8–1.2)
Prothrombin Time: 15 seconds (ref 11.4–15.2)

## 2022-04-02 MED ORDER — FENTANYL CITRATE (PF) 100 MCG/2ML IJ SOLN
INTRAMUSCULAR | Status: AC
  Filled 2022-04-02: qty 2

## 2022-04-02 MED ORDER — LIDOCAINE HCL (PF) 1 % IJ SOLN
10.0000 mL | Freq: Once | INTRAMUSCULAR | Status: AC
  Administered 2022-04-02: 10 mL

## 2022-04-02 MED ORDER — MIDAZOLAM HCL 2 MG/2ML IJ SOLN
INTRAMUSCULAR | Status: DC | PRN
  Administered 2022-04-02 (×2): 1 mg via INTRAVENOUS

## 2022-04-02 MED ORDER — MORPHINE SULFATE (PF) 2 MG/ML IV SOLN
2.0000 mg | Freq: Once | INTRAVENOUS | Status: AC
  Administered 2022-04-02: 2 mg via INTRAVENOUS
  Filled 2022-04-02: qty 1

## 2022-04-02 MED ORDER — FENTANYL CITRATE (PF) 100 MCG/2ML IJ SOLN
INTRAMUSCULAR | Status: DC | PRN
  Administered 2022-04-02 (×2): 50 ug via INTRAVENOUS

## 2022-04-02 MED ORDER — ENOXAPARIN SODIUM 40 MG/0.4ML IJ SOSY
40.0000 mg | PREFILLED_SYRINGE | INTRAMUSCULAR | Status: DC
  Administered 2022-04-02 – 2022-04-03 (×2): 40 mg via SUBCUTANEOUS
  Filled 2022-04-02 (×2): qty 0.4

## 2022-04-02 MED ORDER — MIDAZOLAM HCL 2 MG/2ML IJ SOLN
INTRAMUSCULAR | Status: AC
  Filled 2022-04-02: qty 2

## 2022-04-02 NOTE — Consult Note (Signed)
Chief Complaint: Patient was seen in consultation today for intra-abdominal fluid collection  Referring Physician(s): Emokpae,Courage  Supervising Physician: Jacqulynn Cadet  Patient Status: [facility] - In-pt  History of Present Illness: Joanna Sutton is a 66 y.o. female with past medical history GERD, HTN, depression who presented to [facility] with abdominal pain, diarrhea 03/22/22. She was found to have colitis/diverticulitis of the sigmoid colon.  She has developed worsening leukocytosis with ongoing lower abdominal pain.    IR consulted for aspiration and drainage of intra-abdominal fluid collections based on CT 03/28/22.  Case reviewed and approved by Dr. Dwaine Gale.    She presents to [facility] today from [facility].  She is understanding of the goals of the procedure as it was discussed with her as well as family over the phone.  She has been NPO. She is agreeable to proceed.   Past Medical History:  Diagnosis Date   Depression    Diarrhea    GERD (gastroesophageal reflux disease)    High cholesterol    Hypertension     Past Surgical History:  Procedure Laterality Date   APPENDECTOMY     AUGMENTATION MAMMAPLASTY     BIOPSY  01/13/2022   Procedure: BIOPSY;  Surgeon: Harvel Quale, MD;  [facility];  Service: Gastroenterology;;   BREAST ENHANCEMENT SURGERY     CESAREAN SECTION     CHOLECYSTECTOMY     COLONOSCOPY WITH PROPOFOL N/A 01/13/2022   Procedure: COLONOSCOPY WITH PROPOFOL;  Surgeon: Harvel Quale, MD;  [facility];  Service: Gastroenterology;  Laterality: N/A;  1030 am ASA 2   ESOPHAGEAL DILATION N/A 09/27/2014   Procedure: ESOPHAGEAL DILATION;  Surgeon: Rogene Houston, MD;  Location: [facility];  Service: Endoscopy;  Laterality: N/A;   ESOPHAGOGASTRODUODENOSCOPY N/A 09/27/2014   Procedure: ESOPHAGOGASTRODUODENOSCOPY (EGD);  Surgeon: Rogene Houston, MD;  [facility];  Service: Endoscopy;  Laterality: N/A;  930    ESOPHAGOGASTRODUODENOSCOPY N/A 01/03/2015   Procedure: ESOPHAGOGASTRODUODENOSCOPY (EGD);  Surgeon: Rogene Houston, MD;  [facility];  Service: Endoscopy;  Laterality: N/A;  300   ESOPHAGOGASTRODUODENOSCOPY (EGD) WITH PROPOFOL N/A 01/13/2022   Procedure: ESOPHAGOGASTRODUODENOSCOPY (EGD) WITH PROPOFOL;  Surgeon: Harvel Quale, MD;  [facility];  Service: Gastroenterology;  Laterality: N/A;   NASAL SINUS SURGERY     PATELLA FRACTURE SURGERY Left    around 2016   POLYPECTOMY  01/13/2022   Procedure: POLYPECTOMY;  Surgeon: Harvel Quale, MD;  [facility];  Service: Gastroenterology;;   SUBMUCOSAL LIFTING INJECTION  01/13/2022   Procedure: SUBMUCOSAL LIFTING INJECTION;  Surgeon: Harvel Quale, MD;  [facility];  Service: Gastroenterology;;   SUBMUCOSAL TATTOO INJECTION  01/13/2022   Procedure: SUBMUCOSAL TATTOO INJECTION;  Surgeon: Harvel Quale, MD;  Location[facility];  Service: Gastroenterology;;    Allergies: Bactrim [sulfamethoxazole-trimethoprim], Levaquin [levofloxacin], and Penicillins  Medications: Prior to Admission medications   Medication Sig Start Date End Date Taking? Authorizing Provider  acetaminophen (TYLENOL) 500 MG tablet Take 500 mg by mouth every 6 (six) hours as needed for pain.    [provider]  atorvastatin (LIPITOR) 40 MG tablet Take 40 mg by mouth daily.    [provider]  augmented betamethasone dipropionate (DIPROLENE-AF) 0.05 % cream Apply 1 Application topically 2 (two) times daily.    [provider]  benzonatate (TESSALON) 200 MG capsule Take 200 mg by mouth 3 (three) times daily as needed. 02/24/22  [provider]  cetirizine (ZYRTEC) 10 MG tablet Take 10 mg by mouth daily.    [provider]  cyanocobalamin (VITAMIN B12) 1000 MCG tablet Take 2,000 mcg by mouth daily.    [provider]  escitalopram  (LEXAPRO) 10 MG tablet Take 10 mg by mouth daily.    [provider]  lisinopril (ZESTRIL) 10 MG tablet Take 10-20 mg by mouth daily.    [provider]  LORazepam (ATIVAN) 0.5 MG tablet Take 0.5 mg by mouth at bedtime as needed for sleep.    [provider]  pantoprazole (PROTONIX) 40 MG tablet Take 1 tablet (40 mg total) by mouth 2 (two) times daily before a meal. Patient taking differently: Take 40 mg by mouth daily. 09/27/14   Rehman, Mechele Dawley, MD  polyethylene glycol (MIRALAX) 17 g packet Take 17 g by mouth daily. 03/22/22   Sherrye Payor A, PA-C  potassium chloride (KLOR-CON) 10 MEQ tablet Take 10 mEq by mouth daily as needed. 11/24/21   [provider]  tretinoin (RETIN-A) 0.025 % cream Apply 1 Application topically at bedtime.    [provider]     Family History  Problem Relation Age of Onset   Leukemia Father    Cirrhosis Brother        non alcholic cirrhosis, fatty liver    Social History   Socioeconomic History   Marital status: Divorced    Spouse name: Not on file   Number of children: Not on file   Years of education: Not on file   Highest education level: Not on file  Occupational History   Not on file  Tobacco Use   Smoking status: Never    Passive exposure: Never   Smokeless tobacco: Never  Vaping Use   Vaping Use: Never used  Substance and Sexual Activity   Alcohol use: No   Drug use: No   Sexual activity: Never  Other Topics Concern   Not on file  Social History Narrative   Not on file   Social Determinants of Health   Financial Resource Strain: Not on file  Food Insecurity: No Food Insecurity (03/23/2022)   Hunger Vital Sign    Worried About Running Out of Food in the Last Year: Never true    Ran Out of Food in the Last Year: Never true  Transportation Needs: No Transportation Needs (03/23/2022)   PRAPARE - Hydrologist (Medical): No    Lack of Transportation  (Non-Medical): No  Physical Activity: Not on file  Stress: Not on file  Social Connections: Not on file     Review of Systems: A 12 point ROS discussed and pertinent positives are indicated in the HPI above.  All other systems are negative.  Review of Systems  Constitutional:  Negative for fatigue and fever.  Respiratory:  Negative for cough and shortness of breath.   Cardiovascular:  Negative for chest pain.  Gastrointestinal:  Positive for abdominal pain, diarrhea and nausea. Negative for vomiting.  Psychiatric/Behavioral:  Negative for behavioral problems and confusion.     Vital Signs: There were no vitals taken for this visit.  Physical Exam Vitals and nursing note reviewed.  Constitutional:      General: She is not in acute distress.    Appearance: Normal appearance. She is not ill-appearing.  HENT:     Mouth/Throat:     Mouth: Mucous membranes are moist.     Pharynx: Oropharynx is clear.  Cardiovascular:  Rate and Rhythm: Normal rate and regular rhythm.  Pulmonary:     Effort: Pulmonary effort is normal.     Breath sounds: Normal breath sounds.  Abdominal:     General: Abdomen is flat. There is no distension.     Palpations: Abdomen is soft.     Tenderness: There is abdominal tenderness.  Skin:    General: Skin is warm and dry.  Neurological:     General: No focal deficit present.     Mental Status: She is alert and oriented to person, place, and time. Mental status is at baseline.  Psychiatric:        Mood and Affect: Mood normal.        Behavior: Behavior normal.        Thought Content: Thought content normal.        Judgment: Judgment normal.      MD Evaluation Airway: WNL Heart: WNL Abdomen: WNL Chest/ Lungs: WNL ASA  Classification: 3 Mallampati/Airway Score: Two   Imaging: CT ABDOMEN PELVIS W CONTRAST  Result Date: 03/28/2022 CLINICAL DATA:  Abdominal pain, vomiting EXAM: CT ABDOMEN AND PELVIS WITH CONTRAST TECHNIQUE: Multidetector CT  imaging of the abdomen and pelvis was performed using the standard protocol following bolus administration of intravenous contrast. RADIATION DOSE REDUCTION: This exam was performed according to the departmental dose-optimization program which includes automated exposure control, adjustment of the mA and/or kV according to patient size and/or use of iterative reconstruction technique. CONTRAST:  159m OMNIPAQUE IOHEXOL 300 MG/ML  SOLN COMPARISON:  CT done on 03/22/2022, radiographs done earlier today FINDINGS: Lower chest: Small linear densities are seen in both lower lung fields suggesting subsegmental atelectasis. Minimal bilateral pleural effusions are seen, more so on the left side. There is dense calcification in the margins of reconstruction/augmentation prostheses in both breasts. There are scattered coronary artery calcifications. Hepatobiliary: There is fatty infiltration in liver. There is previous cholecystectomy. Prominence of extrahepatic bile ducts may be due to previous cholecystectomy. Pancreas: No focal abnormalities are seen. Spleen: Unremarkable. Adrenals/Urinary Tract: Adrenals are unremarkable. There is no hydronephrosis. There are no renal or ureteral stones. There is 2.4 cm cyst in the lateral margin of the mid to lower portion of left kidney. There are other tiny subcentimeter low-density foci in both kidneys. Urinary bladder is not distended. Stomach/Bowel: Stomach is not distended. There is moderate dilation of proximal small bowel loops measuring up to 4.2 cm in diameter. Distal small bowel loops are not dilated. Zone transition appears to be in lower mid abdomen at the level of pelvic inlet. Appendix is not distinctly seen. There is wall thickening in the caudal course of descending colon and in sigmoid colon suggesting inflammatory or infectious colitis. Similar finding was seen in the previous study. There is mild wall thickening in rectum. There is interval appearance of few loculated  fluid collections adjacent to sigmoid colon. There is 4.5 x 2.9 cm loculated collection immediately superior to the sigmoid colon lying adjacent to a small bowel loop which shows narrowing and wall thickening. There are few other small pockets of air and fluid adjacent to the anterior margin of sigmoid colon measuring up to 2 cm. There is a large loculated fluid collection measuring 9.8 x 5.6 cm in size in the posterior pelvic cavity posteroinferior to the sigmoid colon. There are no pockets of air in this fluid collection. Vascular/Lymphatic: Scattered arterial calcifications are seen. Reproductive: Uterus is difficult to visualize. There are no adnexal masses. Other: There is no pneumoperitoneum  in the upper abdomen. There is minimal amount of ascites in upper abdomen. There are pockets of air in subcutaneous plane in right lower anterior abdominal wall, possibly sites of parenteral administration of medication. There is subcutaneous stranding, possibly suggesting anasarca. Musculoskeletal: Dextroscoliosis is seen in lumbar spine. Decrease in height of body of L2 vertebra has not changed significantly. Central compression in the body of L5 vertebra has not changed. IMPRESSION: There is abnormal diffuse wall thickening in distal descending colon and sigmoid colon consistent with inflammatory or infectious colitis with no significant interval change. There is interval appearance of few loculated fluid collections containing pockets of air adjacent to the sigmoid colon measuring up to 4.5 cm in diameter suggesting multiple pericolic abscesses. Largest of these abscesses is lying superior to the sigmoid colon immediately adjacent to a distal small bowel loop. There is mucosal thickening and narrowing of the lumen in this small bowel loop suggesting inflammatory or infectious ileitis. Small bowel loops proximal to this inflamed ileum is dilated suggesting partial small bowel obstruction due to acute  inflammatory/infectious ileitis. There is loculated fluid collection measuring 9.8 x 5.6 cm in size in lower pelvic cavity with mild wall thickening, possibly suggesting early abscess or seroma in the inferior pelvic cavity anterior to the rectum. There are no pockets of air within this loculated fluid collection. Interval appearance of minimal bilateral pleural effusions. There are linear densities in both lower lung fields suggesting subsegmental atelectasis. Other findings as described in the body of the report. Electronically Signed   By: Elmer Picker M.D.   On: 03/28/2022 16:42   DG Abd Portable 1V  Result Date: 03/28/2022 CLINICAL DATA:  Follow-up ileus. EXAM: PORTABLE ABDOMEN - 1 VIEW COMPARISON:  CT, 03/22/2022. Abdominal radiographs, 03/26/2022 and 03/25/2022. FINDINGS: Dilated central small bowel is without significant change from the most recent prior exam. Scattered air is seen within a normal caliber colon. No gross free air on this supine exam. IMPRESSION: 1. Persistent central abdominal small bowel dilation without colonic dilation. Findings may reflect a persistent small-bowel adynamic ileus or a partial obstruction. Electronically Signed   By: Lajean Manes M.D.   On: 03/28/2022 11:03   DG Abd 2 Views  Result Date: 03/26/2022 CLINICAL DATA:  Constipation EXAM: ABDOMEN - 2 VIEW COMPARISON:  03/25/2022. FINDINGS: Minimal small bowel dilatation. Stool and air overlies the colon and rectosigmoid. No free air identified. Cholecystectomy clips are noted. No radiopaque stones identified. IMPRESSION: Minimal small bowel dilatation likely representing ileus. No obstruction. No change compared to the prior study. Electronically Signed   By: Sammie Bench M.D.   On: 03/26/2022 09:15   DG Abd 2 Views  Result Date: 03/25/2022 CLINICAL DATA:  Lower abdominal pain EXAM: ABDOMEN - 2 VIEW COMPARISON:  None Available. FINDINGS: Dilated small bowel loops.  Colon decompressed. Surgical clips in  the gallbladder fossa. No acute skeletal abnormality. Heavily calcified breast implants bilaterally. IMPRESSION: Dilated small bowel loops consistent with small-bowel obstruction. Progressive small bowel dilatation since the recent CT of 03/22/2022 Electronically Signed   By: Franchot Gallo M.D.   On: 03/25/2022 15:16   DG Chest Port 1 View  Result Date: 03/22/2022 CLINICAL DATA:  Nausea EXAM: PORTABLE CHEST 1 VIEW COMPARISON:  CT 03/22/2022, chest x-ray report 01/15/2014 FINDINGS: Calcified bilateral breast implants. Elevation of the right diaphragm. Linear scarring or atelectasis in the left lower lung. Normal cardiac size. Aortic atherosclerosis. No pneumothorax. IMPRESSION: No active disease. Linear scarring or atelectasis in the left lower lung. Electronically  Signed   By: Donavan Foil M.D.   On: 03/22/2022 15:12   CT Abdomen Pelvis W Contrast  Result Date: 03/22/2022 CLINICAL DATA:  Lower abdominal pain, nausea, and diarrhea beginning yesterday. EXAM: CT ABDOMEN AND PELVIS WITH CONTRAST TECHNIQUE: Multidetector CT imaging of the abdomen and pelvis was performed using the standard protocol following bolus administration of intravenous contrast. RADIATION DOSE REDUCTION: This exam was performed according to the departmental dose-optimization program which includes automated exposure control, adjustment of the mA and/or kV according to patient size and/or use of iterative reconstruction technique. CONTRAST:  136m OMNIPAQUE IOHEXOL 300 MG/ML  SOLN COMPARISON:  None Available. FINDINGS: Lower Chest: No acute findings. Hepatobiliary: No hepatic masses identified. Prior cholecystectomy noted. Mild biliary ductal dilatation noted, likely due to prior cholecystectomy. Pancreas: No mass or inflammatory changes. No evidence of pancreatic ductal dilatation. Spleen: Within normal limits in size and appearance. Adrenals/Urinary Tract: No suspicious masses identified. No evidence of ureteral calculi or  hydronephrosis. Stomach/Bowel: Large amount colonic stool is seen. Mild colonic wall thickening pericolonic soft tissue stranding is seen involving the descending and sigmoid colon which is distended by stool, but shows no evidence of diverticular disease. This is consistent with colitis, and differential diagnosis includes infectious etiologies and stercoral colitis. No evidence of abscess. Vascular/Lymphatic: No pathologically enlarged lymph nodes. No acute vascular findings. Aortic atherosclerotic calcification incidentally noted. Reproductive:  No mass or other significant abnormality. Other:  None. Musculoskeletal:  No suspicious bone lesions identified. IMPRESSION: Mild colitis involving the descending and sigmoid colon, with large amount of stool noted. Differential diagnosis includes infectious etiologies and stercoral colitis. Aortic Atherosclerosis (ICD10-I70.0). Electronically Signed   By: JMarlaine HindM.D.   On: 03/22/2022 12:58    Labs:  CBC: Recent Labs    03/30/22 0501 03/31/22 0349 04/01/22 0341 04/02/22 0321  WBC 11.5* 11.4* 13.2* 16.0*  HGB 10.7* 10.8* 11.4* 11.3*  HCT 32.5* 33.8* 34.5* 34.7*  PLT 475* 516* 538* 540*    COAGS: Recent Labs    03/22/22 1532 04/02/22 0321  INR 1.3* 1.2  APTT 35  --     BMP: Recent Labs    03/30/22 0501 03/31/22 0349 04/01/22 0341 04/02/22 0321  NA 139 139 139 138  K 3.8 3.5 3.5 3.5  CL 106 106 105 105  CO2 '26 26 26 26  '$ GLUCOSE 92 87 95 96  BUN <5* <5* <5* <5*  CALCIUM 8.3* 8.2* 8.5* 8.5*  CREATININE 0.55 0.57 0.56 0.52  GFRNONAA >60 >60 >60 >60    LIVER FUNCTION TESTS: Recent Labs    03/22/22 1110 03/23/22 0749  BILITOT 0.5 0.7  AST 17 15  ALT 14 13  ALKPHOS 47 33*  PROT 7.1 5.4*  ALBUMIN 4.0 2.8*    TUMOR MARKERS: No results for input(s): "AFPTM", "CEA", "CA199", "CHROMGRNA" in the last 8760 hours.  Assessment and Plan: Intra[facility]ab[facility]   Small fluid collections were noted on CT.  She has initiated antibiotic therapy, however now with increasing WBC and ongoing abdominal pain.   Repeat CT Abdomen Pelvis 03/28/22 showed enlarging fluid collections. Case reviewed by Dr. MDwaine Galewho approves patient for TG drain placement.  [facility] is understanding of the goals of hte procedure.  She and family have several questions which are answered.  She is agreeable toproceed.  [facility].  She understands the drain will likely  remain in place for days to week and if presents at discharge will require outpatient follow up with IR.   Risks and benefits discussed with the patient including bleeding, infection, damage to adjacent structures, bowel perforation/fistula connection, and sepsis.  All of the patient's questions were answered, patient is agreeable to proceed. Consent signed and in chart.  Thank you for this interesting consult.  I greatly enjoyed meeting SHAKTI FLEER and look forward to participating in their care.  A copy of this report was sent to the requesting provider on this date.  Electronically Signed: Docia Barrier, PA 04/02/2022, 10:51 AM   I spent a total of 40 Minutes    in face to face in clinical consultation, greater than 50% of which was counseling/coordinating care for intra-abdominal abscess.

## 2022-04-02 NOTE — Progress Notes (Signed)
Brief chart review: patient with CT findings of colitis on 03/22/22. Stools studies negative. Due to worsening pain, repeat CT 12/23 showed persisting abnormal diffuse wall thickening in distal descending colon and sigmoid colon with no significant interval change. New findings suggestive of multiple pericolic abscesses, largest measuring 4.5 cm lying superior to sigmoid colon and immediately adjacent to a distal small bowel loop. This loops has mucosal thickening and narrowing of small bowel lumen suggesting inflammatory or infectious ileitis with proximal dilated small bowel loops. There is also loculated fluid collection 9.8 X 5.6 cm in lower pelvic cavity anterior to rectum, possibly early abscess. loculated fluid collection in lower pelvic area concerning for abscess. General surgery on board. Antibiotic coverage broadened. Patient notes to have some improvement in abdominal pain. WBC up from 13,200 to 16,000. H/H stable. Patient going to [facility] for IR drainage of pelvic abscess.   GI to sign off, call with any questions or concerns. We will arrange for outpatient follow up.   Laureen Ochs. Bernarda Caffey [facility] [phone] 12/28/202310:43 AM

## 2022-04-02 NOTE — Procedures (Signed)
Interventional Radiology Procedure Note  Procedure: CT guided placement of a 79F drain into the pelvic fluid collection via a LEFT transgluteal approach.  Aspiration yields 10 mL cloudy fluid and debris  Complications: None  Estimated Blood Loss: None  Recommendations: - Fluid sent for culture - Drain to JP - Flush drain every 8 hrs   Signed,  Criselda Peaches, MD

## 2022-04-02 NOTE — Telephone Encounter (Signed)
Patient will need hospital follow up with Dr. Jenetta Downer in 4 weeks.

## 2022-04-02 NOTE — Progress Notes (Signed)
Patient returned from [facility] in stable condition , Intra gluteal drain intact and draining, patient up to [facility] . Able to make needs known  04/02/22 1611  Vitals  Temp 98.7 F (37.1 C)  BP 132/76  BP Location Right Arm  BP Method Automatic  Patient Position (if appropriate) Lying  Pulse Rate 87  Resp 20  Level of Consciousness  Level of Consciousness Alert  Oxygen Therapy  SpO2 97 %  O2 Device Room Air

## 2022-04-02 NOTE — Progress Notes (Signed)
Progress Note    Joanna Sutton   PNT:[id number]  DOB: November 09, 1955  DOA: 03/22/2022     9 PCP: Glenda Chroman, MD  Initial CC: Abdominal pain, nausea, vomiting, diarrhea  Hospital Course: Joanna Sutton is a 66 yo female with PMH HTN, diverticulosis, PUD who presented with abdominal pain, nausea/vomiting/diarrhea.  She underwent CT abdomen/pelvis which showed mild colitis involving descending and sigmoid colon with large amount of stool noted.  Concern was for infectious etiology.  GI was consulted.  She also underwent stool testing with C. difficile and GI pathogen panel which were both negative.  She was continued on empiric antibiotics.  Interval History:  - She is back from [facility] after left transgluteal drain placement/aspiration of pelvic fluid  Assessment and Plan:  1)Abdominal Abscess /Colitis  - continues to have nausea, mild pain, bloating, and soft/minimal BM - last CT A/P on 12/17 mostly noting descending and sigmoid mild colitis with large stool burden -CT A/P repeated on 03/28/2022:       1) 4.5 x 2.9 cm loculated collection immediately superior to the sigmoid colon lying adjacent to a small bowel loop which shows narrowing and wall thickening       2) few other small pockets of air and fluid adjacent to the anterior margin of sigmoid colon measuring up to 2 cm            3)  large loculated fluid collection measuring 9.8 x 5.6 cm in size in the posterior pelvic cavity posteroinferior to the sigmoid colon. -Treated with Rocephin/Flagyl from 03/22/22 through 03/29/22 - transitioned to meropenem on 03/29/22 -WBC trending up (11.4 >>13.2) -General surgery consult appreciated -IR consult for left transgluteal drain placemen at [facility] on 04/02/2022-with fluid studies On 04/02/22 patient underwent --CT guided placement of a 3F drain into the pelvic fluid collection via a LEFT transgluteal approach.  Aspiration yields 10 mL cloudy fluid and debris -Recommendations: -  Fluid sent for culture - Drain to JP - Flush drain every 8 hrs   2)Hypokalemia -Repleted   3)Essential hypertension: - BP stable without meds currently  -Hold PTA lisinopril -Use IV hydralazine as needed elevated BP   4)Hyperlipidemia: Continue atorvastatin  5)Chronic normocytic anemia--Hgb stable -Watch for bleeding  6)History of peptic ulcer disease/dysphagia: Continue Protonix   7)Depression: Continue Lexapro  Antimicrobials: Rocephin 03/22/2022 >> 12/24 Flagyl 03/23/2022 >> 12/24 Meropenem 12/24 >> current   DVT prophylaxis:  enoxaparin (LOVENOX) injection 40 mg Start: 04/02/22 1600  Code Status:   Code Status: Full Code  Mobility Assessment (last 72 hours)     Mobility Assessment     Row Name 04/02/22 1025 04/01/22 1419 03/31/22 0922       Does patient have an order for bedrest or is patient medically unstable No - Continue assessment -- No - Continue assessment     What is the highest level of mobility based on the progressive mobility assessment? Level 5 (Walks with assist in room/hall) - Balance while stepping forward/back and can walk in room with assist - Complete Level 5 (Walks with assist in room/hall) - Balance while stepping forward/back and can walk in room with assist - Complete Level 6 (Walks independently in room and hall) - Balance while walking in room without assist - Complete             Barriers to discharge: Infection requiring further IV antibiotics WBC continues to trend up Disposition Plan: pending clinical improvement  Status is: Inpatient  Objective: Blood  pressure 124/67, pulse 67, temperature 98.8 F (37.1 C), resp. rate 16, height '5\' 1"'$  (1.549 m), weight 62.8 kg, SpO2 97 %.    Physical Exam  Gen:- Awake Alert, in no acute distress  HEENT:- [place].AT, No sclera icterus Neck-Supple Neck,No JVD,.  Lungs-  CTAB , fair air movement bilaterally  CV- S1, S2 normal, RRR Abd-  +ve B.Sounds, Abd Soft, generalized abdominal tenderness  especially in the lower left quadrant, no rebound -Left gluteal area JP drain in situ Extremity/Skin:- No  edema,   good pedal pulses  Psych-affect is appropriate, oriented x3 Neuro-no new focal deficits, no tremors  Consultants:  GI IR  Procedures:  left transgluteal drain placemen at [facility] 04/02/22  Data Reviewed: Results for orders placed or performed during the hospital encounter of 03/22/22 (from the past 24 hour(s))  CBC with Differential/Platelet     Status: Abnormal   Collection Time: 04/02/22  3:21 AM  Result Value Ref Range   WBC 16.0 (H) 4.0 - 10.5 K/uL   RBC 3.65 (L) 3.87 - 5.11 MIL/uL   Hemoglobin 11.3 (L) 12.0 - 15.0 g/dL   HCT 34.7 (L) 36.0 - 46.0 %   MCV 95.1 80.0 - 100.0 fL   MCH 31.0 26.0 - 34.0 pg   MCHC 32.6 30.0 - 36.0 g/dL   RDW 13.6 11.5 - 15.5 %   Platelets 540 (H) 150 - 400 K/uL   nRBC 0.0 0.0 - 0.2 %   Neutrophils Relative % 75 %   Neutro Abs 12.1 (H) 1.7 - 7.7 K/uL   Lymphocytes Relative 14 %   Lymphs Abs 2.2 0.7 - 4.0 K/uL   Monocytes Relative 8 %   Monocytes Absolute 1.2 (H) 0.1 - 1.0 K/uL   Eosinophils Relative 1 %   Eosinophils Absolute 0.1 0.0 - 0.5 K/uL   Basophils Relative 1 %   Basophils Absolute 0.1 0.0 - 0.1 K/uL   Immature Granulocytes 1 %   Abs Immature Granulocytes 0.21 (H) 0.00 - 0.07 K/uL  Magnesium     Status: None   Collection Time: 04/02/22  3:21 AM  Result Value Ref Range   Magnesium 2.4 1.7 - 2.4 mg/dL  Basic metabolic panel     Status: Abnormal   Collection Time: 04/02/22  3:21 AM  Result Value Ref Range   Sodium 138 135 - 145 mmol/L   Potassium 3.5 3.5 - 5.1 mmol/L   Chloride 105 98 - 111 mmol/L   CO2 26 22 - 32 mmol/L   Glucose, Bld 96 70 - 99 mg/dL   BUN <5 (L) 8 - 23 mg/dL   Creatinine, Ser 0.52 0.44 - 1.00 mg/dL   Calcium 8.5 (L) 8.9 - 10.3 mg/dL   GFR, Estimated >60 >60 mL/min   Anion gap 7 5 - 15  Protime-INR     Status: None   Collection Time: 04/02/22  3:21 AM  Result Value Ref Range   Prothrombin Time  15.0 11.4 - 15.2 seconds   INR 1.2 0.8 - 1.2    3 LOS: 9 days   Roxan Hockey, MD [facility] 04/02/2022, 4:10 PM

## 2022-04-03 DIAGNOSIS — K529 Noninfective gastroenteritis and colitis, unspecified: Secondary | ICD-10-CM | POA: Diagnosis not present

## 2022-04-03 DIAGNOSIS — I10 Essential (primary) hypertension: Secondary | ICD-10-CM | POA: Diagnosis not present

## 2022-04-03 LAB — CBC WITH DIFFERENTIAL/PLATELET
Abs Immature Granulocytes: 0.16 10*3/uL — ABNORMAL HIGH (ref 0.00–0.07)
Basophils Absolute: 0.1 10*3/uL (ref 0.0–0.1)
Basophils Relative: 0 %
Eosinophils Absolute: 0.1 10*3/uL (ref 0.0–0.5)
Eosinophils Relative: 0 %
HCT: 35.5 % — ABNORMAL LOW (ref 36.0–46.0)
Hemoglobin: 11.4 g/dL — ABNORMAL LOW (ref 12.0–15.0)
Immature Granulocytes: 1 %
Lymphocytes Relative: 12 %
Lymphs Abs: 2.2 10*3/uL (ref 0.7–4.0)
MCH: 31 pg (ref 26.0–34.0)
MCHC: 32.1 g/dL (ref 30.0–36.0)
MCV: 96.5 fL (ref 80.0–100.0)
Monocytes Absolute: 1.2 10*3/uL — ABNORMAL HIGH (ref 0.1–1.0)
Monocytes Relative: 7 %
Neutro Abs: 14.2 10*3/uL — ABNORMAL HIGH (ref 1.7–7.7)
Neutrophils Relative %: 80 %
Platelets: 607 10*3/uL — ABNORMAL HIGH (ref 150–400)
RBC: 3.68 MIL/uL — ABNORMAL LOW (ref 3.87–5.11)
RDW: 13.6 % (ref 11.5–15.5)
WBC: 17.8 10*3/uL — ABNORMAL HIGH (ref 4.0–10.5)
nRBC: 0 % (ref 0.0–0.2)

## 2022-04-03 LAB — BASIC METABOLIC PANEL
Anion gap: 9 (ref 5–15)
BUN: 6 mg/dL — ABNORMAL LOW (ref 8–23)
CO2: 23 mmol/L (ref 22–32)
Calcium: 8.4 mg/dL — ABNORMAL LOW (ref 8.9–10.3)
Chloride: 105 mmol/L (ref 98–111)
Creatinine, Ser: 0.52 mg/dL (ref 0.44–1.00)
GFR, Estimated: >60 mL/min (ref >60)
Glucose, Bld: 91 mg/dL (ref 70–99)
Potassium: 3.5 mmol/L (ref 3.5–5.1)
Sodium: 137 mmol/L (ref 135–145)

## 2022-04-03 LAB — MAGNESIUM: Magnesium: 2.5 mg/dL — ABNORMAL HIGH (ref 1.7–2.4)

## 2022-04-03 MED ORDER — MELATONIN 3 MG PO TABS
9.0000 mg | ORAL_TABLET | Freq: Every evening | ORAL | Status: DC | PRN
  Administered 2022-04-04: 9 mg via ORAL
  Filled 2022-04-03: qty 3

## 2022-04-03 MED ORDER — METRONIDAZOLE 500 MG/100ML IV SOLN
500.0000 mg | Freq: Two times a day (BID) | INTRAVENOUS | Status: DC
  Administered 2022-04-03 – 2022-04-04 (×2): 500 mg via INTRAVENOUS
  Filled 2022-04-03 (×2): qty 100

## 2022-04-03 MED ORDER — SODIUM CHLORIDE 0.9 % IV SOLN
2.0000 g | INTRAVENOUS | Status: DC
  Administered 2022-04-03: 2 g via INTRAVENOUS
  Filled 2022-04-03: qty 20

## 2022-04-03 NOTE — Progress Notes (Signed)
Progress Note    Joanna Sutton   ZOX:[id number]  DOB: 05-07-1955  DOA: 03/22/2022     10 PCP: Glenda Chroman, MD  Initial CC: Abdominal pain, nausea, vomiting, diarrhea  Hospital Course: Ms. Lanter is a 66 yo female with PMH HTN, diverticulosis, PUD who presented with abdominal pain, nausea/vomiting/diarrhea.  She underwent CT abdomen/pelvis which showed mild colitis involving descending and sigmoid colon with large amount of stool noted.  Concern was for infectious etiology.  GI was consulted.  She also underwent stool testing with C. difficile and GI pathogen panel which were both negative.  She was continued on empiric antibiotics.  Interval History:  - -Family friend at bedside, questions answered No fever  Or chills   No Nausea, Vomiting or Diarrhea  Assessment and Plan:  1)Abdominal Abscess /Colitis  - - last CT A/P on 12/17 mostly noting descending and sigmoid mild colitis with large stool burden -CT A/P repeated on 03/28/2022:       1) 4.5 x 2.9 cm loculated collection immediately superior to the sigmoid colon lying adjacent to a small bowel loop which shows narrowing and wall thickening       2) few other small pockets of air and fluid adjacent to the anterior margin of sigmoid colon measuring up to 2 cm            3)  large loculated fluid collection measuring 9.8 x 5.6 cm in size in the posterior pelvic cavity posteroinferior to the sigmoid colon. -Treated with Rocephin/Flagyl from 03/22/22 through 03/29/22 - transitioned to meropenem on 03/29/22 -WBC trending up since switching from Flagyl Rocephin to meropenem so we will switch back to Flagyl and Rocephin as of 04/03/2022 -General surgery consult appreciated -IR consult for left transgluteal drain placemen at [facility] on 04/02/2022-with fluid studies On 04/02/22 patient underwent --CT guided placement of a 93F drain into the pelvic fluid collection via a LEFT transgluteal approach.  Aspiration yields 10 mL cloudy fluid  and debris -Recommendations: - Fluid sent for culture - Drain to JP - Flush drain every 8 hrs  04/03/22-had BM, no fevers and abdominal discomfort is improving   2)Hypokalemia -Repleted   3)Essential hypertension: - BP stable without meds currently  -Hold PTA lisinopril -Use IV hydralazine as needed elevated BP   4)Hyperlipidemia: Continue atorvastatin  5)Chronic normocytic anemia--Hgb stable -Watch for bleeding  6)History of peptic ulcer disease/dysphagia: Continue Protonix   7)Depression: Continue Lexapro  Antimicrobials: Rocephin 03/22/2022 >> 12/24 Flagyl 03/23/2022 >> 12/24 Meropenem 12/24 >> 04/03/22 -Back on Rocephin and Flagyl starting 04/03/2022  DVT prophylaxis:  enoxaparin (LOVENOX) injection 40 mg Start: 04/02/22 1600  Code Status:   Code Status: Full Code  Mobility Assessment (last 72 hours)     Mobility Assessment     Row Name 04/02/22 1025 04/01/22 1419         Does patient have an order for bedrest or is patient medically unstable No - Continue assessment --      What is the highest level of mobility based on the progressive mobility assessment? Level 5 (Walks with assist in room/hall) - Balance while stepping forward/back and can walk in room with assist - Complete Level 5 (Walks with assist in room/hall) - Balance while stepping forward/back and can walk in room with assist - Complete              Barriers to discharge: Infection requiring further IV antibiotics WBC continues to trend up Disposition Plan: pending clinical improvement  Status is: Inpatient  Objective: Blood pressure 109/65, pulse 88, temperature 98.1 F (36.7 C), resp. rate (!) 22, height '5\' 1"'$  (1.549 m), weight 62.8 kg, SpO2 96 %.    Physical Exam  Gen:- Awake Alert, in no acute distress  HEENT:- [place].AT, No sclera icterus Neck-Supple Neck,No JVD,.  Lungs-  CTAB , fair air movement bilaterally  CV- S1, S2 normal, RRR Abd-  +ve B.Sounds, Abd Soft, much improved  abdominal discomfort on palpation -Left gluteal area JP drain in situ with serosanguineous drainage Extremity/Skin:- No  edema,   good pedal pulses  Psych-affect is appropriate, oriented x3 Neuro-no new focal deficits, no tremors  Consultants:  GI IR  Procedures:  left transgluteal drain placemen at [facility] 04/02/22  Data Reviewed: Results for orders placed or performed during the hospital encounter of 03/22/22 (from the past 24 hour(s))  CBC with Differential/Platelet     Status: Abnormal   Collection Time: 04/03/22  3:18 AM  Result Value Ref Range   WBC 17.8 (H) 4.0 - 10.5 K/uL   RBC 3.68 (L) 3.87 - 5.11 MIL/uL   Hemoglobin 11.4 (L) 12.0 - 15.0 g/dL   HCT 35.5 (L) 36.0 - 46.0 %   MCV 96.5 80.0 - 100.0 fL   MCH 31.0 26.0 - 34.0 pg   MCHC 32.1 30.0 - 36.0 g/dL   RDW 13.6 11.5 - 15.5 %   Platelets 607 (H) 150 - 400 K/uL   nRBC 0.0 0.0 - 0.2 %   Neutrophils Relative % 80 %   Neutro Abs 14.2 (H) 1.7 - 7.7 K/uL   Lymphocytes Relative 12 %   Lymphs Abs 2.2 0.7 - 4.0 K/uL   Monocytes Relative 7 %   Monocytes Absolute 1.2 (H) 0.1 - 1.0 K/uL   Eosinophils Relative 0 %   Eosinophils Absolute 0.1 0.0 - 0.5 K/uL   Basophils Relative 0 %   Basophils Absolute 0.1 0.0 - 0.1 K/uL   Immature Granulocytes 1 %   Abs Immature Granulocytes 0.16 (H) 0.00 - 0.07 K/uL  Magnesium     Status: Abnormal   Collection Time: 04/03/22  3:18 AM  Result Value Ref Range   Magnesium 2.5 (H) 1.7 - 2.4 mg/dL  Basic metabolic panel     Status: Abnormal   Collection Time: 04/03/22  3:18 AM  Result Value Ref Range   Sodium 137 135 - 145 mmol/L   Potassium 3.5 3.5 - 5.1 mmol/L   Chloride 105 98 - 111 mmol/L   CO2 23 22 - 32 mmol/L   Glucose, Bld 91 70 - 99 mg/dL   BUN 6 (L) 8 - 23 mg/dL   Creatinine, Ser 0.52 0.44 - 1.00 mg/dL   Calcium 8.4 (L) 8.9 - 10.3 mg/dL   GFR, Estimated >60 >60 mL/min   Anion gap 9 5 - 15    3 LOS: 10 days   Roxan Hockey, MD [facility] 04/03/2022, 7:48 PM

## 2022-04-03 NOTE — Progress Notes (Signed)
Subjective: Patient continues to feel better today.  Denies any nausea or vomiting.  She denies any significant abdominal pain.  She is anxious about being discharged.  Objective: Vital signs in last 24 hours: Temp:  [98.1 F (36.7 C)-98.7 F (37.1 C)] 98.1 F (36.7 C) (12/29 0524) Pulse Rate:  [82-97] 82 (12/29 0524) Resp:  [16-20] 16 (12/29 0524) BP: (111-132)/(76-83) 116/77 (12/29 0524) SpO2:  [95 %-97 %] 95 % (12/29 0524) Last BM Date : 04/02/22  Intake/Output from previous day: 12/28 0701 - 12/29 0700 In: -  Out: 34 [Drains:34] Intake/Output this shift: Total I/O In: 360 [P.O.:360] Out: -   General appearance: alert, cooperative, and no distress GI: Soft.  No significant tenderness noted.  Drain in place with the bulb with minimal fluid collection.  Lab Results:  Recent Labs    04/02/22 0321 04/03/22 0318  WBC 16.0* 17.8*  HGB 11.3* 11.4*  HCT 34.7* 35.5*  PLT 540* 607*   BMET Recent Labs    04/02/22 0321 04/03/22 0318  NA 138 137  K 3.5 3.5  CL 105 105  CO2 26 23  GLUCOSE 96 91  BUN <5* 6*  CREATININE 0.52 0.52  CALCIUM 8.5* 8.4*   PT/INR Recent Labs    04/02/22 0321  LABPROT 15.0  INR 1.2    Studies/Results: CT GUIDED PERITONEAL/RETROPERITONEAL FLUID DRAIN BY PERC CATH  Result Date: 04/02/2022 INDICATION: 66 year old female with colitis and multiple intra-abdominal fluid collections. She presents for attempted placement of a CT-guided drainage catheter. EXAM: CT-guided drain placement TECHNIQUE: Multidetector CT imaging of the pelvis was performed following the standard protocol without IV contrast. RADIATION DOSE REDUCTION: This exam was performed according to the departmental dose-optimization program which includes automated exposure control, adjustment of the mA and/or kV according to patient size and/or use of iterative reconstruction technique. MEDICATIONS: The patient is currently admitted to the hospital and receiving intravenous  antibiotics. The antibiotics were administered within an appropriate time frame prior to the initiation of the procedure. ANESTHESIA/SEDATION: Moderate (conscious) sedation was employed during this procedure. A total of Versed 2 mg and Fentanyl 100 mcg was administered intravenously by the radiology nurse. Total intra-service moderate Sedation Time: 15 minutes. The patient's level of consciousness and vital signs were monitored continuously by radiology nursing throughout the procedure under my direct supervision. COMPLICATIONS: None immediate. PROCEDURE: Informed written consent was obtained from the patient after a thorough discussion of the procedural risks, benefits and alternatives. All questions were addressed. Maximal Sterile Barrier Technique was utilized including caps, mask, sterile gowns, sterile gloves, sterile drape, hand hygiene and skin antiseptic. A timeout was performed prior to the initiation of the procedure. The patient was placed in the prone position. Axial CT imaging was performed. Approximately 3.8 x 3.1 cm high attenuation gas and fluid collection present within the small bowel mesentery and nearly completely encircled by loops of small bowel. This is not approachable percutaneously. More inferiorly, of the fluid collection in the left pelvic cul-de-sac is well visualized. A suitable skin entry site from a left transgluteal approach was selected. The skin was sterilely prepped and draped in the standard fashion using chlorhexidine skin prep. Local anesthesia was attained by infiltration with 1% lidocaine. Under intermittent CT guidance, an 18 gauge trocar needle was advanced along a parasacral pathway and into the fluid collection. A wire was then coiled in the collection. The skin tract was dilated to 10 [place]. A 10 French all-purpose drainage catheter was then advanced over the wire and formed.  Aspiration yields approximately 10 mL of purulent fluid and debris. The catheter was lavaged  and connected to JP bulb suction before being secured to the skin with 0 Prolene suture. IMPRESSION: 1. Contained colonic or small bowel perforation containing previously ingested oral contrast material within the small bowel mesentery is nearly completely surrounded by loops of small bowel and is not approachable percutaneously. 2. Successful placement of a 10 French drainage catheter into the pelvic cul-de-sac fluid collection via a left transgluteal approach. Aspiration yields approximately 10 mL of purulent fluid and debris. Samples were sent for Gram stain and culture. Electronically Signed   By: Jacqulynn Cadet M.D.   On: 04/02/2022 15:35    Anti-infectives: Anti-infectives (From admission, onward)    Start     Dose/Rate Route Frequency Ordered Stop   03/29/22 1200  meropenem (MERREM) 1 g in sodium chloride 0.9 % 100 mL IVPB       See Hyperspace for full Linked Orders Report.   1 g 200 mL/hr over 30 Minutes Intravenous Every 8 hours 03/29/22 1114     03/29/22 1200  meropenem (MERREM) 1 g in sodium chloride 0.9 % 100 mL IVPB  Status:  Discontinued       See Hyperspace for full Linked Orders Report.   1 g 200 mL/hr over 30 Minutes Intravenous Every 8 hours 03/29/22 1114 03/31/22 1328   03/29/22 1145  meropenem (MERREM) 2 g in sodium chloride 0.9 % 100 mL IVPB  Status:  Discontinued        2 g 280 mL/hr over 30 Minutes Intravenous Every 8 hours 03/29/22 1046 03/29/22 1115   03/23/22 1400  metroNIDAZOLE (FLAGYL) IVPB 500 mg  Status:  Discontinued        500 mg 100 mL/hr over 60 Minutes Intravenous Every 12 hours 03/23/22 1300 03/29/22 1013   03/23/22 1200  cefTRIAXone (ROCEPHIN) 1 g in sodium chloride 0.9 % 100 mL IVPB  Status:  Discontinued        1 g 200 mL/hr over 30 Minutes Intravenous Every 24 hours 03/22/22 1612 03/29/22 1013   03/22/22 1345  cefTRIAXone (ROCEPHIN) 1 g in sodium chloride 0.9 % 100 mL IVPB        1 g 200 mL/hr over 30 Minutes Intravenous  Once 03/22/22 1337 03/22/22  1503   03/22/22 0000  cefdinir (OMNICEF) 300 MG capsule        600 mg Oral Daily 03/22/22 1435 03/29/22 2359   03/22/22 0000  metroNIDAZOLE (FLAGYL) 500 MG tablet        500 mg Oral 3 times daily 03/22/22 1435 03/29/22 2359       Assessment/Plan: Impression: Sigmoid colitis.  The large fluid collection that was drained by IR yesterday shows no white blood cells or bacteria present.  Cultures are still pending.  Leukocytosis still present and it is unknown whether this is due to a frank infection versus inflammatory response from her resolving colitis.  She does not need acute surgical intervention at the present time.  I will discuss with Dr. Denton Brick oral antibiotic course.  I will see her in my office next Thursday to assess the drain.  In talking with the patient and the family, they are agreeable to for discharge in the morning.   LOS: 10 days    Aviva Signs 04/03/2022

## 2022-04-03 NOTE — Sedation Documentation (Signed)
[facility] at the bedside to take patient back to [place] given with previous report called to [place]. Drain site is clean, dry and intact. V/s WNL at discharge.

## 2022-04-03 NOTE — Care Management Important Message (Signed)
Important Message  Patient Details  Name: Joanna Sutton MRN: [id number] Date of Birth: 05/13/1955   Medicare Important Message Given:  Yes     Tommy Medal 04/03/2022, 1:50 PM

## 2022-04-03 NOTE — Progress Notes (Addendum)
Overnight, patient was A&O x 4, got up x 3 to the [facility] JP drained 24 mL serosanguinous fluid. RN charged bulb after emptying. Pt did not c/o pain. Continued scheduled IV antiobiotics.

## 2022-04-04 DIAGNOSIS — K529 Noninfective gastroenteritis and colitis, unspecified: Secondary | ICD-10-CM | POA: Diagnosis not present

## 2022-04-04 LAB — CBC WITH DIFFERENTIAL/PLATELET
Abs Immature Granulocytes: 0.1 10*3/uL — ABNORMAL HIGH (ref 0.00–0.07)
Basophils Absolute: 0.1 10*3/uL (ref 0.0–0.1)
Basophils Relative: 1 %
Eosinophils Absolute: 0.1 10*3/uL (ref 0.0–0.5)
Eosinophils Relative: 1 %
HCT: 32.6 % — ABNORMAL LOW (ref 36.0–46.0)
Hemoglobin: 10.6 g/dL — ABNORMAL LOW (ref 12.0–15.0)
Immature Granulocytes: 1 %
Lymphocytes Relative: 13 %
Lymphs Abs: 2 10*3/uL (ref 0.7–4.0)
MCH: 30.9 pg (ref 26.0–34.0)
MCHC: 32.5 g/dL (ref 30.0–36.0)
MCV: 95 fL (ref 80.0–100.0)
Monocytes Absolute: 1 10*3/uL (ref 0.1–1.0)
Monocytes Relative: 7 %
Neutro Abs: 12 10*3/uL — ABNORMAL HIGH (ref 1.7–7.7)
Neutrophils Relative %: 77 %
Platelets: 536 10*3/uL — ABNORMAL HIGH (ref 150–400)
RBC: 3.43 MIL/uL — ABNORMAL LOW (ref 3.87–5.11)
RDW: 13.4 % (ref 11.5–15.5)
WBC: 15.2 10*3/uL — ABNORMAL HIGH (ref 4.0–10.5)
nRBC: 0 % (ref 0.0–0.2)

## 2022-04-04 LAB — BASIC METABOLIC PANEL
Anion gap: 5 (ref 5–15)
BUN: 5 mg/dL — ABNORMAL LOW (ref 8–23)
CO2: 25 mmol/L (ref 22–32)
Calcium: 8.2 mg/dL — ABNORMAL LOW (ref 8.9–10.3)
Chloride: 105 mmol/L (ref 98–111)
Creatinine, Ser: 0.53 mg/dL (ref 0.44–1.00)
GFR, Estimated: >60 mL/min (ref >60)
Glucose, Bld: 119 mg/dL — ABNORMAL HIGH (ref 70–99)
Potassium: 3.4 mmol/L — ABNORMAL LOW (ref 3.5–5.1)
Sodium: 135 mmol/L (ref 135–145)

## 2022-04-04 LAB — MAGNESIUM: Magnesium: 2.3 mg/dL (ref 1.7–2.4)

## 2022-04-04 MED ORDER — METRONIDAZOLE 500 MG PO TABS: 500.0000 mg | ORAL_TABLET | Freq: Three times a day (TID) | ORAL | 0 refills | Status: AC

## 2022-04-04 MED ORDER — POTASSIUM CHLORIDE CRYS ER 20 MEQ PO TBCR
40.0000 meq | EXTENDED_RELEASE_TABLET | ORAL | Status: AC
  Administered 2022-04-04 (×2): 40 meq via ORAL
  Filled 2022-04-04 (×2): qty 2

## 2022-04-04 MED ORDER — CEFDINIR 300 MG PO CAPS: 300.0000 mg | ORAL_CAPSULE | Freq: Two times a day (BID) | ORAL | 0 refills | Status: DC

## 2022-04-04 NOTE — Discharge Summary (Signed)
Joanna Sutton, is a 66 y.o. female  DOB Jul 25, 1955  MRN [id number].  Admission date:  03/22/2022  Admitting Physician  Charlynne Cousins, MD  Discharge Date:  04/04/2022   Primary MD  Glenda Chroman, MD  Recommendations for primary care physician for things to follow:   1) please take cefdinir/Omnicef antibiotic along with Flagyl/metronidazole antibiotic as prescribed 2) please follow-up with general surgeon Dr. Aviva Signs in about 5 days or so as advised 3) repeat CBC and BMP blood test on Tuesday, April 07, 2022 advised 4) left buttock area JP drain care as advised--  Admission Diagnosis  Colitis [K52.9] Sepsis, due to unspecified organism, unspecified whether acute organ dysfunction present [facility]) [A41.9]   Discharge Diagnosis  Colitis [K52.9] Sepsis, due to unspecified organism, unspecified whether acute organ dysfunction present [facility]) [A41.9]    Principal Problem:   Colitis Active Problems:   Generalized abdominal pain   Essential hypertension, benign   High cholesterol   Gastroesophageal reflux disease without esophagitis   Abdominal pain, left lower quadrant   Chronic idiopathic constipation   Diarrhea   Hypokalemia   Depression   Sepsis ([place])      Past Medical History:  Diagnosis Date   Depression    Diarrhea    GERD (gastroesophageal reflux disease)    High cholesterol    Hypertension     Past Surgical History:  Procedure Laterality Date   APPENDECTOMY     AUGMENTATION MAMMAPLASTY     BIOPSY  01/13/2022   Procedure: BIOPSY;  Surgeon: Harvel Quale, MD;  [facility];  Service: Gastroenterology;;   BREAST ENHANCEMENT SURGERY     CESAREAN SECTION     CHOLECYSTECTOMY     COLONOSCOPY WITH PROPOFOL N/A 01/13/2022   Procedure: COLONOSCOPY WITH PROPOFOL;  Surgeon: Harvel Quale, MD;  [facility];  Service: Gastroenterology;   Laterality: N/A;  1030 am ASA 2   ESOPHAGEAL DILATION N/A 09/27/2014   Procedure: ESOPHAGEAL DILATION;  Surgeon: Rogene Houston, MD;  [facility];  Service: Endoscopy;  Laterality: N/A;   ESOPHAGOGASTRODUODENOSCOPY N/A 09/27/2014   Procedure: ESOPHAGOGASTRODUODENOSCOPY (EGD);  Surgeon: Rogene Houston, MD;  [facility];  Service: Endoscopy;  Laterality: N/A;  930   ESOPHAGOGASTRODUODENOSCOPY N/A 01/03/2015   Procedure: ESOPHAGOGASTRODUODENOSCOPY (EGD);  Surgeon: Rogene Houston, MD;  [facility];  Service: Endoscopy;  Laterality: N/A;  300   ESOPHAGOGASTRODUODENOSCOPY (EGD) WITH PROPOFOL N/A 01/13/2022   Procedure: ESOPHAGOGASTRODUODENOSCOPY (EGD) WITH PROPOFOL;  Surgeon: Harvel Quale, MD;  [facility];  Service: Gastroenterology;  Laterality: N/A;   NASAL SINUS SURGERY     PATELLA FRACTURE SURGERY Left    around 2016   POLYPECTOMY  01/13/2022   Procedure: POLYPECTOMY;  Surgeon: Harvel Quale, MD;  [facility];  Service: Gastroenterology;;   SUBMUCOSAL LIFTING INJECTION  01/13/2022   Procedure: SUBMUCOSAL LIFTING INJECTION;  Surgeon: Harvel Quale, MD;  [facility];  Service: Gastroenterology;;  SUBMUCOSAL TATTOO INJECTION  01/13/2022   Procedure: SUBMUCOSAL TATTOO INJECTION;  Surgeon: Montez Morita, Quillian Quince, MD;  [facility];  Service: Gastroenterology;;       HPI  from the history and physical done on the day of admission:    Chief Complaint: Abdominal pain, watery diarrhea, nausea   HPI: Joanna Sutton is a 66 y.o. female with medical history significant of HTN, HLD, depression, GERD, comes to the hospital with complaints of abdominal pain, nausea, diarrhea for the past 2 days.  She is not sure, but feels that it may have been related to eating out the day prior to admission.  She went to a [place] where she had Shrimp fajita, and tells me she has  gotten sick from that place before but not always.  She has been followed by gastroenterology for rectal bleeding and dysphagia, recently in the past October she had a colonoscopy with several polyps removed and diverticulosis, and and upper endoscopy with a dilated nonobstructive Schatzki ring as well as nonbleeding gastric ulcers with a clean ulcer base.  Currently reports abdominal pain, throughout, with nausea and poor p.o. intake.  She denies any fever or chills.  She denies any blood in the stools and tells me that her stools are just pure liquid.  She denies any vomiting.  No recent medication changes or antibiotics   ED Course: In the ED she was found to have a temp of 101.1, mild leukocytosis with white count of 12.1, and a CT scan of the abdomen and pelvis showing mild colitis of the descending and sigmoid colon, with large amount of stool noted, potentially infectious and stercoral colitis.  She was given ceftriaxone, fluids and we are asked to admit   Review of Systems: All systems reviewed, and apart from HPI, all negative     Hospital Course:     Joanna Sutton is a 66 yo female with PMH HTN, diverticulosis, PUD who presented with abdominal pain, nausea/vomiting/diarrhea.  She underwent CT abdomen/pelvis which showed mild colitis involving descending and sigmoid colon with large amount of stool noted.  Concern was for infectious etiology.  GI was consulted.  She also underwent stool testing with C. difficile and GI pathogen panel which were both negative.  She was continued on empiric antibiotics.  Assessment and Plan: 1)Abdominal Abscess /Colitis  - - last CT A/P on 12/17 mostly noting descending and sigmoid mild colitis with large stool burden -CT A/P repeated on 03/28/2022:       1) 4.5 x 2.9 cm loculated collection immediately superior to the sigmoid colon lying adjacent to a small bowel loop which shows narrowing and wall thickening       2) few other small pockets of air and fluid  adjacent to the anterior margin of sigmoid colon measuring up to 2 cm            3)  large loculated fluid collection measuring 9.8 x 5.6 cm in size in the posterior pelvic cavity posteroinferior to the sigmoid colon. -Treated with Rocephin/Flagyl from 03/22/22 through 03/29/22 - transitioned to meropenem on 03/29/22 -WBC trending up since switching from Flagyl Rocephin to meropenem so we will switch back to Flagyl and Rocephin as of 04/03/2022 -Patient feels better WBC is starting to trend down again with discharge home on Omnicef and Flagyl as recommended by general surgery -General surgery consult appreciated -IR consult for left transgluteal drain placemen at [facility] on 04/02/2022-with fluid studies On 04/02/22 patient underwent --CT  guided placement of a 83F drain into the pelvic fluid collection via a LEFT transgluteal approach.  Aspiration yields 10 mL cloudy fluid and debris -Recommendations: - Fluid sent for culture - Drain to JP---output has slowed down quite a bit   2)Hypokalemia -Repleted   3)Essential hypertension: -Resume PTA meds   4)Hyperlipidemia: Continue atorvastatin   5)Chronic normocytic anemia--Hgb stable -No bleeding concerns   6)History of peptic ulcer disease/dysphagia: Continue Protonix   7)Depression: Continue Lexapro    Discharge Condition: Follow-up with general surgeon for repeat CBC and drain and abdomen evaluation  Follow UP   Follow-up Information     [facility] [place] [facility].   Specialty: Home Health Services Why: RN will call to schedule your first home visit. Contact information: [address] [place] [address] [phone]                  Consults obtained -General surgery and IR  Diet and Activity recommendation:  As advised  Discharge Instructions    Discharge Instructions     Call MD for:  difficulty breathing, headache or visual disturbances   Complete by: As directed    Call MD for:   persistant dizziness or light-headedness   Complete by: As directed    Call MD for:  persistant nausea and vomiting   Complete by: As directed    Call MD for:  severe uncontrolled pain   Complete by: As directed    Call MD for:  temperature >100.4   Complete by: As directed    Diet - low sodium heart healthy   Complete by: As directed    Discharge instructions   Complete by: As directed    1) please take cefdinir/Omnicef antibiotic along with Flagyl/metronidazole antibiotic as prescribed 2) please follow-up with general surgeon Dr. Aviva Signs in about 5 days or so as advised 3) repeat CBC and BMP blood test on Tuesday, April 07, 2022 advised 4) left buttock area JP drain care as advised--   Discharge wound care:   Complete by: As directed    Left buttock area JP drain care as advised   Increase activity slowly   Complete by: As directed    Leave dressing on - Keep it clean, dry, and intact until clinic visit   Complete by: As directed         Discharge Medications     Allergies as of 04/04/2022       Reactions   Bactrim [sulfamethoxazole-trimethoprim] Hives   Levaquin [levofloxacin] Nausea And Vomiting   Penicillins Rash        Medication List     TAKE these medications    acetaminophen 500 MG tablet Commonly known as: TYLENOL Take 500 mg by mouth every 6 (six) hours as needed for pain.   atorvastatin 40 MG tablet Commonly known as: LIPITOR Take 40 mg by mouth daily.   augmented betamethasone dipropionate 0.05 % cream Commonly known as: DIPROLENE-AF Apply 1 Application topically 2 (two) times daily.   benzonatate 200 MG capsule Commonly known as: TESSALON Take 200 mg by mouth 3 (three) times daily as needed.   cefdinir 300 MG capsule Commonly known as: OMNICEF Take 1 capsule (300 mg total) by mouth 2 (two) times daily for 5 days.   cetirizine 10 MG tablet Commonly known as: ZYRTEC Take 10 mg by mouth daily.   cyanocobalamin 1000 MCG  tablet Commonly known as: VITAMIN B12 Take 2,000 mcg by mouth daily.   escitalopram  10 MG tablet Commonly known as: LEXAPRO Take 10 mg by mouth daily.   lisinopril 10 MG tablet Commonly known as: ZESTRIL Take 10-20 mg by mouth daily.   LORazepam 0.5 MG tablet Commonly known as: ATIVAN Take 0.5 mg by mouth at bedtime as needed for sleep.   metroNIDAZOLE 500 MG tablet Commonly known as: FLAGYL Take 1 tablet (500 mg total) by mouth 3 (three) times daily for 5 days.   pantoprazole 40 MG tablet Commonly known as: PROTONIX Take 1 tablet (40 mg total) by mouth 2 (two) times daily before a meal. What changed: when to take this   polyethylene glycol 17 g packet Commonly known as: MiraLax Take 17 g by mouth daily.   potassium chloride 10 MEQ tablet Commonly known as: KLOR-CON Take 10 mEq by mouth daily as needed.   Retin-A 0.025 % cream Generic drug: tretinoin Apply 1 Application topically at bedtime.               Discharge Care Instructions  (From admission, onward)           Start     Ordered   04/04/22 0000  Discharge wound care:       Comments: Left buttock area JP drain care as advised   04/04/22 1238   04/04/22 0000  Leave dressing on - Keep it clean, dry, and intact until clinic visit        04/04/22 1238           Major procedures and Radiology Reports - PLEASE review detailed and final reports for all details, in brief -   CT GUIDED PERITONEAL/RETROPERITONEAL FLUID DRAIN BY PERC CATH  Result Date: 04/02/2022 INDICATION: 66 year old female with colitis and multiple intra-abdominal fluid collections. She presents for attempted placement of a CT-guided drainage catheter. EXAM: CT-guided drain placement TECHNIQUE: Multidetector CT imaging of the pelvis was performed following the standard protocol without IV contrast. RADIATION DOSE REDUCTION: This exam was performed according to the departmental dose-optimization program which includes automated  exposure control, adjustment of the mA and/or kV according to patient size and/or use of iterative reconstruction technique. MEDICATIONS: The patient is currently admitted to the hospital and receiving intravenous antibiotics. The antibiotics were administered within an appropriate time frame prior to the initiation of the procedure. ANESTHESIA/SEDATION: Moderate (conscious) sedation was employed during this procedure. A total of Versed 2 mg and Fentanyl 100 mcg was administered intravenously by the radiology nurse. Total intra-service moderate Sedation Time: 15 minutes. The patient's level of consciousness and vital signs were monitored continuously by radiology nursing throughout the procedure under my direct supervision. COMPLICATIONS: None immediate. PROCEDURE: Informed written consent was obtained from the patient after a thorough discussion of the procedural risks, benefits and alternatives. All questions were addressed. Maximal Sterile Barrier Technique was utilized including caps, mask, sterile gowns, sterile gloves, sterile drape, hand hygiene and skin antiseptic. A timeout was performed prior to the initiation of the procedure. The patient was placed in the prone position. Axial CT imaging was performed. Approximately 3.8 x 3.1 cm high attenuation gas and fluid collection present within the small bowel mesentery and nearly completely encircled by loops of small bowel. This is not approachable percutaneously. More inferiorly, of the fluid collection in the left pelvic cul-de-sac is well visualized. A suitable skin entry site from a left transgluteal approach was selected. The skin was sterilely prepped and draped in the standard fashion using chlorhexidine skin prep. Local anesthesia was attained by infiltration with 1% lidocaine.  Under intermittent CT guidance, an 18 gauge trocar needle was advanced along a parasacral pathway and into the fluid collection. A wire was then coiled in the collection. The skin  tract was dilated to 10 [place]. A 10 French all-purpose drainage catheter was then advanced over the wire and formed. Aspiration yields approximately 10 mL of purulent fluid and debris. The catheter was lavaged and connected to JP bulb suction before being secured to the skin with 0 Prolene suture. IMPRESSION: 1. Contained colonic or small bowel perforation containing previously ingested oral contrast material within the small bowel mesentery is nearly completely surrounded by loops of small bowel and is not approachable percutaneously. 2. Successful placement of a 10 French drainage catheter into the pelvic cul-de-sac fluid collection via a left transgluteal approach. Aspiration yields approximately 10 mL of purulent fluid and debris. Samples were sent for Gram stain and culture. Electronically Signed   By: Jacqulynn Cadet M.D.   On: 04/02/2022 15:35   CT ABDOMEN PELVIS W CONTRAST  Result Date: 03/28/2022 CLINICAL DATA:  Abdominal pain, vomiting EXAM: CT ABDOMEN AND PELVIS WITH CONTRAST TECHNIQUE: Multidetector CT imaging of the abdomen and pelvis was performed using the standard protocol following bolus administration of intravenous contrast. RADIATION DOSE REDUCTION: This exam was performed according to the departmental dose-optimization program which includes automated exposure control, adjustment of the mA and/or kV according to patient size and/or use of iterative reconstruction technique. CONTRAST:  133m OMNIPAQUE IOHEXOL 300 MG/ML  SOLN COMPARISON:  CT done on 03/22/2022, radiographs done earlier today FINDINGS: Lower chest: Small linear densities are seen in both lower lung fields suggesting subsegmental atelectasis. Minimal bilateral pleural effusions are seen, more so on the left side. There is dense calcification in the margins of reconstruction/augmentation prostheses in both breasts. There are scattered coronary artery calcifications. Hepatobiliary: There is fatty infiltration in liver. There  is previous cholecystectomy. Prominence of extrahepatic bile ducts may be due to previous cholecystectomy. Pancreas: No focal abnormalities are seen. Spleen: Unremarkable. Adrenals/Urinary Tract: Adrenals are unremarkable. There is no hydronephrosis. There are no renal or ureteral stones. There is 2.4 cm cyst in the lateral margin of the mid to lower portion of left kidney. There are other tiny subcentimeter low-density foci in both kidneys. Urinary bladder is not distended. Stomach/Bowel: Stomach is not distended. There is moderate dilation of proximal small bowel loops measuring up to 4.2 cm in diameter. Distal small bowel loops are not dilated. Zone transition appears to be in lower mid abdomen at the level of pelvic inlet. Appendix is not distinctly seen. There is wall thickening in the caudal course of descending colon and in sigmoid colon suggesting inflammatory or infectious colitis. Similar finding was seen in the previous study. There is mild wall thickening in rectum. There is interval appearance of few loculated fluid collections adjacent to sigmoid colon. There is 4.5 x 2.9 cm loculated collection immediately superior to the sigmoid colon lying adjacent to a small bowel loop which shows narrowing and wall thickening. There are few other small pockets of air and fluid adjacent to the anterior margin of sigmoid colon measuring up to 2 cm. There is a large loculated fluid collection measuring 9.8 x 5.6 cm in size in the posterior pelvic cavity posteroinferior to the sigmoid colon. There are no pockets of air in this fluid collection. Vascular/Lymphatic: Scattered arterial calcifications are seen. Reproductive: Uterus is difficult to visualize. There are no adnexal masses. Other: There is no pneumoperitoneum in the upper abdomen. There is minimal  amount of ascites in upper abdomen. There are pockets of air in subcutaneous plane in right lower anterior abdominal wall, possibly sites of parenteral  administration of medication. There is subcutaneous stranding, possibly suggesting anasarca. Musculoskeletal: Dextroscoliosis is seen in lumbar spine. Decrease in height of body of L2 vertebra has not changed significantly. Central compression in the body of L5 vertebra has not changed. IMPRESSION: There is abnormal diffuse wall thickening in distal descending colon and sigmoid colon consistent with inflammatory or infectious colitis with no significant interval change. There is interval appearance of few loculated fluid collections containing pockets of air adjacent to the sigmoid colon measuring up to 4.5 cm in diameter suggesting multiple pericolic abscesses. Largest of these abscesses is lying superior to the sigmoid colon immediately adjacent to a distal small bowel loop. There is mucosal thickening and narrowing of the lumen in this small bowel loop suggesting inflammatory or infectious ileitis. Small bowel loops proximal to this inflamed ileum is dilated suggesting partial small bowel obstruction due to acute inflammatory/infectious ileitis. There is loculated fluid collection measuring 9.8 x 5.6 cm in size in lower pelvic cavity with mild wall thickening, possibly suggesting early abscess or seroma in the inferior pelvic cavity anterior to the rectum. There are no pockets of air within this loculated fluid collection. Interval appearance of minimal bilateral pleural effusions. There are linear densities in both lower lung fields suggesting subsegmental atelectasis. Other findings as described in the body of the report. Electronically Signed   By: Elmer Picker M.D.   On: 03/28/2022 16:42   DG Abd Portable 1V  Result Date: 03/28/2022 CLINICAL DATA:  Follow-up ileus. EXAM: PORTABLE ABDOMEN - 1 VIEW COMPARISON:  CT, 03/22/2022. Abdominal radiographs, 03/26/2022 and 03/25/2022. FINDINGS: Dilated central small bowel is without significant change from the most recent prior exam. Scattered air is seen  within a normal caliber colon. No gross free air on this supine exam. IMPRESSION: 1. Persistent central abdominal small bowel dilation without colonic dilation. Findings may reflect a persistent small-bowel adynamic ileus or a partial obstruction. Electronically Signed   By: Lajean Manes M.D.   On: 03/28/2022 11:03   DG Abd 2 Views  Result Date: 03/26/2022 CLINICAL DATA:  Constipation EXAM: ABDOMEN - 2 VIEW COMPARISON:  03/25/2022. FINDINGS: Minimal small bowel dilatation. Stool and air overlies the colon and rectosigmoid. No free air identified. Cholecystectomy clips are noted. No radiopaque stones identified. IMPRESSION: Minimal small bowel dilatation likely representing ileus. No obstruction. No change compared to the prior study. Electronically Signed   By: Sammie Bench M.D.   On: 03/26/2022 09:15   DG Abd 2 Views  Result Date: 03/25/2022 CLINICAL DATA:  Lower abdominal pain EXAM: ABDOMEN - 2 VIEW COMPARISON:  None Available. FINDINGS: Dilated small bowel loops.  Colon decompressed. Surgical clips in the gallbladder fossa. No acute skeletal abnormality. Heavily calcified breast implants bilaterally. IMPRESSION: Dilated small bowel loops consistent with small-bowel obstruction. Progressive small bowel dilatation since the recent CT of 03/22/2022 Electronically Signed   By: Franchot Gallo M.D.   On: 03/25/2022 15:16   DG Chest Port 1 View  Result Date: 03/22/2022 CLINICAL DATA:  Nausea EXAM: PORTABLE CHEST 1 VIEW COMPARISON:  CT 03/22/2022, chest x-ray report 01/15/2014 FINDINGS: Calcified bilateral breast implants. Elevation of the right diaphragm. Linear scarring or atelectasis in the left lower lung. Normal cardiac size. Aortic atherosclerosis. No pneumothorax. IMPRESSION: No active disease. Linear scarring or atelectasis in the left lower lung. Electronically Signed   By: Donavan Foil  M.D.   On: 03/22/2022 15:12   CT Abdomen Pelvis W Contrast  Result Date: 03/22/2022 CLINICAL DATA:   Lower abdominal pain, nausea, and diarrhea beginning yesterday. EXAM: CT ABDOMEN AND PELVIS WITH CONTRAST TECHNIQUE: Multidetector CT imaging of the abdomen and pelvis was performed using the standard protocol following bolus administration of intravenous contrast. RADIATION DOSE REDUCTION: This exam was performed according to the departmental dose-optimization program which includes automated exposure control, adjustment of the mA and/or kV according to patient size and/or use of iterative reconstruction technique. CONTRAST:  174m OMNIPAQUE IOHEXOL 300 MG/ML  SOLN COMPARISON:  None Available. FINDINGS: Lower Chest: No acute findings. Hepatobiliary: No hepatic masses identified. Prior cholecystectomy noted. Mild biliary ductal dilatation noted, likely due to prior cholecystectomy. Pancreas: No mass or inflammatory changes. No evidence of pancreatic ductal dilatation. Spleen: Within normal limits in size and appearance. Adrenals/Urinary Tract: No suspicious masses identified. No evidence of ureteral calculi or hydronephrosis. Stomach/Bowel: Large amount colonic stool is seen. Mild colonic wall thickening pericolonic soft tissue stranding is seen involving the descending and sigmoid colon which is distended by stool, but shows no evidence of diverticular disease. This is consistent with colitis, and differential diagnosis includes infectious etiologies and stercoral colitis. No evidence of abscess. Vascular/Lymphatic: No pathologically enlarged lymph nodes. No acute vascular findings. Aortic atherosclerotic calcification incidentally noted. Reproductive:  No mass or other significant abnormality. Other:  None. Musculoskeletal:  No suspicious bone lesions identified. IMPRESSION: Mild colitis involving the descending and sigmoid colon, with large amount of stool noted. Differential diagnosis includes infectious etiologies and stercoral colitis. Aortic Atherosclerosis (ICD10-I70.0). Electronically Signed   By: JMarlaine HindM.D.   On: 03/22/2022 12:58    Micro Results   Recent Results (from the past 240 hour(s))  Aerobic/Anaerobic Culture w Gram Stain (surgical/deep wound)     Status: None (Preliminary result)   Collection Time: 04/02/22 11:50 AM   Specimen: Abscess  Result Value Ref Range Status   Specimen Description ABSCESS  Final   Special Requests NONE  Final   Gram Stain NO WBC SEEN NO ORGANISMS SEEN   Final   Culture   Final    NO GROWTH 2 DAYS NO ANAEROBES ISOLATED; CULTURE IN PROGRESS FOR 5 DAYS [facility]formed at [facility] [address] [place] [address]   Report Status PENDING  Incomplete    Today   Subjective    JMarlana Mckowentoday has no new complaints  No fever  Or chills   No Nausea, Vomiting or Diarrhea -oral Intake is fair -No significant abdominal pain         Patient has been seen and examined prior to discharge   Objective   Blood pressure 107/63, pulse 73, temperature 98.3 F (36.8 C), temperature source Oral, resp. rate 16, height '5\' 1"'$  (1.549 m), weight 62.8 kg, SpO2 95 %.   Intake/Output Summary (Last 24 hours) at 04/04/2022 1240 Last data filed at 04/04/2022 0200 Gross per 24 hour  Intake 240 ml  Output 15 ml  Net 225 ml    Exam Gen:- Awake Alert, in no acute distress  HEENT:- [place].AT, No sclera icterus Neck-Supple Neck,No JVD,.  Lungs-  CTAB , fair air movement bilaterally  CV- S1, S2 normal, RRR Abd-  +ve B.Sounds, Abd Soft, much improved abdominal discomfort on palpation -Left gluteal area JP drain in situ with scant serosanguineous drainage Extremity/Skin:- No  edema,   good pedal pulses  Psych-affect is appropriate, oriented x3 Neuro-no new focal  deficits, no tremors   Data Review   CBC w Diff:  Lab Results  Component Value Date   WBC 15.2 (H) 04/04/2022   HGB 10.6 (L) 04/04/2022   HCT 32.6 (L) 04/04/2022   PLT 536 (H) 04/04/2022   LYMPHOPCT 13 04/04/2022   MONOPCT 7 04/04/2022   EOSPCT 1 04/04/2022   BASOPCT 1  04/04/2022    CMP:  Lab Results  Component Value Date   NA 135 04/04/2022   K 3.4 (L) 04/04/2022   CL 105 04/04/2022   CO2 25 04/04/2022   BUN 5 (L) 04/04/2022   CREATININE 0.53 04/04/2022   PROT 5.4 (L) 03/23/2022   ALBUMIN 2.8 (L) 03/23/2022   BILITOT 0.7 03/23/2022   ALKPHOS 33 (L) 03/23/2022   AST 15 03/23/2022   ALT 13 03/23/2022  .  Total Discharge time is about 33 minutes  Roxan Hockey M.D on 04/04/2022 at 12:40 PM  Go to [email or web address] -  for contact info  [facility] - Office  [phone]

## 2022-04-04 NOTE — Discharge Instructions (Addendum)
1) please take cefdinir/Omnicef antibiotic along with Flagyl/metronidazole antibiotic as prescribed 2) please follow-up with general surgeon Dr. Aviva Signs in about 5 days or so as advised 3) repeat CBC and BMP blood test on Tuesday, April 07, 2022 advised 4) left buttock area JP drain care as advised--

## 2022-04-04 NOTE — TOC Transition Note (Signed)
Transition of Care [facility]) - CM/SW Discharge Note   Patient Details  Name: JAKIAH BIENAIME MRN: [id number] Date of Birth: 1956-01-09  Transition of Care [facility]) CM/SW Contact:  Boneta Lucks, RN Phone Number: 04/04/2022, 12:13 PM   Clinical Narrative:   Patient discharging home. Family wanting HHRN. MD ordered, Marjory Lies with [place] accepted.    Final next level of care: Home w Home Health Services Barriers to Discharge: Continued Medical Work up   Patient Goals and CMS Choice CMS Medicare.gov Compare Post Acute Care list provided to:: Patient Choice offered to / list presented to : Patient  Discharge Placement    Patient and family notified of of transfer: 04/04/22  Discharge Plan and Services Additional resources added to the After Visit Summary for    [facility] Agency: [place] Date [place]: 04/04/22 Time [place]: 9470 Representative spoke with at [place]: [place] ([place]) Interventions [place]: No Food Insecurity (03/23/2022)  Housing: Low Risk  (03/23/2022)  Transportation Needs: No Transportation Needs (03/23/2022)  Utilities: Not At Risk (03/23/2022)  Tobacco Use: Low Risk  (04/02/2022)    Readmission Risk Interventions    03/24/2022   11:40 AM  Readmission Risk Prevention Plan  Post Dischage Appt Not Complete  Medication Screening Complete  Transportation Screening Not Complete

## 2022-04-07 ENCOUNTER — Other Ambulatory Visit: Payer: Self-pay

## 2022-04-07 ENCOUNTER — Encounter: Payer: Self-pay | Admitting: General Surgery

## 2022-04-07 ENCOUNTER — Encounter: Payer: Self-pay | Admitting: *Deleted

## 2022-04-07 ENCOUNTER — Ambulatory Visit (INDEPENDENT_AMBULATORY_CARE_PROVIDER_SITE_OTHER): Payer: PPO | Admitting: General Surgery

## 2022-04-07 VITALS — BP 120/72 | HR 78 | Temp 98.5°F | Resp 18 | Ht 61.0 in | Wt 130.0 lb

## 2022-04-07 DIAGNOSIS — K529 Noninfective gastroenteritis and colitis, unspecified: Secondary | ICD-10-CM | POA: Diagnosis not present

## 2022-04-07 DIAGNOSIS — Z09 Encounter for follow-up examination after completed treatment for conditions other than malignant neoplasm: Secondary | ICD-10-CM

## 2022-04-07 MED ORDER — FLUCONAZOLE 100 MG PO TABS: 100.0000 mg | ORAL_TABLET | Freq: Every day | ORAL | 0 refills | Status: DC

## 2022-04-07 NOTE — Progress Notes (Signed)
Subjective:     Joanna Sutton  Patient here for hospital follow-up, status post treatment for colitis.  She did not require surgery.  She did have percutaneous drainage of a large pelvic fluid collection that ended up being just peritoneal fluid.  Cultures and Gram stain were all negative.  She has had minimal drainage from that.  Her abdominal pain has decreased.  She is having normal bowel movements.  She is taking antibiotics until Thursday.  She has developed a yeast infection in the perineum. Objective:    BP 120/72   Pulse 78   Temp 98.5 F (36.9 C) (Oral)   Resp 18   Ht _0  (1.549 m)   Wt 130 lb (59 kg)   SpO2 94%   BMI 24.56 kg/m   General:  alert, cooperative, and no distress  Abdomen soft with minimal tenderness noted.  No rigidity is noted.  Percutaneous drain removed.     Assessment:    Sigmoid colitis/diverticulitis, resolving.  Still is not at 100%.    Plan:   Will get CBC and be met today.  As she is still recovering, I advised her not to go on a cruise that is to start on April 17, 2022.  She has a follow-up appointment with gastroenterology in 2 weeks.  Will call her with the results of her labs.  Diflucan 100 mg p.o. daily x 5 days has been ordered.

## 2022-04-08 LAB — CBC WITH DIFFERENTIAL/PLATELET
Basophils Absolute: 0.1 10*3/uL (ref 0.0–0.2)
Basos: 1 %
EOS (ABSOLUTE): 0.1 10*3/uL (ref 0.0–0.4)
Eos: 2 %
Hematocrit: 34.4 % (ref 34.0–46.6)
Hemoglobin: 11.6 g/dL (ref 11.1–15.9)
Immature Grans (Abs): 0.1 10*3/uL (ref 0.0–0.1)
Immature Granulocytes: 1 %
Lymphocytes Absolute: 2.3 10*3/uL (ref 0.7–3.1)
Lymphs: 25 %
MCH: 30.4 pg (ref 26.6–33.0)
MCHC: 33.7 g/dL (ref 31.5–35.7)
MCV: 90 fL (ref 79–97)
Monocytes Absolute: 0.9 10*3/uL (ref 0.1–0.9)
Monocytes: 9 %
Neutrophils Absolute: 5.9 10*3/uL (ref 1.4–7.0)
Neutrophils: 62 %
Platelets: 563 10*3/uL — ABNORMAL HIGH (ref 150–450)
RBC: 3.81 x10E6/uL (ref 3.77–5.28)
RDW: 12.5 % (ref 11.7–15.4)
WBC: 9.3 10*3/uL (ref 3.4–10.8)

## 2022-04-08 LAB — BASIC METABOLIC PANEL
BUN/Creatinine Ratio: 11 — ABNORMAL LOW (ref 12–28)
BUN: 7 mg/dL — ABNORMAL LOW (ref 8–27)
CO2: 24 mmol/L (ref 20–29)
Calcium: 9.5 mg/dL (ref 8.7–10.3)
Chloride: 99 mmol/L (ref 96–106)
Creatinine, Ser: 0.62 mg/dL (ref 0.57–1.00)
Glucose: 99 mg/dL (ref 70–99)
Potassium: 4.6 mmol/L (ref 3.5–5.2)
Sodium: 136 mmol/L (ref 134–144)
eGFR: 97 mL/min/{1.73_m2} (ref >59)

## 2022-04-08 LAB — AEROBIC/ANAEROBIC CULTURE W GRAM STAIN (SURGICAL/DEEP WOUND)
Culture: NO GROWTH
Gram Stain: NONE SEEN

## 2022-04-09 ENCOUNTER — Ambulatory Visit: Payer: PPO | Admitting: General Surgery

## 2022-04-10 DIAGNOSIS — K5780 Diverticulitis of intestine, part unspecified, with perforation and abscess without bleeding: Secondary | ICD-10-CM | POA: Diagnosis not present

## 2022-04-10 DIAGNOSIS — Z09 Encounter for follow-up examination after completed treatment for conditions other than malignant neoplasm: Secondary | ICD-10-CM | POA: Diagnosis not present

## 2022-04-10 DIAGNOSIS — I10 Essential (primary) hypertension: Secondary | ICD-10-CM | POA: Diagnosis not present

## 2022-04-10 DIAGNOSIS — Z299 Encounter for prophylactic measures, unspecified: Secondary | ICD-10-CM | POA: Diagnosis not present

## 2022-04-19 NOTE — Progress Notes (Unsigned)
GI Office Note    Referring Provider: Glenda Chroman, MD Primary Care Physician:  Glenda Chroman, MD Primary Gastroenterologist: Harvel Quale, MD   Date:  04/20/2022  ID:  Joanna Sutton, DOB 1955/12/03, MRN [id number]   Chief Complaint   Chief Complaint  Patient presents with   [facility] follow up    History of Present Illness  Joanna Sutton is a 66 y.o. female with a history of GERD, dysphagia, depression, HLD, HTN, esophageal and prepyloric ulcers, diarrhea presenting today for hospital follow up.  Seen in August 2023 for positive cologuard. Having intermittent toilet tissue hematochezia twice/month. Straining at times for BM. Also with dysphagia and occasional regurgitation of food 2-3/month. EGD and colonoscopy recommended and NSAID cessation.  Colonoscopy 01/13/22: -15-15m polyp ascending colon (sessile serrated) -3 polyps in transverse and ascending colon s/p tattoo (tubular adenomas) -sigmoid and ascending diverticulosis.  -non bleeding internal hemorrhoids -Repeat TCS in April 2024  EGD 01/13/22: -white nummular lesions in esophagus (biopsy with mild chronic esophagitis) -1cm hiatal hernia -Non obstructing shatzki ring s/p dilation -Hill grade IV GE falp -non bleeding gastric ulcer with clean base, normal biopsy -normal duodenum.   Last seen in November 2023 by CScherrie Gerlach NP. Noted to have resolution of dysphagia post EGD. Stopped BC powders but began taking ibuprofen three times week for headache. Taking pantoprazole 40 mg daily. Denied GERD symptoms. Advised to avoid NSAIDs. Orders given for TCS in April 2024. Follow up in 1 year.   Recent hospitalization for abdominal pain and diarrhea 03/22/22-04/04/22. Stool studies negative. Had evidence of colitis on CT 03/22/22. Due to worsening pain she had repeat CT 12/23 showing persistent diffuse wall thickening in the distal descending colon and sigmoid and new findings suggestive of  multiple percolic abscesses (largest 4.5cm) superior to sigmoid and adjacent to small bowel loop. Also noted mucosal thickening and narrowing of small bowel lumen suggesting ileitis with proximal dilated small bowel loops. Also concern for abscess in pelvic cavity. WBC increased and H/H stable. [facility]. Plan to follow with general surgery post discharge.   04/07/22 - F/u with general surgery Dr. JArnoldo Morale Evaluation of fluid from IR drain revealed to be peritoneal fluid. Cultures negative. Having normal Bms, abd pain improved. Due to stop antibiotics a couple days later. Yeast infection post antibiotics therapy. Labs ordered. Advised to f/u with GI in 2 weeks. Diflucan given.   Labs 04/07/22: WBC 9.3, Hgb 11.6, Cr. 0.62, BUN 7  Today: Has been doing okay since discharge. Stayed with her brother for 1 week after hospital and had drain removed at her follow up with Dr. JArnoldo Morale   Watching what she eats right now. Followed up with PCP after hospitalization and advised her to not eat nuts, seeds, or popcorn. Has had a BM everyday. Drank miralax TID in the hospital. Having looser stools and taking miralax twice per day. Having to wear depends due to having a couple accidents due to urgency. No melena or brbpr. Has an okay appetite. Maintaining weight and having decent energy, slowing getting it back. No more abdominal pain.   Missed her cruise that she was supposed to go on this month.    Current Outpatient Medications  Medication Sig Dispense Refill   acetaminophen (TYLENOL) 500 MG tablet Take 500 mg by mouth every 6 (six) hours as needed for pain.     atorvastatin (LIPITOR) 40  MG tablet Take 40 mg by mouth daily.     cetirizine (ZYRTEC) 10 MG tablet Take 10 mg by mouth daily.     cyanocobalamin (VITAMIN B12) 1000 MCG tablet Take 2,000 mcg by mouth daily.     escitalopram (LEXAPRO) 10 MG tablet Take 10  mg by mouth daily.     lisinopril (ZESTRIL) 10 MG tablet Take 10-20 mg by mouth daily.     pantoprazole (PROTONIX) 40 MG tablet Take 1 tablet (40 mg total) by mouth 2 (two) times daily before a meal. (Patient taking differently: Take 40 mg by mouth daily.) 60 tablet 3   polyethylene glycol (MIRALAX) 17 g packet Take 17 g by mouth daily. 14 each 0   No current facility-administered medications for this visit.    Past Medical History:  Diagnosis Date   Depression    Diarrhea    GERD (gastroesophageal reflux disease)    High cholesterol    Hypertension     Past Surgical History:  Procedure Laterality Date   APPENDECTOMY     AUGMENTATION MAMMAPLASTY     BIOPSY  01/13/2022   Procedure: BIOPSY;  Surgeon: Harvel Quale, MD;  [facility];  Service: Gastroenterology;;   BREAST ENHANCEMENT SURGERY     CESAREAN SECTION     CHOLECYSTECTOMY     COLONOSCOPY WITH PROPOFOL N/A 01/13/2022   Procedure: COLONOSCOPY WITH PROPOFOL;  Surgeon: Harvel Quale, MD;  [facility];  Service: Gastroenterology;  Laterality: N/A;  1030 am ASA 2   ESOPHAGEAL DILATION N/A 09/27/2014   Procedure: ESOPHAGEAL DILATION;  Surgeon: Rogene Houston, MD;  [facility];  Service: Endoscopy;  Laterality: N/A;   ESOPHAGOGASTRODUODENOSCOPY N/A 09/27/2014   Procedure: ESOPHAGOGASTRODUODENOSCOPY (EGD);  Surgeon: Rogene Houston, MD;  [facility];  Service: Endoscopy;  Laterality: N/A;  930   ESOPHAGOGASTRODUODENOSCOPY N/A 01/03/2015   Procedure: ESOPHAGOGASTRODUODENOSCOPY (EGD);  Surgeon: Rogene Houston, MD;  [facility];  Service: Endoscopy;  Laterality: N/A;  300   ESOPHAGOGASTRODUODENOSCOPY (EGD) WITH PROPOFOL N/A 01/13/2022   Procedure: ESOPHAGOGASTRODUODENOSCOPY (EGD) WITH PROPOFOL;  Surgeon: Harvel Quale, MD;  [facility];  Service: Gastroenterology;  Laterality: N/A;   NASAL SINUS SURGERY     PATELLA FRACTURE  SURGERY Left    around 2016   POLYPECTOMY  01/13/2022   Procedure: POLYPECTOMY;  Surgeon: Harvel Quale, MD;  [facility];  Service: Gastroenterology;;   SUBMUCOSAL LIFTING INJECTION  01/13/2022   Procedure: SUBMUCOSAL LIFTING INJECTION;  Surgeon: Harvel Quale, MD;  [facility];  Service: Gastroenterology;;   SUBMUCOSAL TATTOO INJECTION  01/13/2022   Procedure: SUBMUCOSAL TATTOO INJECTION;  Surgeon: Montez Morita, Quillian Quince, MD;  [facility];  Service: Gastroenterology;;    Family History  Problem Relation Age of Onset   Leukemia Father    Cirrhosis Brother        non alcholic cirrhosis, fatty liver    Allergies as of 04/20/2022 - Review Complete 04/20/2022  Allergen Reaction Noted   Bactrim [sulfamethoxazole-trimethoprim] Hives 11/18/2021   Levaquin [levofloxacin] Nausea And Vomiting 09/24/2014   Penicillins Rash 11/29/2012    Social History   Socioeconomic History   Marital status: Divorced    Spouse name: Not on file   Number of children: Not on file   Years of education: Not on file   Highest education level: Not on file  Occupational History   Not on file  Tobacco Use  Smoking status: Never    Passive exposure: Never   Smokeless tobacco: Never  Vaping Use   Vaping Use: Never used  Substance and Sexual Activity   Alcohol use: No   Drug use: No   Sexual activity: Never  Other Topics Concern   Not on file  Social History Narrative   Not on file   Social Determinants of Health   Financial Resource Strain: Not on file  Food Insecurity: No Food Insecurity (03/23/2022)   Hunger Vital Sign    Worried About Running Out of Food in the Last Year: Never true    Ran Out of Food in the Last Year: Never true  Transportation Needs: No Transportation Needs (03/23/2022)   PRAPARE - Hydrologist (Medical): No    Lack of Transportation (Non-Medical): No  Physical Activity: Not on  file  Stress: Not on file  Social Connections: Not on file     Review of Systems   Gen: Denies fever, chills, anorexia. Denies fatigue, weakness, weight loss.  CV: Denies chest pain, palpitations, syncope, peripheral edema, and claudication. Resp: Denies dyspnea at rest, cough, wheezing, coughing up blood, and pleurisy. GI: See HPI Derm: Denies rash, itching, dry skin Psych: Denies depression, anxiety, memory loss, confusion. No homicidal or suicidal ideation.  Heme: Denies bruising, bleeding, and enlarged lymph nodes.   Physical Exam   BP 126/78 (BP Location: Right Arm, Patient Position: Sitting, Cuff Size: Normal)   Pulse 80   Temp 97.9 F (36.6 C) (Temporal)   Ht '5\' 1"'$  (1.549 m)   Wt 128 lb 9.6 oz (58.3 kg)   SpO2 98%   BMI 24.30 kg/m   General:   Alert and oriented. No distress noted. Pleasant and cooperative.  Head:  Normocephalic and atraumatic. Eyes:  Conjuctiva clear without scleral icterus. Mouth:  Oral mucosa pink and moist. Good dentition. No lesions. Lungs:  Clear to auscultation bilaterally. No wheezes, rales, or rhonchi. No distress.  Heart:  S1, S2 present without murmurs appreciated.  Abdomen:  +BS, soft, non-tender and non-distended. No rebound or guarding. No HSM or masses noted. Rectal: deferred Msk:  Symmetrical without gross deformities. Normal posture. Extremities:  Without edema. Neurologic:  Alert and  oriented x4 Psych:  Alert and cooperative. Normal mood and affect.   Assessment  Joanna Sutton is a 67 y.o. female with a history of  GERD, dysphagia, depression, HLD, HTN, esophageal and prepyloric ulcers, diarrhea presenting today for hospital follow up  History of Colitis: Hospital course outlined in HPI. Received IR drainage after CT evidence during hospitalization for worsening abdominal pain revealed fluid collection concerning for abscess and mild leukocytosis also present. Underwent IR drainage with negative cultures. Has followed up with  general surgery since discharge. Last TCS October 2023.  Currently due for repeat in April 2024 as stated below.  Did well after discharge, staying with her brother for 1 week prior to returning home.  Has started to get her energy back as well as her appetite.  Denies any more abdominal pain.  Continues to have some looser stools however is taking MiraLAX twice daily.  Has not been avoiding food with nuts, seeds, and popcorn.  Plan to proceed with colonoscopy as stated below given her recent colitis and abdominal fluid collection.  Positive Cologuard, history of colon polyps: Previous plan for colonoscopy in April 2024 given positive Cologuard in the large sessile serrated polyp with piecemeal removal in October 2023.  Will proceed with original  plan for colonoscopy in April given this and recent colitis.  Constipation: Currently having daily looser stools, not watery with taking MiraLAX twice per day.  Was having significant looser stools with 3 times daily dosing.  Advised her she may continue twice daily dosing, will reduce to once daily and monitor for any worsening constipation.  GERD: Controlled with pantoprazole 40 mg daily.   PLAN   Continue with plan for repeat colonoscopy in the near future with Dr. Jenetta Downer in April.  I have discussed the risks, alternatives, benefits with regards to but not limited to the risk of reaction to medication, bleeding, infection, perforation and the patient is agreeable to proceed. Written consent to be obtained. ASA 2 Continue Miralax 17g once to twice daily.  Follow up 2 months post procedure.     Venetia Night, MSN, FNP-BC, AGACNP-BC [facility]  I have reviewed the note and agree with the APP's assessment as described in this progress note  Maylon Peppers, MD Gastroenterology and [facility]

## 2022-04-20 ENCOUNTER — Ambulatory Visit (INDEPENDENT_AMBULATORY_CARE_PROVIDER_SITE_OTHER): Payer: PPO | Admitting: Gastroenterology

## 2022-04-20 ENCOUNTER — Encounter: Payer: Self-pay | Admitting: Gastroenterology

## 2022-04-20 VITALS — BP 126/78 | HR 80 | Temp 97.9°F | Ht 61.0 in | Wt 128.6 lb

## 2022-04-20 DIAGNOSIS — K529 Noninfective gastroenteritis and colitis, unspecified: Secondary | ICD-10-CM | POA: Diagnosis not present

## 2022-04-20 DIAGNOSIS — R195 Other fecal abnormalities: Secondary | ICD-10-CM | POA: Diagnosis not present

## 2022-04-20 DIAGNOSIS — K5900 Constipation, unspecified: Secondary | ICD-10-CM

## 2022-04-20 NOTE — Patient Instructions (Addendum)
You may continue taking your MiraLAX.  May continue to take 1 capful once or twice daily depending on your stool consistency.  If you decrease to once daily and begin having less frequent bowel movements, you may go back to increasing to twice daily.  We will plan to do your colonoscopy in April 2024 as previously planned given your positive Cologuard and your recent hospitalization for colitis.  If you have any new medical issues arise or new medication started in that time.  Please let me know.  It was a pleasure to see you today. I want to create trusting relationships with patients. If you receive a survey regarding your visit,  I greatly appreciate you taking time to fill this out on paper or through your MyChart. I value your feedback.  Venetia Night, MSN, FNP-BC, AGACNP-BC [facility]

## 2022-04-21 ENCOUNTER — Telehealth (INDEPENDENT_AMBULATORY_CARE_PROVIDER_SITE_OTHER): Payer: Self-pay | Admitting: *Deleted

## 2022-04-21 NOTE — Telephone Encounter (Signed)
LMOVM to call back to schedule TCS with Dr. Jenetta Downer, ASA 2

## 2022-04-21 NOTE — Telephone Encounter (Signed)
Pt is waiting until April to schedule her colonoscopy. She says she can't do 4/13 nor  she can't do 4/18-4/28. Advised pt will call back once we get providers schedule for April.

## 2022-04-27 ENCOUNTER — Ambulatory Visit (INDEPENDENT_AMBULATORY_CARE_PROVIDER_SITE_OTHER): Payer: PPO | Admitting: Gastroenterology

## 2022-05-04 DIAGNOSIS — H25812 Combined forms of age-related cataract, left eye: Secondary | ICD-10-CM | POA: Diagnosis not present

## 2022-05-04 DIAGNOSIS — I10 Essential (primary) hypertension: Secondary | ICD-10-CM | POA: Diagnosis not present

## 2022-05-04 DIAGNOSIS — H25811 Combined forms of age-related cataract, right eye: Secondary | ICD-10-CM | POA: Diagnosis not present

## 2022-05-21 DIAGNOSIS — H25811 Combined forms of age-related cataract, right eye: Secondary | ICD-10-CM | POA: Diagnosis not present

## 2022-05-21 DIAGNOSIS — H269 Unspecified cataract: Secondary | ICD-10-CM | POA: Diagnosis not present

## 2022-06-03 DIAGNOSIS — I10 Essential (primary) hypertension: Secondary | ICD-10-CM | POA: Diagnosis not present

## 2022-06-05 DIAGNOSIS — I10 Essential (primary) hypertension: Secondary | ICD-10-CM | POA: Diagnosis not present

## 2022-06-05 DIAGNOSIS — K5792 Diverticulitis of intestine, part unspecified, without perforation or abscess without bleeding: Secondary | ICD-10-CM | POA: Diagnosis not present

## 2022-06-05 DIAGNOSIS — K5780 Diverticulitis of intestine, part unspecified, with perforation and abscess without bleeding: Secondary | ICD-10-CM | POA: Diagnosis not present

## 2022-06-05 DIAGNOSIS — Z299 Encounter for prophylactic measures, unspecified: Secondary | ICD-10-CM | POA: Diagnosis not present

## 2022-06-05 DIAGNOSIS — F339 Major depressive disorder, recurrent, unspecified: Secondary | ICD-10-CM | POA: Diagnosis not present

## 2022-06-05 DIAGNOSIS — K838 Other specified diseases of biliary tract: Secondary | ICD-10-CM | POA: Diagnosis not present

## 2022-06-05 DIAGNOSIS — N281 Cyst of kidney, acquired: Secondary | ICD-10-CM | POA: Diagnosis not present

## 2022-06-07 ENCOUNTER — Emergency Department (HOSPITAL_COMMUNITY): Payer: PPO

## 2022-06-07 ENCOUNTER — Other Ambulatory Visit: Payer: Self-pay

## 2022-06-07 ENCOUNTER — Inpatient Hospital Stay (HOSPITAL_COMMUNITY)
Admission: EM | Admit: 2022-06-07 | Discharge: 2022-06-10 | DRG: 392 | Disposition: A | Discharge status: Home / Self Care | Payer: PPO | Attending: Internal Medicine | Admitting: Internal Medicine

## 2022-06-07 ENCOUNTER — Encounter (HOSPITAL_COMMUNITY): Payer: Self-pay

## 2022-06-07 DIAGNOSIS — R1032 Left lower quadrant pain: Secondary | ICD-10-CM | POA: Diagnosis present

## 2022-06-07 DIAGNOSIS — M549 Dorsalgia, unspecified: Secondary | ICD-10-CM | POA: Diagnosis present

## 2022-06-07 DIAGNOSIS — Z79899 Other long term (current) drug therapy: Secondary | ICD-10-CM

## 2022-06-07 DIAGNOSIS — K529 Noninfective gastroenteritis and colitis, unspecified: Principal | ICD-10-CM

## 2022-06-07 DIAGNOSIS — R109 Unspecified abdominal pain: Secondary | ICD-10-CM | POA: Diagnosis not present

## 2022-06-07 DIAGNOSIS — E876 Hypokalemia: Secondary | ICD-10-CM | POA: Diagnosis not present

## 2022-06-07 DIAGNOSIS — Z8711 Personal history of peptic ulcer disease: Secondary | ICD-10-CM

## 2022-06-07 DIAGNOSIS — E7800 Pure hypercholesterolemia, unspecified: Secondary | ICD-10-CM | POA: Diagnosis present

## 2022-06-07 DIAGNOSIS — G8929 Other chronic pain: Secondary | ICD-10-CM | POA: Diagnosis present

## 2022-06-07 DIAGNOSIS — Z1152 Encounter for screening for COVID-19: Secondary | ICD-10-CM

## 2022-06-07 DIAGNOSIS — Z881 Allergy status to other antibiotic agents status: Secondary | ICD-10-CM | POA: Diagnosis not present

## 2022-06-07 DIAGNOSIS — K2100 Gastro-esophageal reflux disease with esophagitis, without bleeding: Secondary | ICD-10-CM | POA: Diagnosis present

## 2022-06-07 DIAGNOSIS — F32A Depression, unspecified: Secondary | ICD-10-CM | POA: Diagnosis present

## 2022-06-07 DIAGNOSIS — R197 Diarrhea, unspecified: Secondary | ICD-10-CM | POA: Diagnosis not present

## 2022-06-07 DIAGNOSIS — Z880 Allergy status to penicillin: Secondary | ICD-10-CM | POA: Diagnosis not present

## 2022-06-07 DIAGNOSIS — K5100 Ulcerative (chronic) pancolitis without complications: Secondary | ICD-10-CM | POA: Diagnosis not present

## 2022-06-07 DIAGNOSIS — D649 Anemia, unspecified: Secondary | ICD-10-CM | POA: Diagnosis not present

## 2022-06-07 DIAGNOSIS — K5289 Other specified noninfective gastroenteritis and colitis: Secondary | ICD-10-CM | POA: Diagnosis not present

## 2022-06-07 DIAGNOSIS — E860 Dehydration: Secondary | ICD-10-CM | POA: Diagnosis not present

## 2022-06-07 DIAGNOSIS — Z9049 Acquired absence of other specified parts of digestive tract: Secondary | ICD-10-CM | POA: Diagnosis not present

## 2022-06-07 DIAGNOSIS — N179 Acute kidney failure, unspecified: Secondary | ICD-10-CM | POA: Diagnosis present

## 2022-06-07 DIAGNOSIS — I700 Atherosclerosis of aorta: Secondary | ICD-10-CM | POA: Diagnosis not present

## 2022-06-07 DIAGNOSIS — I10 Essential (primary) hypertension: Secondary | ICD-10-CM | POA: Diagnosis not present

## 2022-06-07 DIAGNOSIS — I959 Hypotension, unspecified: Secondary | ICD-10-CM | POA: Diagnosis not present

## 2022-06-07 HISTORY — DX: Diverticulitis of intestine, part unspecified, without perforation or abscess without bleeding: K57.92

## 2022-06-07 LAB — CBC WITH DIFFERENTIAL/PLATELET
Abs Immature Granulocytes: 0.03 10*3/uL (ref 0.00–0.07)
Basophils Absolute: 0.1 10*3/uL (ref 0.0–0.1)
Basophils Relative: 1 %
Eosinophils Absolute: 0 10*3/uL (ref 0.0–0.5)
Eosinophils Relative: 0 %
HCT: 38.6 % (ref 36.0–46.0)
Hemoglobin: 12.7 g/dL (ref 12.0–15.0)
Immature Granulocytes: 0 %
Lymphocytes Relative: 7 %
Lymphs Abs: 0.7 10*3/uL (ref 0.7–4.0)
MCH: 30.5 pg (ref 26.0–34.0)
MCHC: 32.9 g/dL (ref 30.0–36.0)
MCV: 92.6 fL (ref 80.0–100.0)
Monocytes Absolute: 0.7 10*3/uL (ref 0.1–1.0)
Monocytes Relative: 7 %
Neutro Abs: 8.6 10*3/uL — ABNORMAL HIGH (ref 1.7–7.7)
Neutrophils Relative %: 85 %
Platelets: 242 10*3/uL (ref 150–400)
RBC: 4.17 MIL/uL (ref 3.87–5.11)
RDW: 13.6 % (ref 11.5–15.5)
WBC: 10 10*3/uL (ref 4.0–10.5)
nRBC: 0 % (ref 0.0–0.2)

## 2022-06-07 LAB — COMPREHENSIVE METABOLIC PANEL
ALT: 12 U/L (ref 0–44)
AST: 15 U/L (ref 15–41)
Albumin: 3.3 g/dL — ABNORMAL LOW (ref 3.5–5.0)
Alkaline Phosphatase: 47 U/L (ref 38–126)
Anion gap: 15 (ref 5–15)
BUN: 54 mg/dL — ABNORMAL HIGH (ref 8–23)
CO2: 18 mmol/L — ABNORMAL LOW (ref 22–32)
Calcium: 8.7 mg/dL — ABNORMAL LOW (ref 8.9–10.3)
Chloride: 98 mmol/L (ref 98–111)
Creatinine, Ser: 3.76 mg/dL — ABNORMAL HIGH (ref 0.44–1.00)
GFR, Estimated: 13 mL/min — ABNORMAL LOW (ref >60)
Glucose, Bld: 112 mg/dL — ABNORMAL HIGH (ref 70–99)
Potassium: 3.2 mmol/L — ABNORMAL LOW (ref 3.5–5.1)
Sodium: 131 mmol/L — ABNORMAL LOW (ref 135–145)
Total Bilirubin: 0.5 mg/dL (ref 0.3–1.2)
Total Protein: 6.8 g/dL (ref 6.5–8.1)

## 2022-06-07 LAB — LACTIC ACID, PLASMA
Lactic Acid, Venous: 1.5 mmol/L (ref 0.5–1.9)
Lactic Acid, Venous: 1.8 mmol/L (ref 0.5–1.9)

## 2022-06-07 LAB — RESP PANEL BY RT-PCR (RSV, FLU A&B, COVID)  RVPGX2
Influenza A by PCR: NEGATIVE
Influenza B by PCR: NEGATIVE
Resp Syncytial Virus by PCR: NEGATIVE
SARS Coronavirus 2 by RT PCR: NEGATIVE

## 2022-06-07 LAB — SEDIMENTATION RATE: Sed Rate: 55 mm/hr — ABNORMAL HIGH (ref 0–22)

## 2022-06-07 LAB — C DIFFICILE QUICK SCREEN W PCR REFLEX
C Diff antigen: NEGATIVE
C Diff interpretation: NOT DETECTED
C Diff toxin: NEGATIVE

## 2022-06-07 LAB — C-REACTIVE PROTEIN: CRP: 36.6 mg/dL — ABNORMAL HIGH (ref <1.0)

## 2022-06-07 LAB — MAGNESIUM: Magnesium: 2.2 mg/dL (ref 1.7–2.4)

## 2022-06-07 LAB — LIPASE, BLOOD: Lipase: 28 U/L (ref 11–51)

## 2022-06-07 MED ORDER — METRONIDAZOLE 500 MG/100ML IV SOLN
500.0000 mg | Freq: Two times a day (BID) | INTRAVENOUS | Status: DC
  Administered 2022-06-07: 500 mg via INTRAVENOUS
  Filled 2022-06-07: qty 100

## 2022-06-07 MED ORDER — LACTATED RINGERS IV BOLUS
1000.0000 mL | Freq: Once | INTRAVENOUS | Status: AC
  Administered 2022-06-07: 1000 mL via INTRAVENOUS

## 2022-06-07 MED ORDER — SODIUM CHLORIDE 0.9 % IV SOLN
6.2500 mg | Freq: Four times a day (QID) | INTRAVENOUS | Status: DC | PRN
  Administered 2022-06-07: 6.25 mg via INTRAVENOUS
  Filled 2022-06-07: qty 0.25

## 2022-06-07 MED ORDER — POTASSIUM CHLORIDE 10 MEQ/100ML IV SOLN
10.0000 meq | INTRAVENOUS | Status: AC
  Administered 2022-06-07 (×2): 10 meq via INTRAVENOUS
  Filled 2022-06-07 (×2): qty 100

## 2022-06-07 MED ORDER — PROMETHAZINE HCL 25 MG/ML IJ SOLN
INTRAMUSCULAR | Status: AC
  Filled 2022-06-07: qty 1

## 2022-06-07 MED ORDER — ACETAMINOPHEN 325 MG PO TABS
650.0000 mg | ORAL_TABLET | Freq: Four times a day (QID) | ORAL | Status: DC | PRN
  Administered 2022-06-08: 650 mg via ORAL
  Filled 2022-06-07: qty 2

## 2022-06-07 MED ORDER — MORPHINE SULFATE (PF) 2 MG/ML IV SOLN
2.0000 mg | INTRAVENOUS | Status: DC | PRN
  Administered 2022-06-07 – 2022-06-10 (×4): 2 mg via INTRAVENOUS
  Filled 2022-06-07 (×4): qty 1

## 2022-06-07 MED ORDER — ACETAMINOPHEN 650 MG RE SUPP: 650.0000 mg | Freq: Four times a day (QID) | RECTAL | Status: DC | PRN

## 2022-06-07 MED ORDER — SODIUM CHLORIDE 0.9 % IV SOLN
500.0000 mg | Freq: Two times a day (BID) | INTRAVENOUS | Status: DC
  Administered 2022-06-07: 500 mg via INTRAVENOUS
  Filled 2022-06-07 (×5): qty 10

## 2022-06-07 MED ORDER — ONDANSETRON HCL 4 MG/2ML IJ SOLN
4.0000 mg | Freq: Once | INTRAMUSCULAR | Status: AC
  Administered 2022-06-07: 4 mg via INTRAVENOUS
  Filled 2022-06-07: qty 2

## 2022-06-07 MED ORDER — POTASSIUM CHLORIDE IN NACL 40-0.9 MEQ/L-% IV SOLN: INTRAVENOUS | Status: DC

## 2022-06-07 MED ORDER — SODIUM CHLORIDE 0.9 % IV SOLN
2.0000 g | Freq: Once | INTRAVENOUS | Status: AC
  Administered 2022-06-07: 2 g via INTRAVENOUS
  Filled 2022-06-07: qty 20

## 2022-06-07 MED ORDER — HEPARIN SODIUM (PORCINE) 5000 UNIT/ML IJ SOLN
5000.0000 [IU] | Freq: Three times a day (TID) | INTRAMUSCULAR | Status: DC
  Administered 2022-06-07 – 2022-06-10 (×9): 5000 [IU] via SUBCUTANEOUS
  Filled 2022-06-07 (×9): qty 1

## 2022-06-07 MED ORDER — PROMETHAZINE HCL 25 MG/ML IJ SOLN: 6.2500 mg | Freq: Three times a day (TID) | INTRAMUSCULAR | Status: DC | PRN

## 2022-06-07 MED ORDER — FENTANYL CITRATE PF 50 MCG/ML IJ SOSY
50.0000 ug | PREFILLED_SYRINGE | Freq: Once | INTRAMUSCULAR | Status: AC
  Administered 2022-06-07: 50 ug via INTRAVENOUS
  Filled 2022-06-07: qty 1

## 2022-06-07 MED ORDER — POTASSIUM CHLORIDE IN NACL 40-0.9 MEQ/L-% IV SOLN: INTRAVENOUS | Status: AC

## 2022-06-07 NOTE — Assessment & Plan Note (Signed)
Potassium 3.2. >>  4.0, 4.0 Mag  2.2. QTC prolonged-529. - Replete k.

## 2022-06-07 NOTE — Assessment & Plan Note (Addendum)
Initial was hypotension , systolic down to 86.  Likely from dehydration. -Stabilizing -Hold lisinopril 10 mg

## 2022-06-07 NOTE — Progress Notes (Signed)
Pharmacy Antibiotic Note  Joanna Sutton is a 67 y.o. female admitted on 06/07/2022 with  intraabdominal infection .  Pharmacy has been consulted for Meropenem dosing. AKI with Crcl of 12 ml/min.    Plan: Meropenem 500 mg IV q12hr Monitor renal function closely, as well as, clinical status and C&S.  Height: '5\' 1"'$  (154.9 cm) Weight: 59 kg (130 lb) IBW/kg (Calculated) : 47.8  Temp (24hrs), Avg:98 F (36.7 C), Min:98 F (36.7 C), Max:98 F (36.7 C)  Recent Labs  Lab 06/07/22 1540 06/07/22 1737  WBC 10.0  --   CREATININE 3.76*  --   LATICACIDVEN 1.5 1.8    Estimated Creatinine Clearance: 11.8 mL/min (A) (by C-G formula based on SCr of 3.76 mg/dL (H)).    Allergies  Allergen Reactions   Bactrim [Sulfamethoxazole-Trimethoprim] Hives   Levaquin [Levofloxacin] Nausea And Vomiting   Penicillins Rash    Thank you for allowing pharmacy to be a part of this patient's care.  Alanda Slim, PharmD, [facility] Clinical Pharmacist Please see AMION for all Pharmacists' Contact Phone Numbers 06/07/2022, 9:11 PM

## 2022-06-07 NOTE — Assessment & Plan Note (Addendum)
Presenting with multiple episodes of vomiting and diarrhea x 3 days.  Has received just about 2 days of oral antibiotics. CT showing progression of pancolitis with focally contained microperforation.  No drainable fluid collection or abscess.  Likely ileus. - In December hospitalized also for colitis and abscess.  WBC uptrending on empiric antibiotics, hence was switched to meropenem, with improvement in symptoms and leukocytosis, transferred to [facility], IR placed drain, unfortunately cultures did not yield any growth (see History section for details), patient had been on antibiotics. -C. difficile negative -GI pathogen panel pending -Follow-up blood cultures obtained in ED -IV ceftriaxone and metronidazole given in ED, will switch to IV meropenem - 2 L L/R bolus given, continue N/s + 40 KCl 100cc/hr x 15hrs - EDP to general surgery on-call, Dr. Arnoldo Morale family over this patient -will be contacted in a.m., bowel rest, IV antibiotics.

## 2022-06-07 NOTE — ED Provider Notes (Signed)
[facility] Provider Note  CSN: [id number] Arrival date & time: 06/07/22 1503  Chief Complaint(s) Emesis  HPI Joanna Sutton is a 67 y.o. female with PMH HTN, HLD, depression, GERD, previous prolonged hospital stay in December 2023 for colitis/diverticulitis with intra-abdominal abscess who presents emergency department for evaluation of abdominal pain nausea and vomiting.  Patient states that she has had symptoms for about 1 week and started vomiting 5 days ago.  She has been unable to tolerate p.o. and saw her primary care physician on 06/05/2022 and an outpatient CT showed pancolitis.  She was started on Cipro Flagyl but has been unable to keep the medication down and comes to the emergency department for persistent abdominal pain nausea and vomiting with diarrhea.  Denies chest pain, shortness of breath, headache, fever or other systemic symptoms.  Patient arrives hypotensive but vital signs otherwise stable.   Past Medical History Past Medical History:  Diagnosis Date   Depression    Diarrhea    Diverticulitis    GERD (gastroesophageal reflux disease)    High cholesterol    Hypertension    Patient Active Problem List   Diagnosis Date Noted   Pancolitis ([place]) 06/07/2022   Sepsis ([place]) 03/29/2022   Hypokalemia 03/28/2022   Depression 03/28/2022   Generalized abdominal pain 03/26/2022   Abdominal pain, left lower quadrant 03/23/2022   Chronic idiopathic constipation 03/23/2022   Diarrhea 03/23/2022   Colitis 03/22/2022   Acute gastric ulcer without hemorrhage or perforation 02/16/2022   Gastroesophageal reflux disease without esophagitis 02/16/2022   Excessive use of nonsteroidal anti-inflammatory drug (NSAID) 11/18/2021   Positive colorectal cancer screening using Cologuard test 11/18/2021   Essential hypertension, benign 11/29/2012   High cholesterol 11/29/2012   Dysphagia 11/29/2012   Home Medication(s) Prior to Admission  medications   Medication Sig Start Date End Date Taking? Authorizing Provider  acetaminophen (TYLENOL) 500 MG tablet Take 500 mg by mouth every 6 (six) hours as needed for pain.    [provider]  atorvastatin (LIPITOR) 40 MG tablet Take 40 mg by mouth daily.    [provider]  cetirizine (ZYRTEC) 10 MG tablet Take 10 mg by mouth daily.    [provider]  cyanocobalamin (VITAMIN B12) 1000 MCG tablet Take 2,000 mcg by mouth daily.    [provider]  escitalopram (LEXAPRO) 10 MG tablet Take 10 mg by mouth daily.    [provider]  lisinopril (ZESTRIL) 10 MG tablet Take 10-20 mg by mouth daily.    [provider]  pantoprazole (PROTONIX) 40 MG tablet Take 1 tablet (40 mg total) by mouth 2 (two) times daily before a meal. Patient taking differently: Take 40 mg by mouth daily. 09/27/14   Rehman, Mechele Dawley, MD  polyethylene glycol (MIRALAX) 17 g packet Take 17 g by mouth daily. 03/22/22   Gwenevere Abbot, PA-C  Past Surgical History Past Surgical History:  Procedure Laterality Date   APPENDECTOMY     AUGMENTATION MAMMAPLASTY     BIOPSY  01/13/2022   Procedure: BIOPSY;  Surgeon: Harvel Quale, MD;  [facility];  Service: Gastroenterology;;   BREAST ENHANCEMENT SURGERY     CESAREAN SECTION     CHOLECYSTECTOMY     COLONOSCOPY WITH PROPOFOL N/A 01/13/2022   Procedure: COLONOSCOPY WITH PROPOFOL;  Surgeon: Harvel Quale, MD;  [facility];  Service: Gastroenterology;  Laterality: N/A;  1030 am ASA 2   ESOPHAGEAL DILATION N/A 09/27/2014   Procedure: ESOPHAGEAL DILATION;  Surgeon: Rogene Houston, MD;  [facility];  Service: Endoscopy;  Laterality: N/A;   ESOPHAGOGASTRODUODENOSCOPY N/A 09/27/2014   Procedure: ESOPHAGOGASTRODUODENOSCOPY (EGD);  Surgeon: Rogene Houston, MD;  [facility];  Service: Endoscopy;  Laterality: N/A;  930   ESOPHAGOGASTRODUODENOSCOPY N/A 01/03/2015   Procedure: ESOPHAGOGASTRODUODENOSCOPY (EGD);  Surgeon: Rogene Houston, MD;  [facility];  Service: Endoscopy;  Laterality: N/A;  300   ESOPHAGOGASTRODUODENOSCOPY (EGD) WITH PROPOFOL N/A 01/13/2022   Procedure: ESOPHAGOGASTRODUODENOSCOPY (EGD) WITH PROPOFOL;  Surgeon: Harvel Quale, MD;  [facility];  Service: Gastroenterology;  Laterality: N/A;   NASAL SINUS SURGERY     PATELLA FRACTURE SURGERY Left    around 2016   POLYPECTOMY  01/13/2022   Procedure: POLYPECTOMY;  Surgeon: Harvel Quale, MD;  [facility];  Service: Gastroenterology;;   [place] INJECTION  01/13/2022   Procedure: SUBMUCOSAL LIFTING INJECTION;  Surgeon: Harvel Quale, MD;  [facility];  Service: Gastroenterology;;   SUBMUCOSAL TATTOO INJECTION  01/13/2022   Procedure: SUBMUCOSAL TATTOO INJECTION;  Surgeon: Montez Morita, Quillian Quince, MD;  [facility];  Service: Gastroenterology;;   Family History Family History  Problem Relation Age of Onset   Leukemia Father    Cirrhosis Brother        non alcholic cirrhosis, fatty liver    Social History Social History   Tobacco Use   Smoking status: Never    Passive exposure: Never   Smokeless tobacco: Never  Vaping Use   Vaping Use: Never used  Substance Use Topics   Alcohol use: No   Drug use: No   Allergies Bactrim [sulfamethoxazole-trimethoprim], Levaquin [levofloxacin], and Penicillins  Review of Systems Review of Systems  Gastrointestinal:  Positive for abdominal pain, diarrhea, nausea and vomiting.    Physical Exam Vital Signs  I have reviewed the triage vital signs BP (!) 109/57   Pulse 64   Temp 98 F (36.7 C) (Oral)   Resp 16   Ht '5\' 1"'$  (1.549 m)   Wt 59 kg   SpO2 100%   BMI 24.56 kg/m   Physical Exam Vitals and  nursing note reviewed.  Constitutional:      General: She is not in acute distress.    Appearance: She is well-developed.  HENT:     Head: Normocephalic and atraumatic.  Eyes:     Conjunctiva/sclera: Conjunctivae normal.  Cardiovascular:     Rate and Rhythm: Normal rate and regular rhythm.     Heart sounds: No murmur heard. Pulmonary:     Effort: Pulmonary effort is normal. No respiratory distress.     Breath sounds: Normal breath sounds.  Abdominal:     Palpations: Abdomen is soft.     Tenderness: There is abdominal tenderness.  Musculoskeletal:        General:  No swelling.     Cervical back: Neck supple.  Skin:    General: Skin is warm and dry.     Capillary Refill: Capillary refill takes less than 2 seconds.  Neurological:     Mental Status: She is alert.  Psychiatric:        Mood and Affect: Mood normal.     ED Results and Treatments Labs (all labs ordered are listed, but only abnormal results are displayed) Labs Reviewed  CBC WITH DIFFERENTIAL/PLATELET - Abnormal; Notable for the following components:      Result Value   Neutro Abs 8.6 (*)    All other components within normal limits  COMPREHENSIVE METABOLIC PANEL - Abnormal; Notable for the following components:   Sodium 131 (*)    Potassium 3.2 (*)    CO2 18 (*)    Glucose, Bld 112 (*)    BUN 54 (*)    Creatinine, Ser 3.76 (*)    Calcium 8.7 (*)    Albumin 3.3 (*)    GFR, Estimated 13 (*)    All other components within normal limits  SEDIMENTATION RATE - Abnormal; Notable for the following components:   Sed Rate 55 (*)    All other components within normal limits  RESP PANEL BY RT-PCR (RSV, FLU A&B, COVID)  RVPGX2  CULTURE, BLOOD (ROUTINE X 2)  CULTURE, BLOOD (ROUTINE X 2)  GASTROINTESTINAL PANEL BY PCR, STOOL (REPLACES STOOL CULTURE)  C DIFFICILE QUICK SCREEN W PCR REFLEX    LIPASE, BLOOD  LACTIC ACID, PLASMA  LACTIC ACID, PLASMA  URINALYSIS, ROUTINE W REFLEX MICROSCOPIC  C-REACTIVE PROTEIN                                                                                                                           Radiology CT ABDOMEN PELVIS WO CONTRAST  Result Date: 06/07/2022 CLINICAL DATA:  Left lower quadrant abdominal pain. Recent diagnosis of pancolitis. EXAM: CT ABDOMEN AND PELVIS WITHOUT CONTRAST TECHNIQUE: Multidetector CT imaging of the abdomen and pelvis was performed following the standard protocol without IV contrast. RADIATION DOSE REDUCTION: This exam was performed according to the departmental dose-optimization program which includes automated exposure control, adjustment of the mA and/or kV according to patient size and/or use of iterative reconstruction technique. COMPARISON:  CT abdomen pelvis dated 06/05/2022. FINDINGS: Evaluation of this exam is limited in the absence of intravenous contrast. Lower chest: Bibasilar linear atelectasis. There is mild eventration of the right hemidiaphragm. Small pocket of extraluminal air (47/2) and trace mesenteric edema and free fluid. Hepatobiliary: The liver is unremarkable. No biliary dilatation. Cholecystectomy. No retained calcified stone noted in the central CBD. Pancreas: Unremarkable. No pancreatic ductal dilatation or surrounding inflammatory changes. Spleen: Normal in size without focal abnormality. Adrenals/Urinary Tract: The adrenal glands are unremarkable. There is no hydronephrosis or nephrolithiasis on either side. Small left renal cyst. The visualized ureters and urinary bladder appear unremarkable. Stomach/Bowel: Diffuse thickening and inflammatory changes of the colon, progressed since  the prior CT and most severely involving the descending and sigmoid colon consistent with pancolitis. Small pocket of air adjacent to the sigmoid colon (coronal 42/5) consistent with a focally contained microperforation. No drainable fluid collection or abscess. There is a small hiatal hernia. There is dilatation of several loops of small bowel measuring up  to approximately 4 cm, likely ileus. Developing obstruction is less likely but not excluded. Appendectomy. Vascular/Lymphatic: Mild aortoiliac atherosclerotic disease. The IVC is unremarkable. No portal venous gas. There is no adenopathy. Reproductive: The uterus is poorly visualized.  No adnexal masses. Other: Small fat containing umbilical hernia. Musculoskeletal: Partially visualized left breast implant with calcified capsule. Degenerative changes of the spine and scoliosis. No acute osseous pathology. IMPRESSION: 1. Pancolitis, progressed since the prior CT. Small pocket of extraluminal air adjacent to the sigmoid colon consistent with a focally contained microperforation. No drainable fluid collection or abscess. 2. Dilated loops of small bowel, likely ileus. Developing obstruction is not excluded. 3.  Aortic Atherosclerosis (ICD10-I70.0). Electronically Signed   By: Anner Crete M.D.   On: 06/07/2022 17:07    Pertinent labs & imaging results that were available during my care of the patient were reviewed by me and considered in my medical decision making (see MDM for details).  Medications Ordered in ED Medications  potassium chloride 10 mEq in 100 mL IVPB (10 mEq Intravenous New Bag/Given 06/07/22 1749)  cefTRIAXone (ROCEPHIN) 2 g in sodium chloride 0.9 % 100 mL IVPB (has no administration in time range)  metroNIDAZOLE (FLAGYL) IVPB 500 mg (500 mg Intravenous New Bag/Given 06/07/22 1726)  lactated ringers bolus 1,000 mL (1,000 mLs Intravenous New Bag/Given 06/07/22 1537)  fentaNYL (SUBLIMAZE) injection 50 mcg (50 mcg Intravenous Given 06/07/22 1542)  ondansetron (ZOFRAN) injection 4 mg (4 mg Intravenous Given 06/07/22 1542)  lactated ringers bolus 1,000 mL (1,000 mLs Intravenous New Bag/Given 06/07/22 1720)                                                                                                                                     Procedures .Critical Care  Performed by: Teressa Lower,  MD Authorized by: Teressa Lower, MD   Critical care provider statement:    Critical care time (minutes):  30   Critical care was necessary to treat or prevent imminent or life-threatening deterioration of the following conditions:  Circulatory failure   Critical care was time spent personally by me on the following activities:  Development of treatment plan with patient or surrogate, discussions with consultants, evaluation of patient's response to treatment, examination of patient, ordering and review of laboratory studies, ordering and review of radiographic studies, ordering and performing treatments and interventions, pulse oximetry, re-evaluation of patient's condition and review of old charts   (including critical care time)  Medical Decision Making / ED Course   This patient presents to the ED for concern of abdominal pain, nausea, vomiting, this involves an  extensive number of treatment options, and is a complaint that carries with it a high risk of complications and morbidity.  The differential diagnosis includes diverticulitis, epiploic appendagitis, colitis, gastroenteritis, constipation, nephrolithiasis, inflammatory bowel disease,   MDM: Patient seen emergency room for evaluation of abdominal pain.  Physical exam with tenderness in the left lower quadrant but is otherwise unremarkable.  Patient arrives hypotensive with systolics in the [id number] but vital signs improving with fluid resuscitation.  Laboratory evaluation is concerning with a new BUN elevation to 54 and creatinine elevation to 3.76.  Potassium is 3.2 and sodium 131.  Sed rate elevated to 55 but lactic acid normal.  COVID and flu negative and obtained in the setting of diarrhea of unknown origin.  Repeat CT performed due to patient's history of abscess formation and with worsening symptoms, this scan was obtained.  CT scan showing pancolitis progressed since prior CT with a small pocket of extraluminal air consistent with a  focally contained microperforation.  I spoke briefly with the general surgeon on-call Dr. Okey Dupre who states that this is a patient of Dr. Arnoldo Morale who knows the patient well and will see the patient tomorrow morning in consultation, but the patient does not need to go to the operating room for this.  Patient started on ceftriaxone Flagyl and admitted for pancolitis failing outpatient oral antibiotics and severe AKI likely prerenal.   Additional history obtained: -Additional history obtained from sister-in-law -External records from outside source obtained and reviewed including: Chart review including previous notes, labs, imaging, consultation notes   Lab Tests: -I ordered, reviewed, and interpreted labs.   The pertinent results include:   Labs Reviewed  CBC WITH DIFFERENTIAL/PLATELET - Abnormal; Notable for the following components:      Result Value   Neutro Abs 8.6 (*)    All other components within normal limits  COMPREHENSIVE METABOLIC PANEL - Abnormal; Notable for the following components:   Sodium 131 (*)    Potassium 3.2 (*)    CO2 18 (*)    Glucose, Bld 112 (*)    BUN 54 (*)    Creatinine, Ser 3.76 (*)    Calcium 8.7 (*)    Albumin 3.3 (*)    GFR, Estimated 13 (*)    All other components within normal limits  SEDIMENTATION RATE - Abnormal; Notable for the following components:   Sed Rate 55 (*)    All other components within normal limits  RESP PANEL BY RT-PCR (RSV, FLU A&B, COVID)  RVPGX2  CULTURE, BLOOD (ROUTINE X 2)  CULTURE, BLOOD (ROUTINE X 2)  GASTROINTESTINAL PANEL BY PCR, STOOL (REPLACES STOOL CULTURE)  C DIFFICILE QUICK SCREEN W PCR REFLEX    LIPASE, BLOOD  LACTIC ACID, PLASMA  LACTIC ACID, PLASMA  URINALYSIS, ROUTINE W REFLEX MICROSCOPIC  C-REACTIVE PROTEIN      EKG   EKG Interpretation  Date/Time:  Sunday June 07 2022 15:19:33 EST Ventricular Rate:  86 PR Interval:  144 QRS Duration: 84 QT Interval:  442 QTC Calculation: 529 R  Axis:   5 Text Interpretation: Sinus rhythm Prolonged QT interval Confirmed by Tejah Brekke ([id number]) on 06/07/2022 6:24:20 PM         Imaging Studies ordered: I ordered imaging studies including CT abdomen pelvis I independently visualized and interpreted imaging. I agree with the radiologist interpretation   Medicines ordered and prescription drug management: Meds ordered this encounter  Medications   lactated ringers bolus 1,000 mL   fentaNYL (SUBLIMAZE) injection 50 mcg  ondansetron (ZOFRAN) injection 4 mg   lactated ringers bolus 1,000 mL   potassium chloride 10 mEq in 100 mL IVPB   cefTRIAXone (ROCEPHIN) 2 g in sodium chloride 0.9 % 100 mL IVPB    Order Specific Question:   Antibiotic Indication:    Answer:   Intra-abdominal   metroNIDAZOLE (FLAGYL) IVPB 500 mg    Order Specific Question:   Antibiotic Indication:    Answer:   Intra-abdominal Infection    -I have reviewed the patients home medicines and have made adjustments as needed  Critical interventions Fluid resuscitation, multiple antibiotics  Consultations Obtained: I requested consultation with the general surgeon on-call Dr. Okey Dupre,  and discussed lab and imaging findings as well as pertinent plan - they recommend: IV fluids, bowel rest antibiotics   Cardiac Monitoring: The patient was maintained on a cardiac monitor.  I personally viewed and interpreted the cardiac monitored which showed an underlying rhythm of: NSR  Social Determinants of Health:  Factors impacting patients care include: none   Reevaluation: After the interventions noted above, I reevaluated the patient and found that they have :improved  Co morbidities that complicate the patient evaluation  Past Medical History:  Diagnosis Date   Depression    Diarrhea    Diverticulitis    GERD (gastroesophageal reflux disease)    High cholesterol    Hypertension       Dispostion: I considered admission for this patient, and due to  severe AKI with pancolitis resistant to outpatient oral antibiotics patient require hospital admission     Final Clinical Impression(s) / ED Diagnoses Final diagnoses:  Colitis  AKI (acute kidney injury) ([place])     '@PCDICTATION'$ @    Teressa Lower, MD 06/07/22 1825

## 2022-06-07 NOTE — Assessment & Plan Note (Signed)
Cr- Elevated 3.76.  Baseline 0.5-0.6 -Hydrate. -Hold Lisinopril

## 2022-06-07 NOTE — ED Triage Notes (Signed)
States she has been having bilateral lower ABD pain, N/V/D x4 days.  Was seen Friday for same symptoms given cipro & metronidazole.  Pt states she is showing no improvements.  Pt feels weak.

## 2022-06-07 NOTE — H&P (Addendum)
History and Physical    Joanna Sutton [id number] DOB: 05-29-1955 DOA: 06/07/2022  PCP: Glenda Chroman, MD   Patient coming from: Home  I have personally briefly reviewed patient's old medical records in [place]  Chief Complaint: Abdominal pain  HPI: Joanna Sutton is a 67 y.o. female with medical history significant for hypertension, depression.  Patient reports lower abdominal pain, vomiting and diarrhea that started 3 days ago- on thursday 2/28.  She went to see her primary care provider, had a CT and was diagnosed with pancolitis, was prescribed a course of ciprofloxacin and metronidazole which she started taking afternoon of 3/1 and has been compliant with.  She reports multiple episodes of vomiting, with watery stools.  Hospitalized 12/17 to 12/30 for abdominal abscess/descending and sigmoid mild colitis.  Initially started on Rocephin and Flagyl, with uptrending WBC, antibiotics were switched to meropenem 12/24 for 5 days and then back to rocephin and Flagyl.  Patient was transferred to [facility] for placement of left transgluteal drain by IR- 12/28, aspiration yielded 10 mL of cloudy fluid and debris.  Cultures yielded no growth.  GI pathogen panel, stool C. difficile negative.  ED Course: Initial hypotension blood pressure down to 86/45.  WBC 10.  Creatinine elevated at 3.76.  ESR 55.  Lactic acid 1.5 >  1.8.   Stool  C. difficile negative.  GI pathogen panel pending. CT abdomen and pelvis without contrast-pancolitis progressed since prior CT.  Small pocket of extraluminal air adjacent to sigmoid colon consistent with focally contained microperforation. Blood cultures obtained Started on ceftriaxone and metronidazole. - EDP talked to general surgery, IV antibiotics, bowel rest, IV fluids for now. Dr. Arnoldo Morale is familiar with patient, and will be contacted to see patient in a.m.  Review of Systems: As per HPI all other systems reviewed and negative.  Past Medical History:   Diagnosis Date   Depression    Diarrhea    Diverticulitis    GERD (gastroesophageal reflux disease)    High cholesterol    Hypertension     Past Surgical History:  Procedure Laterality Date   APPENDECTOMY     AUGMENTATION MAMMAPLASTY     BIOPSY  01/13/2022   Procedure: BIOPSY;  Surgeon: Harvel Quale, MD;  [facility];  Service: Gastroenterology;;   BREAST ENHANCEMENT SURGERY     CESAREAN SECTION     CHOLECYSTECTOMY     COLONOSCOPY WITH PROPOFOL N/A 01/13/2022   Procedure: COLONOSCOPY WITH PROPOFOL;  Surgeon: Harvel Quale, MD;  [facility];  Service: Gastroenterology;  Laterality: N/A;  1030 am ASA 2   ESOPHAGEAL DILATION N/A 09/27/2014   Procedure: ESOPHAGEAL DILATION;  Surgeon: Rogene Houston, MD;  [facility];  Service: Endoscopy;  Laterality: N/A;   ESOPHAGOGASTRODUODENOSCOPY N/A 09/27/2014   Procedure: ESOPHAGOGASTRODUODENOSCOPY (EGD);  Surgeon: Rogene Houston, MD;  [facility];  Service: Endoscopy;  Laterality: N/A;  930   ESOPHAGOGASTRODUODENOSCOPY N/A 01/03/2015   Procedure: ESOPHAGOGASTRODUODENOSCOPY (EGD);  Surgeon: Rogene Houston, MD;  [facility];  Service: Endoscopy;  Laterality: N/A;  300   ESOPHAGOGASTRODUODENOSCOPY (EGD) WITH PROPOFOL N/A 01/13/2022   Procedure: ESOPHAGOGASTRODUODENOSCOPY (EGD) WITH PROPOFOL;  Surgeon: Harvel Quale, MD;  [facility];  Service: Gastroenterology;  Laterality: N/A;   NASAL SINUS SURGERY     PATELLA FRACTURE SURGERY Left    around 2016   POLYPECTOMY  01/13/2022   Procedure: POLYPECTOMY;  Surgeon: Harvel Quale,  MD;  [facility];  Service: Gastroenterology;;   SUBMUCOSAL LIFTING INJECTION  01/13/2022   Procedure: SUBMUCOSAL LIFTING INJECTION;  Surgeon: Harvel Quale, MD;  [facility];  Service: Gastroenterology;;   SUBMUCOSAL TATTOO INJECTION  01/13/2022   Procedure:  SUBMUCOSAL TATTOO INJECTION;  Surgeon: Montez Morita, Quillian Quince, MD;  [facility];  Service: Gastroenterology;;     reports that she has never smoked. She has never been exposed to tobacco smoke. She has never used smokeless tobacco. She reports that she does not drink alcohol and does not use drugs.  Allergies  Allergen Reactions   Bactrim [Sulfamethoxazole-Trimethoprim] Hives   Levaquin [Levofloxacin] Nausea And Vomiting   Penicillins Rash    Family History  Problem Relation Age of Onset   Leukemia Father    Cirrhosis Brother        non alcholic cirrhosis, fatty liver    Prior to Admission medications   Medication Sig Start Date End Date Taking? Authorizing Provider  acetaminophen (TYLENOL) 500 MG tablet Take 500 mg by mouth every 6 (six) hours as needed for pain.    [provider]  atorvastatin (LIPITOR) 40 MG tablet Take 40 mg by mouth daily.    [provider]  cetirizine (ZYRTEC) 10 MG tablet Take 10 mg by mouth daily.    [provider]  cyanocobalamin (VITAMIN B12) 1000 MCG tablet Take 2,000 mcg by mouth daily.    [provider]  escitalopram (LEXAPRO) 10 MG tablet Take 10 mg by mouth daily.    [provider]  lisinopril (ZESTRIL) 10 MG tablet Take 10-20 mg by mouth daily.    [provider]  pantoprazole (PROTONIX) 40 MG tablet Take 1 tablet (40 mg total) by mouth 2 (two) times daily before a meal. Patient taking differently: Take 40 mg by mouth daily. 09/27/14   Rehman, Mechele Dawley, MD  polyethylene glycol (MIRALAX) 17 g packet Take 17 g by mouth daily. 03/22/22   Gwenevere Abbot, PA-C    Physical Exam: Vitals:   06/07/22 1530 06/07/22 1600 06/07/22 1630 06/07/22 1700  BP: (!) 90/51 (!) 86/45 (!) 110/47 (!) 109/57  Pulse: 80 71 70 64  Resp: 18 18 (!) 22 16  Temp:      TempSrc:      SpO2: 97% 99% 100% 100%  Weight:      Height:        Constitutional: NAD, calm, comfortable Vitals:   06/07/22  1530 06/07/22 1600 06/07/22 1630 06/07/22 1700  BP: (!) 90/51 (!) 86/45 (!) 110/47 (!) 109/57  Pulse: 80 71 70 64  Resp: 18 18 (!) 22 16  Temp:      TempSrc:      SpO2: 97% 99% 100% 100%  Weight:      Height:       Eyes: PERRL, lids and conjunctivae normal ENMT: Mucous membranes are moist.   Neck: normal, supple, no masses, no thyromegaly Respiratory: clear to auscultation bilaterally, no wheezing, no crackles. Normal respiratory effort. No accessory muscle use.  Cardiovascular: Regular rate and rhythm, no murmurs / rubs / gallops. No extremity edema. 2+ pedal pulses.  Abdomen: Markedly tender diffusely, guarding present. Musculoskeletal: no clubbing / cyanosis. No joint deformity upper and lower extremities.  Skin: no rashes, lesions, ulcers. No induration Neurologic: No apparent cranial nerve abnormality, moving extremities spontaneously.Marland Kitchen  Psychiatric: Normal judgment and insight. Alert and oriented x 3. Normal mood.   Labs on Admission: I have personally reviewed  following labs and imaging studies  CBC: Recent Labs  Lab 06/07/22 1540  WBC 10.0  NEUTROABS 8.6*  HGB 12.7  HCT 38.6  MCV 92.6  PLT [id number]   Basic Metabolic Panel: Recent Labs  Lab 06/07/22 1540  NA 131*  K 3.2*  CL 98  CO2 18*  GLUCOSE 112*  BUN 54*  CREATININE 3.76*  CALCIUM 8.7*   GFR: Estimated Creatinine Clearance: 11.8 mL/min (A) (by C-G formula based on SCr of 3.76 mg/dL (H)). Liver Function Tests: Recent Labs  Lab 06/07/22 1540  AST 15  ALT 12  ALKPHOS 47  BILITOT 0.5  PROT 6.8  ALBUMIN 3.3*   Recent Labs  Lab 06/07/22 1540  LIPASE 28   Radiological Exams on Admission: CT ABDOMEN PELVIS WO CONTRAST  Result Date: 06/07/2022 CLINICAL DATA:  Left lower quadrant abdominal pain. Recent diagnosis of pancolitis. EXAM: CT ABDOMEN AND PELVIS WITHOUT CONTRAST TECHNIQUE: Multidetector CT imaging of the abdomen and pelvis was performed following the standard protocol without IV contrast.  RADIATION DOSE REDUCTION: This exam was performed according to the departmental dose-optimization program which includes automated exposure control, adjustment of the mA and/or kV according to patient size and/or use of iterative reconstruction technique. COMPARISON:  CT abdomen pelvis dated 06/05/2022. FINDINGS: Evaluation of this exam is limited in the absence of intravenous contrast. Lower chest: Bibasilar linear atelectasis. There is mild eventration of the right hemidiaphragm. Small pocket of extraluminal air (47/2) and trace mesenteric edema and free fluid. Hepatobiliary: The liver is unremarkable. No biliary dilatation. Cholecystectomy. No retained calcified stone noted in the central CBD. Pancreas: Unremarkable. No pancreatic ductal dilatation or surrounding inflammatory changes. Spleen: Normal in size without focal abnormality. Adrenals/Urinary Tract: The adrenal glands are unremarkable. There is no hydronephrosis or nephrolithiasis on either side. Small left renal cyst. The visualized ureters and urinary bladder appear unremarkable. Stomach/Bowel: Diffuse thickening and inflammatory changes of the colon, progressed since the prior CT and most severely involving the descending and sigmoid colon consistent with pancolitis. Small pocket of air adjacent to the sigmoid colon (coronal 42/5) consistent with a focally contained microperforation. No drainable fluid collection or abscess. There is a small hiatal hernia. There is dilatation of several loops of small bowel measuring up to approximately 4 cm, likely ileus. Developing obstruction is less likely but not excluded. Appendectomy. Vascular/Lymphatic: Mild aortoiliac atherosclerotic disease. The IVC is unremarkable. No portal venous gas. There is no adenopathy. Reproductive: The uterus is poorly visualized.  No adnexal masses. Other: Small fat containing umbilical hernia. Musculoskeletal: Partially visualized left breast implant with calcified capsule.  Degenerative changes of the spine and scoliosis. No acute osseous pathology. IMPRESSION: 1. Pancolitis, progressed since the prior CT. Small pocket of extraluminal air adjacent to the sigmoid colon consistent with a focally contained microperforation. No drainable fluid collection or abscess. 2. Dilated loops of small bowel, likely ileus. Developing obstruction is not excluded. 3.  Aortic Atherosclerosis (ICD10-I70.0). Electronically Signed   By: Anner Crete M.D.   On: 06/07/2022 17:07    EKG: Independently reviewed.  Sinus rhythm rate 86, QTc 529.  No significant change from prior.  Assessment/Plan Principal Problem:   Pancolitis ([place]) Active Problems:   AKI (acute kidney injury) ([place])   Essential hypertension, benign   Hypokalemia   Assessment and Plan: * Pancolitis ([place]) Presenting with multiple episodes of vomiting and diarrhea x 3 days.  Has received just about 2 days of oral antibiotics. CT showing progression of pancolitis with focally contained microperforation.  No  drainable fluid collection or abscess.  Likely ileus. - In December hospitalized also for colitis and abscess.  WBC uptrending on empiric antibiotics, hence was switched to meropenem, with improvement in symptoms and leukocytosis, transferred to [facility], IR placed drain, unfortunately cultures did not yield any growth (see History section for details), patient had been on antibiotics. -C. difficile negative -GI pathogen panel pending -Follow-up blood cultures obtained in ED -IV ceftriaxone and metronidazole given in ED, will switch to IV meropenem - 2 L L/R bolus given, continue N/s + 40 KCl 100cc/hr x 15hrs - EDP to general surgery on-call, Dr. Arnoldo Morale family over this patient -will be contacted in a.m., bowel rest, IV antibiotics.  AKI (acute kidney injury) ([place]) Cr- Elevated 3.76.  Baseline 0.5-0.6 -Hydrate. -Hold Lisinopril  Hypokalemia Potassium 3.2.  Mag  2.2. QTC prolonged-529. - Replete  k.  Essential hypertension, benign Initial hypotension , systolic down to 86.  Likely from dehydration. -Hold lisinopril 10 mg   DVT prophylaxis: Heparin Code Status: FULL Code-confirmed with patient and sister-in-law at bedside. Family Communication: Sister-in-law Rhonda at bedside. Disposition Plan: > 2 days Consults called: Gen Surg Admission status: INpt Tele I certify that at the point of admission it is my clinical judgment that the patient will require inpatient hospital care spanning beyond 2 midnights from the point of admission due to high intensity of service, high risk for further deterioration and high frequency of surveillance required.    Author: Bethena Roys, MD 06/07/2022 9:03 PM  For on call review [email or web address].

## 2022-06-08 DIAGNOSIS — R197 Diarrhea, unspecified: Secondary | ICD-10-CM | POA: Diagnosis not present

## 2022-06-08 DIAGNOSIS — R1032 Left lower quadrant pain: Secondary | ICD-10-CM | POA: Diagnosis not present

## 2022-06-08 DIAGNOSIS — K5100 Ulcerative (chronic) pancolitis without complications: Secondary | ICD-10-CM | POA: Diagnosis not present

## 2022-06-08 LAB — BASIC METABOLIC PANEL
Anion gap: 8 (ref 5–15)
BUN: 34 mg/dL — ABNORMAL HIGH (ref 8–23)
CO2: 19 mmol/L — ABNORMAL LOW (ref 22–32)
Calcium: 7.9 mg/dL — ABNORMAL LOW (ref 8.9–10.3)
Chloride: 108 mmol/L (ref 98–111)
Creatinine, Ser: 1.77 mg/dL — ABNORMAL HIGH (ref 0.44–1.00)
GFR, Estimated: 31 mL/min — ABNORMAL LOW (ref >60)
Glucose, Bld: 77 mg/dL (ref 70–99)
Potassium: 4 mmol/L (ref 3.5–5.1)
Sodium: 135 mmol/L (ref 135–145)

## 2022-06-08 LAB — GASTROINTESTINAL PANEL BY PCR, STOOL (REPLACES STOOL CULTURE)

## 2022-06-08 LAB — CBC
HCT: 32.2 % — ABNORMAL LOW (ref 36.0–46.0)
Hemoglobin: 10.6 g/dL — ABNORMAL LOW (ref 12.0–15.0)
MCH: 30.9 pg (ref 26.0–34.0)
MCHC: 32.9 g/dL (ref 30.0–36.0)
MCV: 93.9 fL (ref 80.0–100.0)
Platelets: 224 10*3/uL (ref 150–400)
RBC: 3.43 MIL/uL — ABNORMAL LOW (ref 3.87–5.11)
RDW: 13.7 % (ref 11.5–15.5)
WBC: 9.1 10*3/uL (ref 4.0–10.5)
nRBC: 0 % (ref 0.0–0.2)

## 2022-06-08 MED ORDER — SODIUM CHLORIDE 0.9 % IV SOLN
1.0000 g | Freq: Two times a day (BID) | INTRAVENOUS | Status: DC
  Administered 2022-06-08 (×2): 1 g via INTRAVENOUS
  Filled 2022-06-08 (×3): qty 20

## 2022-06-08 MED ORDER — PANTOPRAZOLE SODIUM 40 MG PO TBEC
40.0000 mg | DELAYED_RELEASE_TABLET | Freq: Every day | ORAL | Status: DC
  Administered 2022-06-08 – 2022-06-10 (×3): 40 mg via ORAL
  Filled 2022-06-08 (×3): qty 1

## 2022-06-08 NOTE — Progress Notes (Signed)
PROGRESS NOTE    Patient: Joanna Sutton                            PCP: Glenda Chroman, MD                    DOB: 1955-11-03            DOA: 06/07/2022 [id number]             DOS: 06/08/2022, 10:45 AM   LOS: 1 day   Date of Service: The patient was seen and examined on 06/08/2022  Subjective:   The patient was seen and examined this morning. Hemodynamically stable. Tolerable abdominal pain with analgesics No issues overnight .  Brief Narrative:   Joanna Sutton is a 67 y.o. female with medical history significant for hypertension, depression.  Patient reports lower abdominal pain, vomiting and diarrhea that started 3 days ago- on thursday 2/28.  She went to see her primary care provider, had a CT and was diagnosed with pancolitis, was prescribed a course of ciprofloxacin and metronidazole which she started taking afternoon of 3/1 and has been compliant with.  She reports multiple episodes of vomiting, with watery stools.   Hospitalized 12/17 to 12/30 for abdominal abscess/descending and sigmoid mild colitis.  Initially started on Rocephin and Flagyl, with uptrending WBC, antibiotics were switched to meropenem 12/24 for 5 days and then back to rocephin and Flagyl.  Patient was transferred to [facility] for placement of left transgluteal drain by IR- 12/28, aspiration yielded 10 mL of cloudy fluid and debris.  Cultures yielded no growth.  GI pathogen panel, stool C. difficile negative.   ED Course: Initial hypotension blood pressure down to 86/45.  WBC 10.  Creatinine elevated at 3.76.  ESR 55.  Lactic acid 1.5 >  1.8.   Stool  C. difficile negative.  GI pathogen panel pending. CT abdomen and pelvis without contrast-pancolitis progressed since prior CT.  Small pocket of extraluminal air adjacent to sigmoid colon consistent with focally contained microperforation. Blood cultures obtained Started on ceftriaxone and metronidazole. - EDP talked to general surgery, IV antibiotics, bowel rest,  IV fluids for now. Dr. Arnoldo Morale is familiar with patient, and will be contacted to see patient in a.m.    Assessment & Plan:   Principal Problem:   Pancolitis ([place]) Active Problems:   AKI (acute kidney injury) ([place])   Essential hypertension, benign   Hypokalemia     Assessment and Plan: * Pancolitis ([place]) -Presenting with multiple episodes of vomiting and diarrhea x 3 days.  Has received just about 2 days of oral antibiotics.  -CT showing progression of pancolitis with focally contained microperforation.   No drainable fluid collection or abscess.  Likely ileus.  - In December hospitalized also for colitis and abscess.  WBC uptrending on empiric antibiotics, hence was switched to meropenem, with improvement in symptoms and leukocytosis, transferred to [facility], IR placed drain, unfortunately cultures did not yield any growth (see History section for details), patient had been on antibiotics. -C. difficile negative -GI pathogen panel pending -Follow-up blood cultures >>> - Cont.  IV meropenem - conrt.IVF  - Gen, surgery on-call, Dr. Renold Genta familiar over this patient    AKI (acute kidney injury) ([place]) -Improving Cr- Elevated 3.76.  Baseline 0.5-0.6 -Hydrate. -Hold Lisinopril Lab Results  Component Value Date   CREATININE 1.77 (H) 06/08/2022   CREATININE 3.76 (H) 06/07/2022   CREATININE  0.62 04/07/2022     Hypokalemia Potassium 3.2. >>  4.0 Mag  2.2. QTC prolonged-529. - Replete k.  Essential hypertension, benign Initial hypotension , systolic down to 86.  Likely from dehydration. -Stabilizing -Hold lisinopril 10 mg      --------------------------------------------------------------------------------------------------------------------  DVT prophylaxis:  heparin injection 5,000 Units Start: 06/07/22 2200   Code Status:   Code Status: Full Code  Family Communication: No family member present, discussed with The above findings and plan of care  has been discussed with patient (and family)  in detail,  they expressed understanding and agreement of above. -Advance care planning has been discussed.   Admission status:   Status is: Inpatient Remains inpatient appropriate because: Needing IV fluids, IV   Disposition: From  - home             Planning for discharge in 1-2 days: to   Procedures:   No admission procedures for hospital encounter.   Antimicrobials:  Anti-infectives (From admission, onward)    Start     Dose/Rate Route Frequency Ordered Stop   06/08/22 1000  meropenem (MERREM) 1 g in sodium chloride 0.9 % 100 mL IVPB        1 g 200 mL/hr over 30 Minutes Intravenous Every 12 hours 06/08/22 0850     06/07/22 2200  meropenem (MERREM) 500 mg in sodium chloride 0.9 % 100 mL IVPB  Status:  Discontinued        500 mg 200 mL/hr over 30 Minutes Intravenous Every 12 hours 06/07/22 2110 06/08/22 0850   06/07/22 1730  cefTRIAXone (ROCEPHIN) 2 g in sodium chloride 0.9 % 100 mL IVPB        2 g 200 mL/hr over 30 Minutes Intravenous  Once 06/07/22 1719 06/07/22 2056   06/07/22 1730  metroNIDAZOLE (FLAGYL) IVPB 500 mg  Status:  Discontinued        500 mg 100 mL/hr over 60 Minutes Intravenous Every 12 hours 06/07/22 1719 06/07/22 2100        Medication:   heparin  5,000 Units Subcutaneous Q8H    acetaminophen **OR** acetaminophen, morphine injection, promethazine (PHENERGAN) injection (IM or IVPB)   Objective:   Vitals:   06/08/22 0322 06/08/22 0647 06/08/22 0726 06/08/22 0849  BP: (!) 106/54 (!) 106/57 (!) 102/50 (!) 100/49  Pulse: 76 79 67 71  Resp: '18 16 20 18  '$ Temp: 98.3 F (36.8 C) 98.4 F (36.9 C) 99 F (37.2 C) 98.2 F (36.8 C)  TempSrc:   Oral Oral  SpO2: 100% 100% 98% 96%  Weight:      Height:        Intake/Output Summary (Last 24 hours) at 06/08/2022 1045 Last data filed at 06/08/2022 [id number] Gross per 24 hour  Intake 800.82 ml  Output 600 ml  Net 200.82 ml   Filed Weights   06/07/22 1514   Weight: 59 kg     Physical examination:   Constitution:  Alert, cooperative, no distress,  Appears calm and comfortable  Psychiatric:   Normal and stable mood and affect, cognition intact,   HEENT:        Normocephalic, PERRL, otherwise with in Normal limits  Chest:         Chest symmetric Cardio vascular:  S1/S2, RRR, No murmure, No Rubs or Gallops  pulmonary: Clear to auscultation bilaterally, respirations unlabored, negative wheezes / crackles Abdomen: Soft, non-tender, non-distended, bowel sounds,no masses, no organomegaly Muscular skeletal: Limited exam - in bed, able to move all 4 extremities,  Neuro: CNII-XII intact. , normal motor and sensation, reflexes intact  Extremities: No pitting edema lower extremities, +2 pulses  Skin: Dry, warm to touch, negative for any Rashes, No open wounds Wounds: per nursing documentation   ------------------------------------------------------------------------------------------------------------------------------------------    LABs:     Latest Ref Rng & Units 06/08/2022    4:12 AM 06/07/2022    3:40 PM 04/07/2022    3:05 PM  CBC  WBC 4.0 - 10.5 K/uL 9.1  10.0  9.3   Hemoglobin 12.0 - 15.0 g/dL 10.6  12.7  11.6   Hematocrit 36.0 - 46.0 % 32.2  38.6  34.4   Platelets 150 - 400 K/uL 224  242  563       Latest Ref Rng & Units 06/08/2022    4:12 AM 06/07/2022    3:40 PM 04/07/2022    3:05 PM  CMP  Glucose 70 - 99 mg/dL 77  112  99   BUN 8 - 23 mg/dL 34  54  7   Creatinine 0.44 - 1.00 mg/dL 1.77  3.76  0.62   Sodium 135 - 145 mmol/L 135  131  136   Potassium 3.5 - 5.1 mmol/L 4.0  3.2  4.6   Chloride 98 - 111 mmol/L 108  98  99   CO2 22 - 32 mmol/L '19  18  24   '$ Calcium 8.9 - 10.3 mg/dL 7.9  8.7  9.5   Total Protein 6.5 - 8.1 g/dL  6.8    Total Bilirubin 0.3 - 1.2 mg/dL  0.5    Alkaline Phos 38 - 126 U/L  47    AST 15 - 41 U/L  15    ALT 0 - 44 U/L  12         Micro Results Recent Results (from the past 240 hour(s))  Blood culture  (routine x 2)     Status: None (Preliminary result)   Collection Time: 06/07/22  3:21 PM   Specimen: Right Antecubital; Blood  Result Value Ref Range Status   Specimen Description   Final    RIGHT ANTECUBITAL BOTTLES DRAWN AEROBIC AND ANAEROBIC   Special Requests   Final    Blood Culture results may not be optimal due to an excessive volume of blood received in culture bottles   Culture   Final    NO GROWTH < 12 HOURS Performed at [facility], [address], [place], [place] [address]    Report Status PENDING  Incomplete  Resp panel by RT-PCR (RSV, Flu A&B, Covid) Anterior Nasal Swab     Status: None   Collection Time: 06/07/22  3:35 PM   Specimen: Anterior Nasal Swab  Result Value Ref Range Status   SARS Coronavirus 2 by RT PCR NEGATIVE NEGATIVE Final    Comment: (NOTE) SARS-CoV-2 target nucleic acids are NOT DETECTED.  The SARS-CoV-2 RNA is generally detectable in upper respiratory specimens during the acute phase of infection. The lowest concentration of SARS-CoV-2 viral copies this assay can detect is 138 copies/mL. A negative result does not preclude SARS-Cov-2 infection and should not be used as the sole basis for treatment or other patient management decisions. A negative result may occur with  improper specimen collection/handling, submission of specimen other than nasopharyngeal swab, presence of viral mutation(s) within the areas targeted by this assay, and inadequate number of viral copies(<138 copies/mL). A negative result must be combined with clinical observations, patient history, and epidemiological information. The expected result is Negative.  Fact Sheet  for Patients:  EntrepreneurPulse.com.au  Fact Sheet for Healthcare Providers:  IncredibleEmployment.be  This test is no t yet approved or cleared by the [place] FDA and  has been authorized for detection and/or diagnosis of SARS-CoV-2 by FDA under an Emergency Use  Authorization (EUA). This EUA will remain  in effect (meaning this test can be used) for the duration of the COVID-19 declaration under Section 564(b)(1) of the Act, 21 U.S.C.section 360bbb-3(b)(1), unless the authorization is terminated  or revoked sooner.       Influenza A by PCR NEGATIVE NEGATIVE Final   Influenza B by PCR NEGATIVE NEGATIVE Final    Comment: (NOTE) The Xpert Xpress SARS-CoV-2/FLU/RSV plus assay is intended as an aid in the diagnosis of influenza from Nasopharyngeal swab specimens and should not be used as a sole basis for treatment. Nasal washings and aspirates are unacceptable for Xpert Xpress SARS-CoV-2/FLU/RSV testing.  Fact Sheet for Patients: EntrepreneurPulse.com.au  Fact Sheet for Healthcare Providers: IncredibleEmployment.be  This test is not yet approved or cleared by the [place] FDA and has been authorized for detection and/or diagnosis of SARS-CoV-2 by FDA under an Emergency Use Authorization (EUA). This EUA will remain in effect (meaning this test can be used) for the duration of the COVID-19 declaration under Section 564(b)(1) of the Act, 21 U.S.C. section 360bbb-3(b)(1), unless the authorization is terminated or revoked.     Resp Syncytial Virus by PCR NEGATIVE NEGATIVE Final    Comment: (NOTE) Fact Sheet for Patients: EntrepreneurPulse.com.au  Fact Sheet for Healthcare Providers: IncredibleEmployment.be  This test is not yet approved or cleared by the [place] FDA and has been authorized for detection and/or diagnosis of SARS-CoV-2 by FDA under an Emergency Use Authorization (EUA). This EUA will remain in effect (meaning this test can be used) for the duration of the COVID-19 declaration under Section 564(b)(1) of the Act, 21 U.S.C. section 360bbb-3(b)(1), unless the authorization is terminated or revoked.  Performed at [facility], [address],  [place], [place] [address]   Blood culture (routine x 2)     Status: None (Preliminary result)   Collection Time: 06/07/22  4:00 PM   Specimen: BLOOD LEFT ARM  Result Value Ref Range Status   Specimen Description BLOOD LEFT ARM BOTTLES DRAWN AEROBIC AND ANAEROBIC  Final   Special Requests Blood Culture adequate volume  Final   Culture   Final    NO GROWTH < 12 HOURS Performed at [facility] [address] [place], [place] [address]    Report Status PENDING  Incomplete  C Difficile Quick Screen w PCR reflex     Status: None   Collection Time: 06/07/22  6:40 PM   Specimen: Stool  Result Value Ref Range Status   C Diff antigen NEGATIVE NEGATIVE Final   C Diff toxin NEGATIVE NEGATIVE Final   C Diff interpretation No C. difficile detected.  Final    Comment: Performed at [facility], [address], [place], [place] [address]    Radiology Reports CT ABDOMEN PELVIS WO CONTRAST  Result Date: 06/07/2022 CLINICAL DATA:  Left lower quadrant abdominal pain. Recent diagnosis of pancolitis. EXAM: CT ABDOMEN AND PELVIS WITHOUT CONTRAST TECHNIQUE: Multidetector CT imaging of the abdomen and pelvis was performed following the standard protocol without IV contrast. RADIATION DOSE REDUCTION: This exam was performed according to the departmental dose-optimization program which includes automated exposure control, adjustment of the mA and/or kV according to patient size and/or use of iterative reconstruction technique. COMPARISON:  CT abdomen  pelvis dated 06/05/2022. FINDINGS: Evaluation of this exam is limited in the absence of intravenous contrast. Lower chest: Bibasilar linear atelectasis. There is mild eventration of the right hemidiaphragm. Small pocket of extraluminal air (47/2) and trace mesenteric edema and free fluid. Hepatobiliary: The liver is unremarkable. No biliary dilatation. Cholecystectomy. No retained calcified stone noted in the central CBD. Pancreas: Unremarkable. No pancreatic ductal dilatation  or surrounding inflammatory changes. Spleen: Normal in size without focal abnormality. Adrenals/Urinary Tract: The adrenal glands are unremarkable. There is no hydronephrosis or nephrolithiasis on either side. Small left renal cyst. The visualized ureters and urinary bladder appear unremarkable. Stomach/Bowel: Diffuse thickening and inflammatory changes of the colon, progressed since the prior CT and most severely involving the descending and sigmoid colon consistent with pancolitis. Small pocket of air adjacent to the sigmoid colon (coronal 42/5) consistent with a focally contained microperforation. No drainable fluid collection or abscess. There is a small hiatal hernia. There is dilatation of several loops of small bowel measuring up to approximately 4 cm, likely ileus. Developing obstruction is less likely but not excluded. Appendectomy. Vascular/Lymphatic: Mild aortoiliac atherosclerotic disease. The IVC is unremarkable. No portal venous gas. There is no adenopathy. Reproductive: The uterus is poorly visualized.  No adnexal masses. Other: Small fat containing umbilical hernia. Musculoskeletal: Partially visualized left breast implant with calcified capsule. Degenerative changes of the spine and scoliosis. No acute osseous pathology. IMPRESSION: 1. Pancolitis, progressed since the prior CT. Small pocket of extraluminal air adjacent to the sigmoid colon consistent with a focally contained microperforation. No drainable fluid collection or abscess. 2. Dilated loops of small bowel, likely ileus. Developing obstruction is not excluded. 3.  Aortic Atherosclerosis (ICD10-I70.0). Electronically Signed   By: Anner Crete M.D.   On: 06/07/2022 17:07    SIGNED: Deatra James, MD, FHM. FAAFP. [facility] - [facility] Time spent > 35 min.  In seeing, evaluating and examining the patient. Reviewing medical records, labs, drawn plan of care. [facility],  Pager (please use [email or web address] to page/  text) Please use Epic Secure Chat for non-urgent communication (7AM-7PM)  If 7PM-7AM, please contact night-coverage [email or web address], 06/08/2022, 10:45 AM

## 2022-06-08 NOTE — Hospital Course (Signed)
Joanna Sutton is a 67 y.o. female with medical history significant for hypertension, depression.  Patient reports lower abdominal pain, vomiting and diarrhea that started 3 days ago- on thursday 2/28.  She went to see her primary care provider, had a CT and was diagnosed with pancolitis, was prescribed a course of ciprofloxacin and metronidazole which she started taking afternoon of 3/1 and has been compliant with.  She reports multiple episodes of vomiting, with watery stools.   Hospitalized 12/17 to 12/30 for abdominal abscess/descending and sigmoid mild colitis.  Initially started on Rocephin and Flagyl, with uptrending WBC, antibiotics were switched to meropenem 12/24 for 5 days and then back to rocephin and Flagyl.  Patient was transferred to [facility] for placement of left transgluteal drain by IR- 12/28, aspiration yielded 10 mL of cloudy fluid and debris.  Cultures yielded no growth.  GI pathogen panel, stool C. difficile negative.   ED Course: Initial hypotension blood pressure down to 86/45.  WBC 10.  Creatinine elevated at 3.76.  ESR 55.  Lactic acid 1.5 >  1.8.   Stool  C. difficile negative.  GI pathogen panel pending. CT abdomen and pelvis without contrast-pancolitis progressed since prior CT.  Small pocket of extraluminal air adjacent to sigmoid colon consistent with focally contained microperforation. Blood cultures obtained Started on ceftriaxone and metronidazole. - EDP talked to general surgery, IV antibiotics, bowel rest, IV fluids for now. Dr. Arnoldo Morale is familiar with patient, and will be contacted to see patient in a.m.

## 2022-06-08 NOTE — Progress Notes (Signed)
Mobility Specialist Progress Note:   06/08/22 1125  Mobility  Activity Transferred from bed to chair  Level of Assistance Minimal assist, patient does 75% or more  Assistive Device Other (Comment) (HHA)  Distance Ambulated (ft) 4 ft  Activity Response Tolerated well  Mobility Referral Yes  $Mobility charge 1 Mobility   Pt was agreeable to mobility session. Performed orthostatics, see below for stats. Deferred in room ambulation, pt states feeling weak d/t diet change. Transferred pt to chair with call bell in reach, all needs met.   Supine: 111/42 (62) Sitting EOB: 130/58 (87) Standing (78mn) : 136/64 (85) EOS (in chair) 138/52 (75)  KRoyetta CrochetMobility Specialist Please contact via [facility]  Rehab office at [phone]

## 2022-06-08 NOTE — Consult Note (Signed)
Gastroenterology Consult   Referring Provider: Dr. Constance Haw  Primary Care Physician:  Glenda Chroman, MD Primary Gastroenterologist:  Dr. Jenetta Downer   Patient ID: Joanna Sutton; [id number]; 05-21-1955   Admit date: 06/07/2022  LOS: 1 day   Date of Consultation: 06/08/2022  Reason for Consultation:  Colitis with focally contained microperforation  History of Present Illness   Joanna Sutton is a 67 y.o. year old female with a history of GERD, dysphagia, depression, HLD, HTN, PUD, diarrhea, new onset colitis in Dec [id number] complicated by abscesses and requiring percutaneous drainage, concern for ileitis 03/28/22, returning yesterday with recurrent abdominal pain, N/V, and diarrhea.   In the ED: WBC count normal, Hgb 12.7, Na 131, Potassium 3.2, BUN 54, creatinine 3.76, lipase normal, sed rate 55, CRP 36.6, blood cultures negative thus far, lactic acid normal, Cdiff and GI path negative. CT abd/pelvis without contrast noting pancolitis, progressed since Apr 02, 2022, small pocket of extraluminal air adjacent to the sigmoid consistent with focally contained microperforation, no abscess. Dilated loops of small bowel, likely ileus.   She was seen as outpatient in Jan 2024 and doing well. Plans had been for colonoscopy around April 2024. She has a history of a large polyp in Oct 2023.    Came back in with acute onset of severe lower abdominal pain, associated n/v and diarrhea. Diarrhea resolved. N/V resolved. She has just been started on clear liquids.  No postprandial abdominal pain. No rectal bleeding. No fever/chills. Has been taking BC powders on a daily basis for headaches.          Colonoscopy 01/13/22: -15-18m polyp ascending colon (sessile serrated) -3 polyps in transverse and ascending colon s/p tattoo (tubular adenomas) -sigmoid and ascending diverticulosis.  -non bleeding internal hemorrhoids -Repeat TCS in April 2024   EGD 01/13/22: -white nummular lesions in esophagus  (biopsy with mild chronic esophagitis) -1cm hiatal hernia -Non obstructing shatzki ring s/p dilation -Hill grade IV GE flap -non bleeding gastric ulcer with clean base, normal biopsy -normal duodenum.     Past Medical History:  Diagnosis Date   Depression    Diarrhea    Diverticulitis    GERD (gastroesophageal reflux disease)    High cholesterol    Hypertension     Past Surgical History:  Procedure Laterality Date   APPENDECTOMY     AUGMENTATION MAMMAPLASTY     BIOPSY  01/13/2022   Procedure: BIOPSY;  Surgeon: CHarvel Quale MD;  [facility]: [facility];  Service: Gastroenterology;;   BREAST ENHANCEMENT SURGERY     CESAREAN SECTION     CHOLECYSTECTOMY     COLONOSCOPY WITH PROPOFOL N/A 01/13/2022   Procedure: COLONOSCOPY WITH PROPOFOL;  Surgeon: CHarvel Quale MD;  Location: [facility];  Service: Gastroenterology;  Laterality: N/A;  1030 am ASA 2   ESOPHAGEAL DILATION N/A 09/27/2014   Procedure: ESOPHAGEAL DILATION;  Surgeon: NRogene Houston MD;  Location: [facility];  Service: Endoscopy;  Laterality: N/A;   ESOPHAGOGASTRODUODENOSCOPY N/A 09/27/2014   Procedure: ESOPHAGOGASTRODUODENOSCOPY (EGD);  Surgeon: NRogene Houston MD;  Location: [facility];  Service: Endoscopy;  Laterality: N/A;  930   ESOPHAGOGASTRODUODENOSCOPY N/A 01/03/2015   Procedure: ESOPHAGOGASTRODUODENOSCOPY (EGD);  Surgeon: NRogene Houston MD;  [facility]: [facility];  Service: Endoscopy;  Laterality: N/A;  300   ESOPHAGOGASTRODUODENOSCOPY (EGD) WITH PROPOFOL N/A 01/13/2022   Procedure: ESOPHAGOGASTRODUODENOSCOPY (EGD) WITH PROPOFOL;  Surgeon: CHarvel Quale MD;  [facility]: [facility];  Service: Gastroenterology;  Laterality:  N/A;   NASAL SINUS SURGERY     PATELLA FRACTURE SURGERY Left    around 2016   POLYPECTOMY  01/13/2022   Procedure: POLYPECTOMY;  Surgeon: Harvel Quale, MD;  Location: [facility];  Service: Gastroenterology;;   SUBMUCOSAL  LIFTING INJECTION  01/13/2022   Procedure: SUBMUCOSAL LIFTING INJECTION;  Surgeon: Harvel Quale, MD;  Location: [facility];  Service: Gastroenterology;;   SUBMUCOSAL TATTOO INJECTION  01/13/2022   Procedure: SUBMUCOSAL TATTOO INJECTION;  Surgeon: Harvel Quale, MD;  Location: [facility];  Service: Gastroenterology;;    Prior to Admission medications   Medication Sig Start Date End Date Taking? Authorizing Provider  acetaminophen (TYLENOL) 500 MG tablet Take 500 mg by mouth every 6 (six) hours as needed for pain.   Yes [provider]  atorvastatin (LIPITOR) 40 MG tablet Take 40 mg by mouth daily.   Yes [provider]  cetirizine (ZYRTEC) 10 MG tablet Take 10 mg by mouth daily.   Yes [provider]  cyanocobalamin (VITAMIN B12) 1000 MCG tablet Take 2,000 mcg by mouth daily.   Yes [provider]  escitalopram (LEXAPRO) 10 MG tablet Take 10 mg by mouth daily.   Yes [provider]  lisinopril (ZESTRIL) 10 MG tablet Take 10 mg by mouth daily.   Yes [provider]  pantoprazole (PROTONIX) 40 MG tablet Take 1 tablet (40 mg total) by mouth 2 (two) times daily before a meal. Patient taking differently: Take 40 mg by mouth daily. 09/27/14  Yes Rehman, Mechele Dawley, MD    Current Facility-Administered Medications  Medication Dose Route Frequency Provider Last Rate Last Admin   0.9 % NaCl with KCl 40 mEq / L  infusion   Intravenous Continuous Emokpae, Ejiroghene E, MD 100 mL/hr at 06/08/22 0656 New Bag at 06/08/22 0656   acetaminophen (TYLENOL) tablet 650 mg  650 mg Oral Q6H PRN Emokpae, Ejiroghene E, MD       Or   acetaminophen (TYLENOL) suppository 650 mg  650 mg Rectal Q6H PRN Emokpae, Ejiroghene E, MD       heparin injection 5,000 Units  5,000 Units Subcutaneous Q8H Emokpae, Ejiroghene E, MD   5,000 Units at 06/08/22 0519   meropenem (MERREM) 1 g in sodium chloride 0.9 % 100 mL IVPB  1 g Intravenous Q12H Shahmehdi,  Seyed A, MD       morphine (PF) 2 MG/ML injection 2 mg  2 mg Intravenous Q4H PRN Emokpae, Ejiroghene E, MD   2 mg at 06/08/22 0329   promethazine (PHENERGAN) 6.25 mg in sodium chloride 0.9 % 50 mL IVPB  6.25 mg Intravenous Q6H PRN Zierle-Ghosh, Asia B, DO 200 mL/hr at 06/07/22 2107 6.25 mg at 06/07/22 2107    Allergies as of 06/07/2022 - Review Complete 06/07/2022  Allergen Reaction Noted   Bactrim [sulfamethoxazole-trimethoprim] Hives 11/18/2021   Levaquin [levofloxacin] Nausea And Vomiting 09/24/2014   Penicillins Rash 11/29/2012    Family History  Problem Relation Age of Onset   Leukemia Father    Cirrhosis Brother        non alcholic cirrhosis, fatty liver    Social History   Socioeconomic History   Marital status: Divorced    Spouse name: Not on file   Number of children: Not on file   Years of education: Not on file   Highest education level: Not on file  Occupational History   Not on file  Tobacco Use   Smoking status: Never  Passive exposure: Never   Smokeless tobacco: Never  Vaping Use   Vaping Use: Never used  Substance and Sexual Activity   Alcohol use: No   Drug use: No   Sexual activity: Never  Other Topics Concern   Not on file  Social History Narrative   Not on file   Social Determinants of Health   Financial Resource Strain: Not on file  Food Insecurity: No Food Insecurity (06/07/2022)   Hunger Vital Sign    Worried About Running Out of Food in the Last Year: Never true    Ran Out of Food in the Last Year: Never true  Transportation Needs: No Transportation Needs (06/07/2022)   PRAPARE - Hydrologist (Medical): No    Lack of Transportation (Non-Medical): No  Physical Activity: Not on file  Stress: Not on file  Social Connections: Not on file  Intimate Partner Violence: Not At Risk (06/07/2022)   Humiliation, Afraid, Rape, and Kick questionnaire    Fear of Current or Ex-Partner: No    Emotionally Abused: No     Physically Abused: No    Sexually Abused: No     Review of Systems   Gen: Denies any fever, chills, loss of appetite, change in weight or weight loss CV: Denies chest pain, heart palpitations, syncope, edema  Resp: Denies shortness of breath with rest, cough, wheezing, coughing up blood, and pleurisy. GI: Denies vomiting blood, jaundice, and fecal incontinence.   Denies dysphagia or odynophagia. GU : Denies urinary burning, blood in urine, urinary frequency, and urinary incontinence. MS: Denies joint pain, limitation of movement, swelling, cramps, and atrophy.  Derm: Denies rash, itching, dry skin, hives. Psych: Denies depression, anxiety, memory loss, hallucinations, and confusion. Heme: Denies bruising or bleeding Neuro:  Denies any headaches, dizziness, paresthesias, shaking  Physical Exam   Vital Signs in last 24 hours: Temp:  [98 F (36.7 C)-99 F (37.2 C)] 98.2 F (36.8 C) (03/04 0849) Pulse Rate:  [59-88] 71 (03/04 0849) Resp:  [16-22] 18 (03/04 0849) BP: (86-122)/(45-57) 100/49 (03/04 0849) SpO2:  [96 %-100 %] 96 % (03/04 0849) Weight:  [59 kg] 59 kg (03/03 1514) Last BM Date : 06/07/22  General:   Alert,  Well-developed, well-nourished, pleasant and cooperative in NAD, sitting in chair Head:  Normocephalic and atraumatic. Eyes:  Sclera clear, no icterus.   Lungs:  Clear throughout to auscultation.    Heart:  S1 S2 present  Abdomen:  Soft, TTP diffusely and nondistended. No masses, hepatosplenomegaly or hernias noted. Normal bowel sounds, without guarding, and without rebound.   Rectal: deferred   Msk:  Symmetrical without gross deformities. Normal posture. Extremities:  Without edema. Neurologic:  Alert and  oriented x4. Skin:  Intact without significant lesions or rashes. Psych:  Alert and cooperative. Normal mood and affect.  Intake/Output from previous day: 03/03 0701 - 03/04 0700 In: 800.8 [I.V.:650.8; IV Piggyback:150] Out: 600 [Urine:600] Intake/Output  this shift: No intake/output data recorded.    Labs/Studies   Recent Labs Recent Labs    06/07/22 1540 06/08/22 0412  WBC 10.0 9.1  HGB 12.7 10.6*  HCT 38.6 32.2*  PLT 242 224   BMET Recent Labs    06/07/22 1540 06/08/22 0412  NA 131* 135  K 3.2* 4.0  CL 98 108  CO2 18* 19*  GLUCOSE 112* 77  BUN 54* 34*  CREATININE 3.76* 1.77*  CALCIUM 8.7* 7.9*   LFT Recent Labs    06/07/22 1540  PROT  6.8  ALBUMIN 3.3*  AST 15  ALT 12  ALKPHOS 47  BILITOT 0.5    C-Diff Recent Labs    06/07/22 1840  CDIFFTOX NEGATIVE    Radiology/Studies CT ABDOMEN PELVIS WO CONTRAST  Result Date: 06/07/2022 CLINICAL DATA:  Left lower quadrant abdominal pain. Recent diagnosis of pancolitis. EXAM: CT ABDOMEN AND PELVIS WITHOUT CONTRAST TECHNIQUE: Multidetector CT imaging of the abdomen and pelvis was performed following the standard protocol without IV contrast. RADIATION DOSE REDUCTION: This exam was performed according to the departmental dose-optimization program which includes automated exposure control, adjustment of the mA and/or kV according to patient size and/or use of iterative reconstruction technique. COMPARISON:  CT abdomen pelvis dated 06/05/2022. FINDINGS: Evaluation of this exam is limited in the absence of intravenous contrast. Lower chest: Bibasilar linear atelectasis. There is mild eventration of the right hemidiaphragm. Small pocket of extraluminal air (47/2) and trace mesenteric edema and free fluid. Hepatobiliary: The liver is unremarkable. No biliary dilatation. Cholecystectomy. No retained calcified stone noted in the central CBD. Pancreas: Unremarkable. No pancreatic ductal dilatation or surrounding inflammatory changes. Spleen: Normal in size without focal abnormality. Adrenals/Urinary Tract: The adrenal glands are unremarkable. There is no hydronephrosis or nephrolithiasis on either side. Small left renal cyst. The visualized ureters and urinary bladder appear  unremarkable. Stomach/Bowel: Diffuse thickening and inflammatory changes of the colon, progressed since the prior CT and most severely involving the descending and sigmoid colon consistent with pancolitis. Small pocket of air adjacent to the sigmoid colon (coronal 42/5) consistent with a focally contained microperforation. No drainable fluid collection or abscess. There is a small hiatal hernia. There is dilatation of several loops of small bowel measuring up to approximately 4 cm, likely ileus. Developing obstruction is less likely but not excluded. Appendectomy. Vascular/Lymphatic: Mild aortoiliac atherosclerotic disease. The IVC is unremarkable. No portal venous gas. There is no adenopathy. Reproductive: The uterus is poorly visualized.  No adnexal masses. Other: Small fat containing umbilical hernia. Musculoskeletal: Partially visualized left breast implant with calcified capsule. Degenerative changes of the spine and scoliosis. No acute osseous pathology. IMPRESSION: 1. Pancolitis, progressed since the prior CT. Small pocket of extraluminal air adjacent to the sigmoid colon consistent with a focally contained microperforation. No drainable fluid collection or abscess. 2. Dilated loops of small bowel, likely ileus. Developing obstruction is not excluded. 3.  Aortic Atherosclerosis (ICD10-I70.0). Electronically Signed   By: Anner Crete M.D.   On: 06/07/2022 17:07     Assessment  67 y.o. year old female with a history of GERD, dysphagia, depression, HLD, HTN, PUD, diarrhea, new onset colitis in Dec [id number] complicated by abscesses and requiring percutaneous drainage, concern for ileitis 03/28/22, returning yesterday with recurrent abdominal pain, N/V, and diarrhea.   Now admitted with pancolitis and concern for focally contained microperforation. Notably, she had been doing well as of Jan 2024 at outpatient visit without abdominal pain, diarrhea, N/V. Now with acute presentation again. Cdiff and GI  pathogen panel negative. She has an interesting presentation and does endorse BC powders on a daily basis for headaches. Differentials including NSAID-induced colitis but unable to exclude new onset IBD. She ultimately needs a colonoscopy, which had already been recommended previously due to large polyp. Can consider this admission vs in 4 weeks after supportive measures. Clinically, she is improving today although remains tender.     Plan / Recommendations    Appreciate Surgery consultation May start clear liquids Evaluate again in the morning; consider colonoscopy this admission. Would also consider  EGD at time of colonoscopy as does have history of PUD (EGD Oct 2023).  Start PPI daily Continue IV antibiotics Absolute avoidance of NSAIDs/aspirin powders     06/08/2022, 9:03 AM  Annitta Needs, PhD, [facility] Gastroenterology

## 2022-06-08 NOTE — Consult Note (Signed)
[facility] Consult  Reason for Consult: Pancolitis, microperforation  Referring Physician:  Dr. Jamesetta Geralds   Chief Complaint   Emesis     HPI: Joanna Sutton is a 67 y.o. female with a history of cologuard positive stool this past year a colonoscopy in 01/2022 that showed some polyps and plan for repeat colonoscopy in April per the GI report. She had a history of colitis in the descending an sigmoid colon in December and some inflammatory changes in the small bowel at that time with an abscess. She was treated with antibiotics and an IR drain. She now comes in with signs of pancolitis and a microperforation. She has a history of NSAID abuse and gastric ulcers and says she has been using 3+ BC powders a day for her chronic back pain.   There was some question if this was diverticulitis in December with the perforation since she had diverticula in the area.   She has been C Dif negative I the past and this screening test was negative. She reports watery stools but no obvious mucus or blood.   Past Medical History:  Diagnosis Date   Depression    Diarrhea    Diverticulitis    GERD (gastroesophageal reflux disease)    High cholesterol    Hypertension     Past Surgical History:  Procedure Laterality Date   APPENDECTOMY     AUGMENTATION MAMMAPLASTY     BIOPSY  01/13/2022   Procedure: BIOPSY;  Surgeon: Harvel Quale, MD;  [facility];  Service: Gastroenterology;;   BREAST ENHANCEMENT SURGERY     CESAREAN SECTION     CHOLECYSTECTOMY     COLONOSCOPY WITH PROPOFOL N/A 01/13/2022   Procedure: COLONOSCOPY WITH PROPOFOL;  Surgeon: Harvel Quale, MD;  [facility];  Service: Gastroenterology;  Laterality: N/A;  1030 am ASA 2   ESOPHAGEAL DILATION N/A 09/27/2014   Procedure: ESOPHAGEAL DILATION;  Surgeon: Rogene [place], MD;  [facility];  Service: Endoscopy;  Laterality: N/A;   ESOPHAGOGASTRODUODENOSCOPY N/A  09/27/2014   Procedure: ESOPHAGOGASTRODUODENOSCOPY (EGD);  Surgeon: Rogene [place], MD;  [facility];  Service: Endoscopy;  Laterality: N/A;  930   ESOPHAGOGASTRODUODENOSCOPY N/A 01/03/2015   Procedure: ESOPHAGOGASTRODUODENOSCOPY (EGD);  Surgeon: Rogene [place], MD;  [facility];  Service: Endoscopy;  Laterality: N/A;  300   ESOPHAGOGASTRODUODENOSCOPY (EGD) WITH PROPOFOL N/A 01/13/2022   Procedure: ESOPHAGOGASTRODUODENOSCOPY (EGD) WITH PROPOFOL;  Surgeon: Harvel Quale, MD;  [facility];  Service: Gastroenterology;  Laterality: N/A;   NASAL SINUS SURGERY     PATELLA FRACTURE SURGERY Left    around 2016   POLYPECTOMY  01/13/2022   Procedure: POLYPECTOMY;  Surgeon: Harvel Quale, MD;  [facility];  Service: Gastroenterology;;   SUBMUCOSAL LIFTING INJECTION  01/13/2022   Procedure: SUBMUCOSAL LIFTING INJECTION;  Surgeon: Harvel Quale, MD;  [facility];  Service: Gastroenterology;;   SUBMUCOSAL TATTOO INJECTION  01/13/2022   Procedure: SUBMUCOSAL TATTOO INJECTION;  Surgeon: Harvel Quale, MD;  [facility];  Service: Gastroenterology;;    Family History  Problem Relation Age of Onset   Leukemia Father    Cirrhosis Brother        non alcholic cirrhosis, fatty liver    Social History   Tobacco Use   Smoking status: Never    Passive exposure: Never   Smokeless tobacco: Never  Vaping Use   Vaping Use: Never  used  Substance Use Topics   Alcohol use: No   Drug use: No    Medications: I have reviewed the patient's current medications. Prior to Admission:  Medications Prior to Admission  Medication Sig Dispense Refill Last Dose   acetaminophen (TYLENOL) 500 MG tablet Take 500 mg by mouth every 6 (six) hours as needed for pain.   unk   atorvastatin (LIPITOR) 40 MG tablet Take 40 mg by mouth daily.   Past Week   cetirizine (ZYRTEC) 10 MG tablet Take 10 mg by mouth  daily.   Past Week   cyanocobalamin (VITAMIN B12) 1000 MCG tablet Take 2,000 mcg by mouth daily.   Past Week   escitalopram (LEXAPRO) 10 MG tablet Take 10 mg by mouth daily.   Past Week   lisinopril (ZESTRIL) 10 MG tablet Take 10 mg by mouth daily.   Past Week   pantoprazole (PROTONIX) 40 MG tablet Take 1 tablet (40 mg total) by mouth 2 (two) times daily before a meal. (Patient taking differently: Take 40 mg by mouth daily.) 60 tablet 3 Past Week   Scheduled:  heparin  5,000 Units Subcutaneous Q8H   Continuous:  0.9 % NaCl with KCl 40 mEq / L 100 mL/hr at 06/08/22 0656   meropenem (MERREM) IV 1 g (06/08/22 1059)   promethazine (PHENERGAN) injection (IM or IVPB) 6.25 mg (06/07/22 2107)   [id number] **OR** acetaminophen, morphine injection, promethazine (PHENERGAN) injection (IM or IVPB)  Allergies  Allergen Reactions   Bactrim [Sulfamethoxazole-Trimethoprim] Hives   Levaquin [Levofloxacin] Nausea And Vomiting   Penicillins Rash     ROS:  A comprehensive review of systems was negative except for: Gastrointestinal: positive for abdominal pain and diarrhea  Blood pressure (!) 102/50, pulse 67, temperature 99 F (37.2 C), temperature source Oral, resp. rate 20, height '5\' 1"'$  (1.549 m), weight 59 kg, SpO2 98 %. Physical Exam Vitals reviewed.  Constitutional:      Appearance: Normal appearance.  HENT:     Head: Normocephalic.     Nose: Nose normal.  Eyes:     Extraocular Movements: Extraocular movements intact.  Cardiovascular:     Rate and Rhythm: Normal rate.  Pulmonary:     Effort: Pulmonary effort is normal.  Abdominal:     General: There is distension.     Palpations: Abdomen is soft.     Tenderness: There is abdominal tenderness. There is no guarding or rebound.  Musculoskeletal:        General: Normal range of motion.  Skin:    General: Skin is warm.  Neurological:     General: No focal deficit present.     Mental Status: She is alert and oriented to person,  place, and time.  Psychiatric:        Mood and Affect: Mood normal.        Behavior: Behavior normal.        Thought Content: Thought content normal.     Results: Results for orders placed or performed during the hospital encounter of 06/07/22 (from the past 48 hour(s))  Blood culture (routine x 2)     Status: None (Preliminary result)   Collection Time: 06/07/22  3:21 PM   Specimen: Right Antecubital; Blood  Result Value Ref Range   Specimen Description      RIGHT ANTECUBITAL BOTTLES DRAWN AEROBIC AND ANAEROBIC   Special Requests      Blood Culture results may not be optimal due to an excessive volume of blood received in culture  bottles   Culture      NO GROWTH < 12 HOURS Performed at [facility], [address], [place], [place] [address]    Report Status PENDING   Resp panel by RT-PCR (RSV, Flu A&B, Covid) Anterior Nasal Swab     Status: None   Collection Time: 06/07/22  3:35 PM   Specimen: Anterior Nasal Swab  Result Value Ref Range   SARS Coronavirus 2 by RT PCR NEGATIVE NEGATIVE    Comment: (NOTE) SARS-CoV-2 target nucleic acids are NOT DETECTED.  The SARS-CoV-2 RNA is generally detectable in upper respiratory specimens during the acute phase of infection. The lowest concentration of SARS-CoV-2 viral copies this assay can detect is 138 copies/mL. A negative result does not preclude SARS-Cov-2 infection and should not be used as the sole basis for treatment or other patient management decisions. A negative result may occur with  improper specimen collection/handling, submission of specimen other than nasopharyngeal swab, presence of viral mutation(s) within the areas targeted by this assay, and inadequate number of viral copies(<138 copies/mL). A negative result must be combined with clinical observations, patient history, and epidemiological information. The expected result is Negative.  Fact Sheet for Patients:  EntrepreneurPulse.com.au  Fact  Sheet for Healthcare Providers:  IncredibleEmployment.be  This test is no t yet approved or cleared by the [place] FDA and  has been authorized for detection and/or diagnosis of SARS-CoV-2 by FDA under an Emergency Use Authorization (EUA). This EUA will remain  in effect (meaning this test can be used) for the duration of the COVID-19 declaration under Section 564(b)(1) of the Act, 21 U.S.C.section 360bbb-3(b)(1), unless the authorization is terminated  or revoked sooner.       Influenza A by PCR NEGATIVE NEGATIVE   Influenza B by PCR NEGATIVE NEGATIVE    Comment: (NOTE) The Xpert Xpress SARS-CoV-2/FLU/RSV plus assay is intended as an aid in the diagnosis of influenza from Nasopharyngeal swab specimens and should not be used as a sole basis for treatment. Nasal washings and aspirates are unacceptable for Xpert Xpress SARS-CoV-2/FLU/RSV testing.  Fact Sheet for Patients: EntrepreneurPulse.com.au  Fact Sheet for Healthcare Providers: IncredibleEmployment.be  This test is not yet approved or cleared by the [place] FDA and has been authorized for detection and/or diagnosis of SARS-CoV-2 by FDA under an Emergency Use Authorization (EUA). This EUA will remain in effect (meaning this test can be used) for the duration of the COVID-19 declaration under Section 564(b)(1) of the Act, 21 U.S.C. section 360bbb-3(b)(1), unless the authorization is terminated or revoked.     Resp Syncytial Virus by PCR NEGATIVE NEGATIVE    Comment: (NOTE) Fact Sheet for Patients: EntrepreneurPulse.com.au  Fact Sheet for Healthcare Providers: IncredibleEmployment.be  This test is not yet approved or cleared by the [place] FDA and has been authorized for detection and/or diagnosis of SARS-CoV-2 by FDA under an Emergency Use Authorization (EUA). This EUA will remain in effect (meaning this test can  be used) for the duration of the COVID-19 declaration under Section 564(b)(1) of the Act, 21 U.S.C. section 360bbb-3(b)(1), unless the authorization is terminated or revoked.  Performed at [facility], [address] [place], [place] [address]   CBC with Differential     Status: Abnormal   Collection Time: 06/07/22  3:40 PM  Result Value Ref Range   WBC 10.0 4.0 - 10.5 K/uL   RBC 4.17 3.87 - 5.11 MIL/uL   Hemoglobin 12.7 12.0 - 15.0 g/dL   HCT 38.6 36.0 - 46.0 %  MCV 92.6 80.0 - 100.0 fL   MCH 30.5 26.0 - 34.0 pg   MCHC 32.9 30.0 - 36.0 g/dL   RDW 13.6 11.5 - 15.5 %   Platelets 242 150 - 400 K/uL   nRBC 0.0 0.0 - 0.2 %   Neutrophils Relative % 85 %   Neutro Abs 8.6 (H) 1.7 - 7.7 K/uL   Lymphocytes Relative 7 %   Lymphs Abs 0.7 0.7 - 4.0 K/uL   Monocytes Relative 7 %   Monocytes Absolute 0.7 0.1 - 1.0 K/uL   Eosinophils Relative 0 %   Eosinophils Absolute 0.0 0.0 - 0.5 K/uL   Basophils Relative 1 %   Basophils Absolute 0.1 0.0 - 0.1 K/uL   Immature Granulocytes 0 %   Abs Immature Granulocytes 0.03 0.00 - 0.07 K/uL    Comment: Performed at [facility], [address] [place], [place] [address]  Comprehensive metabolic panel     Status: Abnormal   Collection Time: 06/07/22  3:40 PM  Result Value Ref Range   Sodium 131 (L) 135 - 145 mmol/L   Potassium 3.2 (L) 3.5 - 5.1 mmol/L   Chloride 98 98 - 111 mmol/L   CO2 18 (L) 22 - 32 mmol/L   Glucose, Bld 112 (H) 70 - 99 mg/dL    Comment: Glucose reference range applies only to samples taken after fasting for at least 8 hours.   BUN 54 (H) 8 - 23 mg/dL   Creatinine, Ser 3.76 (H) 0.44 - 1.00 mg/dL   Calcium 8.7 (L) 8.9 - 10.3 mg/dL   Total Protein 6.8 6.5 - 8.1 g/dL   Albumin 3.3 (L) 3.5 - 5.0 g/dL   AST 15 15 - 41 U/L   ALT 12 0 - 44 U/L   Alkaline Phosphatase 47 38 - 126 U/L   Total Bilirubin 0.5 0.3 - 1.2 mg/dL   GFR, Estimated 13 (L) >60 mL/min    Comment: (NOTE) Calculated using the CKD-EPI Creatinine Equation (2021)     Anion gap 15 5 - 15    Comment: Performed at [facility], [address] [place], [place] [address]  Lipase, blood     Status: None   Collection Time: 06/07/22  3:40 PM  Result Value Ref Range   Lipase 28 11 - 51 U/L    Comment: Performed at [facility], [address], [place], [place] [address]  Sedimentation rate     Status: Abnormal   Collection Time: 06/07/22  3:40 PM  Result Value Ref Range   Sed Rate 55 (H) 0 - 22 mm/hr    Comment: Performed at [facility], [address], [place], [place] [address]  Lactic acid, plasma     Status: None   Collection Time: 06/07/22  3:40 PM  Result Value Ref Range   Lactic Acid, Venous 1.5 0.5 - 1.9 mmol/L    Comment: Performed at [facility], [address], [place], [place] [address]  Magnesium     Status: None   Collection Time: 06/07/22  3:40 PM  Result Value Ref Range   Magnesium 2.2 1.7 - 2.4 mg/dL    Comment: Performed at [facility], [address], [place], [place] [address]  C-reactive protein     Status: Abnormal   Collection Time: 06/07/22  4:00 PM  Result Value Ref Range   CRP 36.6 (H) <1.0 mg/dL    Comment: Performed at [facility] [address], [place], [place] [address]  Blood culture (routine  x 2)     Status: None (Preliminary result)   Collection Time: 06/07/22  4:00 PM   Specimen: BLOOD LEFT ARM  Result Value Ref Range   Specimen Description BLOOD LEFT ARM BOTTLES DRAWN AEROBIC AND ANAEROBIC    Special Requests Blood Culture adequate volume    Culture      NO GROWTH < 12 HOURS Performed at [facility], [address], [place], [place] [address]    Report Status PENDING   Lactic acid, plasma     Status: None   Collection Time: 06/07/22  5:37 PM  Result Value Ref Range   Lactic Acid, Venous 1.8 0.5 - 1.9 mmol/L    Comment: Performed at [facility], [address] [place], [place] [address]  C Difficile Quick Screen w PCR reflex     Status: None   Collection Time: 06/07/22  6:40 PM   Specimen: Stool   Result Value Ref Range   C Diff antigen NEGATIVE NEGATIVE   C Diff toxin NEGATIVE NEGATIVE   C Diff interpretation No C. difficile detected.     Comment: Performed at [facility], [address] [place], [place] [id number]  Basic metabolic panel     Status: Abnormal   Collection Time: 06/08/22  4:12 AM  Result Value Ref Range   Sodium 135 135 - 145 mmol/L   Potassium 4.0 3.5 - 5.1 mmol/L    Comment: DELTA CHECK NOTED   Chloride 108 98 - 111 mmol/L   CO2 19 (L) 22 - 32 mmol/L   Glucose, Bld 77 70 - 99 mg/dL    Comment: Glucose reference range applies only to samples taken after fasting for at least 8 hours.   BUN 34 (H) 8 - 23 mg/dL   Creatinine, Ser 1.77 (H) 0.44 - 1.00 mg/dL    Comment: DELTA CHECK NOTED   Calcium 7.9 (L) 8.9 - 10.3 mg/dL   GFR, Estimated 31 (L) >60 mL/min    Comment: (NOTE) Calculated using the CKD-EPI Creatinine Equation (2021)    Anion gap 8 5 - 15    Comment: Performed at [facility], [address], [place], [place] [address]  CBC     Status: Abnormal   Collection Time: 06/08/22  4:12 AM  Result Value Ref Range   WBC 9.1 4.0 - 10.5 K/uL   RBC 3.43 (L) 3.87 - 5.11 MIL/uL   Hemoglobin 10.6 (L) 12.0 - 15.0 g/dL   HCT 32.2 (L) 36.0 - 46.0 %   MCV 93.9 80.0 - 100.0 fL   MCH 30.9 26.0 - 34.0 pg   MCHC 32.9 30.0 - 36.0 g/dL   RDW 13.7 11.5 - 15.5 %   Platelets 224 150 - 400 K/uL   nRBC 0.0 0.0 - 0.2 %    Comment: Performed at [facility], [address] [place],  [address]   Personally reviewed and reviewed old imaging- small foci of air, colon with thickening throughout CT ABDOMEN PELVIS WO CONTRAST  Result Date: 06/07/2022 CLINICAL DATA:  Left lower quadrant abdominal pain. Recent diagnosis of pancolitis. EXAM: CT ABDOMEN AND PELVIS WITHOUT CONTRAST TECHNIQUE: Multidetector CT imaging of the abdomen and pelvis was performed following the standard protocol without IV contrast. RADIATION DOSE REDUCTION: This exam was performed according to the  departmental dose-optimization program which includes automated exposure control, adjustment of the mA and/or kV according to patient size and/or use of iterative reconstruction technique. COMPARISON:  CT abdomen pelvis dated 06/05/2022. FINDINGS: Evaluation of this exam is  limited in the absence of intravenous contrast. Lower chest: Bibasilar linear atelectasis. There is mild eventration of the right hemidiaphragm. Small pocket of extraluminal air (47/2) and trace mesenteric edema and free fluid. Hepatobiliary: The liver is unremarkable. No biliary dilatation. Cholecystectomy. No retained calcified stone noted in the central CBD. Pancreas: Unremarkable. No pancreatic ductal dilatation or surrounding inflammatory changes. Spleen: Normal in size without focal abnormality. Adrenals/Urinary Tract: The adrenal glands are unremarkable. There is no hydronephrosis or nephrolithiasis on either side. Small left renal cyst. The visualized ureters and urinary bladder appear unremarkable. Stomach/Bowel: Diffuse thickening and inflammatory changes of the colon, progressed since the prior CT and most severely involving the descending and sigmoid colon consistent with pancolitis. Small pocket of air adjacent to the sigmoid colon (coronal 42/5) consistent with a focally contained microperforation. No drainable fluid collection or abscess. There is a small hiatal hernia. There is dilatation of several loops of small bowel measuring up to approximately 4 cm, likely ileus. Developing obstruction is less likely but not excluded. Appendectomy. Vascular/Lymphatic: Mild aortoiliac atherosclerotic disease. The IVC is unremarkable. No portal venous gas. There is no adenopathy. Reproductive: The uterus is poorly visualized.  No adnexal masses. Other: Small fat containing umbilical hernia. Musculoskeletal: Partially visualized left breast implant with calcified capsule. Degenerative changes of the spine and scoliosis. No acute osseous  pathology. IMPRESSION: 1. Pancolitis, progressed since the prior CT. Small pocket of extraluminal air adjacent to the sigmoid colon consistent with a focally contained microperforation. No drainable fluid collection or abscess. 2. Dilated loops of small bowel, likely ileus. Developing obstruction is not excluded. 3.  Aortic Atherosclerosis (ICD10-I70.0). Electronically Signed   By: Anner Crete M.D.   On: 06/07/2022 17:07     Assessment & Plan:  TERSEA MOSHER is a 67 y.o. female with concern for pancolitis in the setting of prior colitis and small bowel thickening in December. I do not know what is causing her colitis is at this time but she should be worked up by GI to confirm this is not Crohn's or some infectious etiology or related to her NSAID use.   Microperforation minimal, no abscess to drain Recommend stopping NSAIDs GI to evaluate No acute surgical intervention needed at this time Antibiotics for microperforation for now Can have a diet of  clears if GI feels like she is appropriate   All questions were answered to the satisfaction of the patient.     Virl Cagey 06/08/2022, 8:28 AM

## 2022-06-08 NOTE — Progress Notes (Signed)
Patient arrived to room 301 around 1906. She is alert and oriented x4 and able to make needs known. Oriented patient to unit, room, and call bell. She c/o pain 7/10 and Nausea. PRN medications given as ordered and are effective. Patient rested during this shift.

## 2022-06-08 NOTE — Care Management Important Message (Signed)
Important Message  Patient Details  Name: SUELLYN SARACINO MRN: [id number] Date of Birth: September 04, 1955   Medicare Important Message Given:  N/A - LOS <3 / Initial given by admissions     Tommy Medal 06/08/2022, 4:12 PM

## 2022-06-09 DIAGNOSIS — K529 Noninfective gastroenteritis and colitis, unspecified: Secondary | ICD-10-CM

## 2022-06-09 DIAGNOSIS — K5100 Ulcerative (chronic) pancolitis without complications: Secondary | ICD-10-CM | POA: Diagnosis not present

## 2022-06-09 LAB — BASIC METABOLIC PANEL
Anion gap: 6 (ref 5–15)
BUN: 14 mg/dL (ref 8–23)
CO2: 20 mmol/L — ABNORMAL LOW (ref 22–32)
Calcium: 8 mg/dL — ABNORMAL LOW (ref 8.9–10.3)
Chloride: 110 mmol/L (ref 98–111)
Creatinine, Ser: 0.8 mg/dL (ref 0.44–1.00)
GFR, Estimated: >60 mL/min (ref >60)
Glucose, Bld: 84 mg/dL (ref 70–99)
Potassium: 4 mmol/L (ref 3.5–5.1)
Sodium: 136 mmol/L (ref 135–145)

## 2022-06-09 LAB — CBC
HCT: 30.7 % — ABNORMAL LOW (ref 36.0–46.0)
Hemoglobin: 9.7 g/dL — ABNORMAL LOW (ref 12.0–15.0)
MCH: 30.6 pg (ref 26.0–34.0)
MCHC: 31.6 g/dL (ref 30.0–36.0)
MCV: 96.8 fL (ref 80.0–100.0)
Platelets: 208 10*3/uL (ref 150–400)
RBC: 3.17 MIL/uL — ABNORMAL LOW (ref 3.87–5.11)
RDW: 14 % (ref 11.5–15.5)
WBC: 6.9 10*3/uL (ref 4.0–10.5)
nRBC: 0 % (ref 0.0–0.2)

## 2022-06-09 MED ORDER — SODIUM CHLORIDE 0.9 % IV SOLN
1.0000 g | Freq: Three times a day (TID) | INTRAVENOUS | Status: DC
  Administered 2022-06-09 – 2022-06-10 (×4): 1 g via INTRAVENOUS
  Filled 2022-06-09 (×3): qty 20

## 2022-06-09 NOTE — Progress Notes (Signed)
Patient rested well during this shift. No BM during this shift. She c/o abdominal pain and headache at the beginning of the shift. Administered PRN morphine and tylenol as ordered. Medications are effective.

## 2022-06-09 NOTE — Progress Notes (Signed)
Mobility Specialist Progress Note:    06/09/22 0906  Mobility  Activity Transferred from bed to chair;Transferred to/from [facility]  Level of Assistance Standby assist, set-up cues, supervision of patient - no hands on  Assistive Device Front wheel walker  Distance Ambulated (ft) 25 ft  Activity Response Tolerated well  Mobility Referral Yes  $Mobility charge 1 Mobility   Pt was agreeable to mobility session. Pt c/o right hand swelling and stomach pain prior to session. Ambulated to [facility] in bathroom, had one bowel movement, and transferred to chair. Required SB for safety via RW d/t pt stating she felt weak. Tolerated session well, asx throughout.  Left pt in chair, call bell in reach, all needs met and MD in room. Notified nursing staff.   Royetta Crochet Mobility Specialist Please contact via Solicitor or  Rehab office at [phone]

## 2022-06-09 NOTE — Progress Notes (Addendum)
[facility] Progress Note     Subjective: Still sore and distended. Having Bms. GI considering colonoscopy.   Objective: Vital signs in last 24 hours: Temp:  [97.4 F (36.3 C)-98.2 F (36.8 C)] 98.2 F (36.8 C) (03/05 0920) Pulse Rate:  [58-68] 66 (03/05 0920) Resp:  [18-22] 19 (03/05 0920) BP: (121-136)/(46-65) 136/58 (03/05 0920) SpO2:  [96 %-99 %] 99 % (03/05 0920) Last BM Date : 06/07/22  Intake/Output from previous day: 03/04 0701 - 03/05 0700 In: 1470.9 [P.O.:360; I.V.:1010.9; IV Piggyback:100] Out: 1850 [Urine:1850] Intake/Output this shift: Total I/O In: 200 [P.O.:200] Out: 250 [Urine:250]  General appearance: alert and no distress GI: soft, distended, mild tenderness with palpation, no rebound or guarding   Lab Results:  Recent Labs    06/08/22 0412 06/09/22 0350  WBC 9.1 6.9  HGB 10.6* 9.7*  HCT 32.2* 30.7*  PLT 224 208   BMET Recent Labs    06/08/22 0412 06/09/22 0350  NA 135 136  K 4.0 4.0  CL 108 110  CO2 19* 20*  GLUCOSE 77 84  BUN 34* 14  CREATININE 1.77* 0.80  CALCIUM 7.9* 8.0*   PT/INR No results for input(s): "LABPROT", "INR" in the last 72 hours.  Studies/Results: CT ABDOMEN PELVIS WO CONTRAST  Result Date: 06/07/2022 CLINICAL DATA:  Left lower quadrant abdominal pain. Recent diagnosis of pancolitis. EXAM: CT ABDOMEN AND PELVIS WITHOUT CONTRAST TECHNIQUE: Multidetector CT imaging of the abdomen and pelvis was performed following the standard protocol without IV contrast. RADIATION DOSE REDUCTION: This exam was performed according to the departmental dose-optimization program which includes automated exposure control, adjustment of the mA and/or kV according to patient size and/or use of iterative reconstruction technique. COMPARISON:  CT abdomen pelvis dated 06/05/2022. FINDINGS: Evaluation of this exam is limited in the absence of intravenous contrast. Lower chest: Bibasilar linear atelectasis. There is mild eventration  of the right hemidiaphragm. Small pocket of extraluminal air (47/2) and trace mesenteric edema and free fluid. Hepatobiliary: The liver is unremarkable. No biliary dilatation. Cholecystectomy. No retained calcified stone noted in the central CBD. Pancreas: Unremarkable. No pancreatic ductal dilatation or surrounding inflammatory changes. Spleen: Normal in size without focal abnormality. Adrenals/Urinary Tract: The adrenal glands are unremarkable. There is no hydronephrosis or nephrolithiasis on either side. Small left renal cyst. The visualized ureters and urinary bladder appear unremarkable. Stomach/Bowel: Diffuse thickening and inflammatory changes of the colon, progressed since the prior CT and most severely involving the descending and sigmoid colon consistent with pancolitis. Small pocket of air adjacent to the sigmoid colon (coronal 42/5) consistent with a focally contained microperforation. No drainable fluid collection or abscess. There is a small hiatal hernia. There is dilatation of several loops of small bowel measuring up to approximately 4 cm, likely ileus. Developing obstruction is less likely but not excluded. Appendectomy. Vascular/Lymphatic: Mild aortoiliac atherosclerotic disease. The IVC is unremarkable. No portal venous gas. There is no adenopathy. Reproductive: The uterus is poorly visualized.  No adnexal masses. Other: Small fat containing umbilical hernia. Musculoskeletal: Partially visualized left breast implant with calcified capsule. Degenerative changes of the spine and scoliosis. No acute osseous pathology. IMPRESSION: 1. Pancolitis, progressed since the prior CT. Small pocket of extraluminal air adjacent to the sigmoid colon consistent with a focally contained microperforation. No drainable fluid collection or abscess. 2. Dilated loops of small bowel, likely ileus. Developing obstruction is not excluded. 3.  Aortic Atherosclerosis (ICD10-I70.0). Electronically Signed   By: Anner Crete M.D.   On: 06/07/2022 17:07  Anti-infectives: Anti-infectives (From admission, onward)    Start     Dose/Rate Route Frequency Ordered Stop   06/09/22 1000  meropenem (MERREM) 1 g in sodium chloride 0.9 % 100 mL IVPB        1 g 200 mL/hr over 30 Minutes Intravenous Every 8 hours 06/09/22 0836     06/08/22 1000  meropenem (MERREM) 1 g in sodium chloride 0.9 % 100 mL IVPB  Status:  Discontinued        1 g 200 mL/hr over 30 Minutes Intravenous Every 12 hours 06/08/22 0850 06/09/22 0836   06/07/22 2200  meropenem (MERREM) 500 mg in sodium chloride 0.9 % 100 mL IVPB  Status:  Discontinued        500 mg 200 mL/hr over 30 Minutes Intravenous Every 12 hours 06/07/22 2110 06/08/22 0850   06/07/22 1730  cefTRIAXone (ROCEPHIN) 2 g in sodium chloride 0.9 % 100 mL IVPB        2 g 200 mL/hr over 30 Minutes Intravenous  Once 06/07/22 1719 06/07/22 2056   06/07/22 1730  metroNIDAZOLE (FLAGYL) IVPB 500 mg  Status:  Discontinued        500 mg 100 mL/hr over 60 Minutes Intravenous Every 12 hours 06/07/22 1719 06/07/22 2100       Assessment/Plan: Patient with pancolitis of unknown etiology, a foci of gas from microperforation, abdominal exam reassuring. Tolerating some clears, having Bms. Needs worked up for colitis and etiology NSAIDs, IBD?   Will defer plans for colonoscopy and diet to GI No acute surgical intervention indicated  Updated patient and team.    LOS: 2 days    Virl Cagey 06/09/2022

## 2022-06-09 NOTE — Progress Notes (Signed)
Subjective: Improving slowly. Abdominal pain 5/10 in severity. Tolerating clear liquids well. Wondering if she can advance her diet a little. Had 1 watery brown BM this morning. No BM yesterday. No nausea, vomiting. No brbpr, melena.   Objective: Vital signs in last 24 hours: Temp:  [97.4 F (36.3 C)-98.2 F (36.8 C)] 98.2 F (36.8 C) (03/05 0401) Pulse Rate:  [58-71] 58 (03/05 0401) Resp:  [18-22] 18 (03/05 0401) BP: (100-136)/(46-65) 121/65 (03/05 0401) SpO2:  [96 %-99 %] 97 % (03/05 0401) Last BM Date : 06/07/22 General:   Alert and oriented, pleasant, NAD.  Head:  Normocephalic and atraumatic. Eyes:  No icterus, sclera clear. Conjuctiva pink.  Abdomen:  Bowel sounds present. Abdomen is full but soft. Diffuse moderate TTP. non-tender, non-distended. No rebound or guarding. No masses appreciated  Msk:  Symmetrical without gross deformities. Normal posture. Extremities:  Without edema. Neurologic:  Alert and  oriented x4;  grossly normal neurologically. Skin:  Warm and dry, intact without significant lesions.  Cervical Nodes:  No significant cervical adenopathy. Psych: Normal mood and affect.  Intake/Output from previous day: 03/04 0701 - 03/05 0700 In: 1470.9 [P.O.:360; I.V.:1010.9; IV Piggyback:100] Out: 1850 [Urine:1850] Intake/Output this shift: No intake/output data recorded.  Lab Results: Recent Labs    06/07/22 1540 06/08/22 0412 06/09/22 0350  WBC 10.0 9.1 6.9  HGB 12.7 10.6* 9.7*  HCT 38.6 32.2* 30.7*  PLT 242 224 208   BMET Recent Labs    06/07/22 1540 06/08/22 0412 06/09/22 0350  NA 131* 135 136  K 3.2* 4.0 4.0  CL 98 108 110  CO2 18* 19* 20*  GLUCOSE 112* 77 84  BUN 54* 34* 14  CREATININE 3.76* 1.77* 0.80  CALCIUM 8.7* 7.9* 8.0*   LFT Recent Labs    06/07/22 1540  PROT 6.8  ALBUMIN 3.3*  AST 15  ALT 12  ALKPHOS 47  BILITOT 0.5   Studies/Results: CT ABDOMEN PELVIS WO CONTRAST  Result Date: 06/07/2022 CLINICAL DATA:  Left lower  quadrant abdominal pain. Recent diagnosis of pancolitis. EXAM: CT ABDOMEN AND PELVIS WITHOUT CONTRAST TECHNIQUE: Multidetector CT imaging of the abdomen and pelvis was performed following the standard protocol without IV contrast. RADIATION DOSE REDUCTION: This exam was performed according to the departmental dose-optimization program which includes automated exposure control, adjustment of the mA and/or kV according to patient size and/or use of iterative reconstruction technique. COMPARISON:  CT abdomen pelvis dated 06/05/2022. FINDINGS: Evaluation of this exam is limited in the absence of intravenous contrast. Lower chest: Bibasilar linear atelectasis. There is mild eventration of the right hemidiaphragm. Small pocket of extraluminal air (47/2) and trace mesenteric edema and free fluid. Hepatobiliary: The liver is unremarkable. No biliary dilatation. Cholecystectomy. No retained calcified stone noted in the central CBD. Pancreas: Unremarkable. No pancreatic ductal dilatation or surrounding inflammatory changes. Spleen: Normal in size without focal abnormality. Adrenals/Urinary Tract: The adrenal glands are unremarkable. There is no hydronephrosis or nephrolithiasis on either side. Small left renal cyst. The visualized ureters and urinary bladder appear unremarkable. Stomach/Bowel: Diffuse thickening and inflammatory changes of the colon, progressed since the prior CT and most severely involving the descending and sigmoid colon consistent with pancolitis. Small pocket of air adjacent to the sigmoid colon (coronal 42/5) consistent with a focally contained microperforation. No drainable fluid collection or abscess. There is a small hiatal hernia. There is dilatation of several loops of small bowel measuring up to approximately 4 cm, likely ileus. Developing obstruction is less likely but not  excluded. Appendectomy. Vascular/Lymphatic: Mild aortoiliac atherosclerotic disease. The IVC is unremarkable. No portal venous  gas. There is no adenopathy. Reproductive: The uterus is poorly visualized.  No adnexal masses. Other: Small fat containing umbilical hernia. Musculoskeletal: Partially visualized left breast implant with calcified capsule. Degenerative changes of the spine and scoliosis. No acute osseous pathology. IMPRESSION: 1. Pancolitis, progressed since the prior CT. Small pocket of extraluminal air adjacent to the sigmoid colon consistent with a focally contained microperforation. No drainable fluid collection or abscess. 2. Dilated loops of small bowel, likely ileus. Developing obstruction is not excluded. 3.  Aortic Atherosclerosis (ICD10-I70.0). Electronically Signed   By: Anner Crete M.D.   On: 06/07/2022 17:07    Assessment: 67 y.o. year old female with a history of GERD, dysphagia, depression, HLD, HTN, PUD, diarrhea, new onset colitis in Dec 2023 with concern for ileitis as well with possible partial small bowel obstruction, complicated by abscesses and requiring percutaneous drainage, returning 06/07/22 with recurrent abdominal pain, N/V, and diarrhea found to have pancolitis on CT, progressed since prior CT with small focally contained microperforation, dilated loops of small bowel suspected to be ileus, but unable to exclude developing obstruction.  Pancolitis:  With microperforation, dilated loops of small bowel suspected to be ileus, but unable to exclude developing obstruction. Notably, she had been doing well as of Jan 2024 at outpatient visit without abdominal pain, diarrhea, N/V. Now with acute presentation again. Cdiff and GI pathogen panel negative. She has endorsed BC powders on a daily basis for headaches.  She has been on broad-spectrum empiric IV antibiotics with meropenem since admission.  Clinically she is improving slowly.  Etiology is unclear. Differential includes NSAID-induced colitis, unable to exclude IBD. Ultimately she is going to need a colonoscopy for further evaluation. Will  discuss timing with Dr. Jenetta Downer.     Anemia:  Hgb 12.7 day of admission >> 10.6 >> 9.7 today. No overt GI bleeding. Possibly dilutional in the setting of IV fluids. Unable to rule out occult GI bleeding in the setting of colitis. Also with history of PUD in October 2023. Taking PPI daily outpatient and using BC daily.    Plan: Discuss inpatient vs outpatient colonoscopy with Dr. Jenetta Downer.  Consider adding EGD at the same time as colonoscopy to follow-up on PUD.   Continue clear liquid diet for now. If no colonoscopy this admission, can try advancing to full liquid.  Continue empiric antibiotics.  Continue PPI daily. Strict NSAID avoidance    LOS: 2 days    06/09/2022, 7:58 AM   Aliene Altes, PA-C [facility]

## 2022-06-09 NOTE — Progress Notes (Signed)
Patient out of bed to chair this am, c/o minimal abdominal discomfort, asked the patient if she would like anything for pain, refused pain medication at this time.Tolerated diet this am with no issues. Call bell within reach. Plan of care on going.

## 2022-06-09 NOTE — Progress Notes (Signed)
PROGRESS NOTE    Patient: Joanna Sutton                            PCP: Glenda Chroman, MD                    DOB: 1955/09/27            DOA: 06/07/2022 [id number]             DOS: 06/09/2022, 11:37 AM   LOS: 2 days   Date of Service: The patient was seen and examined on 06/09/2022  Subjective:   The patient was seen and examined. Hemodynamically stable Reporting of 1 episode of minimal BM -per patient diarrhea -   Tolerable abdominal pain with analgesics No issues overnight .  Brief Narrative:   Joanna Sutton is a 67 y.o. female with medical history significant for hypertension, depression.  Patient reports lower abdominal pain, vomiting and diarrhea that started 3 days ago- on thursday 2/28.  She went to see her primary care provider, had a CT and was diagnosed with pancolitis, was prescribed a course of ciprofloxacin and metronidazole which she started taking afternoon of 3/1 and has been compliant with.  She reports multiple episodes of vomiting, with watery stools.   Hospitalized 12/17 to 12/30 for abdominal abscess/descending and sigmoid mild colitis.  Initially started on Rocephin and Flagyl, with uptrending WBC, antibiotics were switched to meropenem 12/24 for 5 days and then back to rocephin and Flagyl.  Patient was transferred to [facility] for placement of left transgluteal drain by IR- 12/28, aspiration yielded 10 mL of cloudy fluid and debris.  Cultures yielded no growth.  GI pathogen panel, stool C. difficile negative.   ED Course: Initial hypotension blood pressure down to 86/45.  WBC 10.  Creatinine elevated at 3.76.  ESR 55.  Lactic acid 1.5 >  1.8.   Stool  C. difficile negative.  GI pathogen panel pending. CT abdomen and pelvis without contrast-pancolitis progressed since prior CT.  Small pocket of extraluminal air adjacent to sigmoid colon consistent with focally contained microperforation. Blood cultures obtained Started on ceftriaxone and metronidazole. - EDP  talked to general surgery, IV antibiotics, bowel rest, IV fluids for now. Dr. Arnoldo Morale is familiar with patient, and will be contacted to see patient in a.m.    Assessment & Plan:   Principal Problem:   Pancolitis ([place]) Active Problems:   AKI (acute kidney injury) ([place])   Essential hypertension, benign   Hypokalemia     Assessment and Plan: * Pancolitis (HCC)-with microperforation -Improving abdominal pain, improving vomiting 1 episode of diarrhea this morning  -Presenting with multiple episodes of vomiting and diarrhea x 3 days.  Has received just about 2 days of oral antibiotics.  -CT showing progression of pancolitis with focally contained microperforation.   No drainable fluid collection or abscess.  Likely ileus.  - In December hospitalized also for colitis and abscess.  WBC uptrending on empiric antibiotics, hence was switched to meropenem, with improvement in symptoms and leukocytosis, transferred to [facility], IR placed drain, unfortunately cultures did not yield any growth (see History section for details), patient had been on antibiotics. -C. difficile negative -GI pathogen panel  -Follow-up blood cultures >>> - Cont.  IV meropenem - [email or web address]  - Gen, surgery on-call, Dr. Renold Genta familiar over this patient  -Surgery has signed off no surgical invention needed  -Following closely with gastroenterologist recommendation:  Currently recommending to start clear liquid diet and advance slowly continue antibiotics, continue PPI  Anticipating to proceed with colonoscopy and EGD   -Continue workup to rule out inflammatory bowel disease (such as Crohn's disease versus ulcerative colitis)  AKI (acute kidney injury) ([place]) -Improving Cr- Elevated 3.76.  Baseline 0.5-0.6 -Continue to hydrate -Hold Lisinopril Lab Results  Component Value Date   CREATININE 0.80 06/09/2022   CREATININE 1.77 (H) 06/08/2022   CREATININE 3.76 (H) 06/07/2022     Hypokalemia Potassium  3.2. >>  4.0, 4.0 Mag  2.2. QTC prolonged-529. - Replete k.  Essential hypertension, benign Initial was hypotension , systolic down to 86.  Likely from dehydration. -Stabilizing -Hold lisinopril 10 mg      --------------------------------------------------------------------------------------------------------------------  DVT prophylaxis:  heparin injection 5,000 Units Start: 06/07/22 2200   Code Status:   Code Status: Full Code  Family Communication: No family member present, discussed with The above findings and plan of care has been discussed with patient (and family)  in detail,  they expressed understanding and agreement of above. -Advance care planning has been discussed.   Admission status:   Status is: Inpatient Remains inpatient appropriate because: Needing IV fluids, IV   Disposition: From  - home             Planning for discharge in 1-2 days: to Home  With close follow-up with gastroenterologist  Procedures:   No admission procedures for hospital encounter.   Antimicrobials:  Anti-infectives (From admission, onward)    Start     Dose/Rate Route Frequency Ordered Stop   06/09/22 1000  meropenem (MERREM) 1 g in sodium chloride 0.9 % 100 mL IVPB        1 g 200 mL/hr over 30 Minutes Intravenous Every 8 hours 06/09/22 0836     06/08/22 1000  meropenem (MERREM) 1 g in sodium chloride 0.9 % 100 mL IVPB  Status:  Discontinued        1 g 200 mL/hr over 30 Minutes Intravenous Every 12 hours 06/08/22 0850 06/09/22 0836   06/07/22 2200  meropenem (MERREM) 500 mg in sodium chloride 0.9 % 100 mL IVPB  Status:  Discontinued        500 mg 200 mL/hr over 30 Minutes Intravenous Every 12 hours 06/07/22 2110 06/08/22 0850   06/07/22 1730  cefTRIAXone (ROCEPHIN) 2 g in sodium chloride 0.9 % 100 mL IVPB        2 g 200 mL/hr over 30 Minutes Intravenous  Once 06/07/22 1719 06/07/22 2056   06/07/22 1730  metroNIDAZOLE (FLAGYL) IVPB 500 mg  Status:  Discontinued        500  mg 100 mL/hr over 60 Minutes Intravenous Every 12 hours 06/07/22 1719 06/07/22 2100        Medication:   heparin  5,000 Units Subcutaneous Q8H   pantoprazole  40 mg Oral QAC breakfast    acetaminophen **OR** acetaminophen, morphine injection, promethazine (PHENERGAN) injection (IM or IVPB)   Objective:   Vitals:   06/08/22 1440 06/08/22 2057 06/09/22 0401 06/09/22 0920  BP: (!) 136/46 131/65 121/65 (!) 136/58  Pulse: 65 68 (!) 58 66  Resp: (!) '22 18 18 19  '$ Temp: (!) 97.4 F (36.3 C) 98.2 F (36.8 C) 98.2 F (36.8 C) 98.2 F (36.8 C)  TempSrc: Oral   Oral  SpO2: 99% 96% 97% 99%  Weight:      Height:        Intake/Output Summary (Last 24 hours) at 06/09/2022 1137  Last data filed at 06/09/2022 [id number] Gross per 24 hour  Intake 1670.86 ml  Output 2100 ml  Net -429.14 ml   Filed Weights   06/07/22 1514  Weight: 59 kg     Physical examination:    General:  AAO x 3,  cooperative, no distress;   HEENT:  Normocephalic, PERRL, otherwise with in Normal limits   Neuro:  CNII-XII intact. , normal motor and sensation, reflexes intact   Lungs:   Clear to auscultation BL, Respirations unlabored,  No wheezes / crackles  Cardio:    S1/S2, RRR, No murmure, No Rubs or Gallops   Abdomen:  Soft, diffuse mild-moderate tenderness bowel sounds active all four quadrants, no guarding or peritoneal signs.  Muscular  skeletal:  Limited exam -global generalized weaknesses - in bed, able to move all 4 extremities,   2+ pulses,  symmetric, No pitting edema  Skin:  Dry, warm to touch, negative for any Rashes,  Wounds: Please see nursing documentation        -------------------------------------------------------------------------------------------------------------------------    LABs:     Latest Ref Rng & Units 06/09/2022    3:50 AM 06/08/2022    4:12 AM 06/07/2022    3:40 PM  CBC  WBC 4.0 - 10.5 K/uL 6.9  9.1  10.0   Hemoglobin 12.0 - 15.0 g/dL 9.7  10.6  12.7   Hematocrit 36.0 -  46.0 % 30.7  32.2  38.6   Platelets 150 - 400 K/uL 208  224  242       Latest Ref Rng & Units 06/09/2022    3:50 AM 06/08/2022    4:12 AM 06/07/2022    3:40 PM  CMP  Glucose 70 - 99 mg/dL 84  77  112   BUN 8 - 23 mg/dL 14  34  54   Creatinine 0.44 - 1.00 mg/dL 0.80  1.77  3.76   Sodium 135 - 145 mmol/L 136  135  131   Potassium 3.5 - 5.1 mmol/L 4.0  4.0  3.2   Chloride 98 - 111 mmol/L 110  108  98   CO2 22 - 32 mmol/L '20  19  18   '$ Calcium 8.9 - 10.3 mg/dL 8.0  7.9  8.7   Total Protein 6.5 - 8.1 g/dL   6.8   Total Bilirubin 0.3 - 1.2 mg/dL   0.5   Alkaline Phos 38 - 126 U/L   47   AST 15 - 41 U/L   15   ALT 0 - 44 U/L   12        Micro Results Recent Results (from the past 240 hour(s))  Blood culture (routine x 2)     Status: None (Preliminary result)   Collection Time: 06/07/22  3:21 PM   Specimen: Right Antecubital; Blood  Result Value Ref Range Status   Specimen Description   Final    RIGHT ANTECUBITAL BOTTLES DRAWN AEROBIC AND ANAEROBIC   Special Requests   Final    Blood Culture results may not be optimal due to an excessive volume of blood received in culture bottles   Culture   Final    NO GROWTH < 12 HOURS Performed at [facility], [address] [place], [place] [address]    Report Status PENDING  Incomplete  Resp panel by RT-PCR (RSV, Flu A&B, Covid) Anterior Nasal Swab     Status: None   Collection Time: 06/07/22  3:35 PM   Specimen: Anterior Nasal Swab  Result Value  Ref Range Status   SARS Coronavirus 2 by RT PCR NEGATIVE NEGATIVE Final    Comment: (NOTE) SARS-CoV-2 target nucleic acids are NOT DETECTED.  The SARS-CoV-2 RNA is generally detectable in upper respiratory specimens during the acute phase of infection. The lowest concentration of SARS-CoV-2 viral copies this assay can detect is 138 copies/mL. A negative result does not preclude SARS-Cov-2 infection and should not be used as the sole basis for treatment or other patient management decisions. A  negative result may occur with  improper specimen collection/handling, submission of specimen other than nasopharyngeal swab, presence of viral mutation(s) within the areas targeted by this assay, and inadequate number of viral copies(<138 copies/mL). A negative result must be combined with clinical observations, patient history, and epidemiological information. The expected result is Negative.  Fact Sheet for Patients:  EntrepreneurPulse.com.au  Fact Sheet for Healthcare Providers:  IncredibleEmployment.be  This test is no t yet approved or cleared by the [place] FDA and  has been authorized for detection and/or diagnosis of SARS-CoV-2 by FDA under an Emergency Use Authorization (EUA). This EUA will remain  in effect (meaning this test can be used) for the duration of the COVID-19 declaration under Section 564(b)(1) of the Act, 21 U.S.C.section 360bbb-3(b)(1), unless the authorization is terminated  or revoked sooner.       Influenza A by PCR NEGATIVE NEGATIVE Final   Influenza B by PCR NEGATIVE NEGATIVE Final    Comment: (NOTE) The Xpert Xpress SARS-CoV-2/FLU/RSV plus assay is intended as an aid in the diagnosis of influenza from Nasopharyngeal swab specimens and should not be used as a sole basis for treatment. Nasal washings and aspirates are unacceptable for Xpert Xpress SARS-CoV-2/FLU/RSV testing.  Fact Sheet for Patients: EntrepreneurPulse.com.au  Fact Sheet for Healthcare Providers: IncredibleEmployment.be  This test is not yet approved or cleared by the [place] FDA and has been authorized for detection and/or diagnosis of SARS-CoV-2 by FDA under an Emergency Use Authorization (EUA). This EUA will remain in effect (meaning this test can be used) for the duration of the COVID-19 declaration under Section 564(b)(1) of the Act, 21 U.S.C. section 360bbb-3(b)(1), unless the authorization  is terminated or revoked.     Resp Syncytial Virus by PCR NEGATIVE NEGATIVE Final    Comment: (NOTE) Fact Sheet for Patients: EntrepreneurPulse.com.au  Fact Sheet for Healthcare Providers: IncredibleEmployment.be  This test is not yet approved or cleared by the [place] FDA and has been authorized for detection and/or diagnosis of SARS-CoV-2 by FDA under an Emergency Use Authorization (EUA). This EUA will remain in effect (meaning this test can be used) for the duration of the COVID-19 declaration under Section 564(b)(1) of the Act, 21 U.S.C. section 360bbb-3(b)(1), unless the authorization is terminated or revoked.  Performed at [facility], [address], [place], [place] [address]   Blood culture (routine x 2)     Status: None (Preliminary result)   Collection Time: 06/07/22  4:00 PM   Specimen: BLOOD LEFT ARM  Result Value Ref Range Status   Specimen Description BLOOD LEFT ARM BOTTLES DRAWN AEROBIC AND ANAEROBIC  Final   Special Requests Blood Culture adequate volume  Final   Culture   Final    NO GROWTH < 12 HOURS Performed at [facility], [address], [place], [place] [address]    Report Status PENDING  Incomplete  Gastrointestinal Panel by PCR , Stool     Status: None   Collection Time: 06/07/22  6:40 PM   Specimen: Stool  Result Value Ref Range Status   Campylobacter species NOT DETECTED NOT DETECTED Final   Plesimonas shigelloides NOT DETECTED NOT DETECTED Final   Salmonella species NOT DETECTED NOT DETECTED Final   Yersinia enterocolitica NOT DETECTED NOT DETECTED Final   Vibrio species NOT DETECTED NOT DETECTED Final   Vibrio cholerae NOT DETECTED NOT DETECTED Final   Enteroaggregative E coli (EAEC) NOT DETECTED NOT DETECTED Final   Enteropathogenic E coli (EPEC) NOT DETECTED NOT DETECTED Final   Enterotoxigenic E coli (ETEC) NOT DETECTED NOT DETECTED Final   Shiga like toxin producing E coli (STEC) NOT DETECTED NOT  DETECTED Final   Shigella/Enteroinvasive E coli (EIEC) NOT DETECTED NOT DETECTED Final   Cryptosporidium NOT DETECTED NOT DETECTED Final   Cyclospora cayetanensis NOT DETECTED NOT DETECTED Final   Entamoeba histolytica NOT DETECTED NOT DETECTED Final   Giardia lamblia NOT DETECTED NOT DETECTED Final   Adenovirus F40/41 NOT DETECTED NOT DETECTED Final   Astrovirus NOT DETECTED NOT DETECTED Final   Norovirus GI/GII NOT DETECTED NOT DETECTED Final   Rotavirus A NOT DETECTED NOT DETECTED Final   Sapovirus (I, II, IV, and V) NOT DETECTED NOT DETECTED Final    Comment: Performed at [facility], [address] [place], [place] [address]  C Difficile Quick Screen w PCR reflex     Status: None   Collection Time: 06/07/22  6:40 PM   Specimen: Stool  Result Value Ref Range Status   C Diff antigen NEGATIVE NEGATIVE Final   C Diff toxin NEGATIVE NEGATIVE Final   C Diff interpretation No C. difficile detected.  Final    Comment: Performed at [facility], [address] [place], [place] [address]    Radiology Reports No results found.  SIGNED: Deatra James, MD, FHM. FAAFP. Zacarias Pontes - [facility] Time spent > 35 min.  In seeing, evaluating and examining the patient. Reviewing medical records, labs, drawn plan of care. [facility],  Pager (please use [email or web address] to page/ text) Please use Epic Secure Chat for non-urgent communication (7AM-7PM)  If 7PM-7AM, please contact night-coverage [email or web address], 06/09/2022, 11:37 AM

## 2022-06-10 ENCOUNTER — Telehealth (INDEPENDENT_AMBULATORY_CARE_PROVIDER_SITE_OTHER): Payer: Self-pay | Admitting: Gastroenterology

## 2022-06-10 DIAGNOSIS — K5100 Ulcerative (chronic) pancolitis without complications: Secondary | ICD-10-CM | POA: Diagnosis not present

## 2022-06-10 LAB — BASIC METABOLIC PANEL
Anion gap: 8 (ref 5–15)
BUN: 6 mg/dL — ABNORMAL LOW (ref 8–23)
CO2: 22 mmol/L (ref 22–32)
Calcium: 8.1 mg/dL — ABNORMAL LOW (ref 8.9–10.3)
Chloride: 106 mmol/L (ref 98–111)
Creatinine, Ser: 0.57 mg/dL (ref 0.44–1.00)
GFR, Estimated: >60 mL/min (ref >60)
Glucose, Bld: 79 mg/dL (ref 70–99)
Potassium: 3.9 mmol/L (ref 3.5–5.1)
Sodium: 136 mmol/L (ref 135–145)

## 2022-06-10 LAB — CBC
HCT: 33.3 % — ABNORMAL LOW (ref 36.0–46.0)
Hemoglobin: 10.8 g/dL — ABNORMAL LOW (ref 12.0–15.0)
MCH: 30.7 pg (ref 26.0–34.0)
MCHC: 32.4 g/dL (ref 30.0–36.0)
MCV: 94.6 fL (ref 80.0–100.0)
Platelets: 214 10*3/uL (ref 150–400)
RBC: 3.52 MIL/uL — ABNORMAL LOW (ref 3.87–5.11)
RDW: 13.4 % (ref 11.5–15.5)
WBC: 5.4 10*3/uL (ref 4.0–10.5)
nRBC: 0 % (ref 0.0–0.2)

## 2022-06-10 MED ORDER — METRONIDAZOLE 500 MG PO TABS: 500.0000 mg | ORAL_TABLET | Freq: Two times a day (BID) | ORAL | 0 refills | Status: AC

## 2022-06-10 MED ORDER — CIPROFLOXACIN HCL 500 MG PO TABS: 500.0000 mg | ORAL_TABLET | Freq: Two times a day (BID) | ORAL | 0 refills | Status: AC

## 2022-06-10 NOTE — Progress Notes (Signed)
Patient discharged home with instructions given on medications and follow up visits,patient verbalized understanding.Prescriptions sent to Pharmacy of choice documented on AVS. IV discontinued,catheter intact. Accompanied by staff to an awaiting vehicle.

## 2022-06-10 NOTE — Care Management Important Message (Signed)
Important Message  Patient Details  Name: Joanna Sutton MRN: [id number] Date of Birth: 1955-09-03   Medicare Important Message Given:  Yes     Tommy Medal 06/10/2022, 11:14 AM

## 2022-06-10 NOTE — Discharge Summary (Signed)
Physician Discharge Summary  TRILLIUM GUNNISON [id number] DOB: [birth year] DOA: 06/07/2022  PCP: Glenda Chroman, MD  Admit date: 06/07/2022  Discharge date: 06/10/2022  Admitted From:Home  Disposition:  Home  Recommendations for Outpatient Follow-up:  Follow up with PCP in 1-2 weeks Continue on Flagyl and ciprofloxacin as prescribed for 10 more days for complete treatment of colitis Follow-up with GI as recommended outpatient which will be scheduled in 6-8 weeks for outpatient colonoscopy Continue soft diet Avoid NSAIDs and continue PPI daily  Home Health: None  Equipment/Devices: None  Discharge Condition:Stable  CODE STATUS: Full  Diet recommendation: Soft diet  Brief/Interim Summary: Joanna Sutton is a 67 y.o. female with medical history significant for hypertension, depression.  Patient reports lower abdominal pain, vomiting and diarrhea that started 3 days ago- on thursday 2/28.  She went to see her primary care provider, had a CT and was diagnosed with pancolitis, was prescribed a course of ciprofloxacin and metronidazole which she started taking afternoon of 3/1 and has been compliant with.  She reports multiple episodes of vomiting, with watery stools.   Hospitalized 12/17 to 12/30 for abdominal abscess/descending and sigmoid mild colitis.  Initially started on Rocephin and Flagyl, with uptrending WBC, antibiotics were switched to meropenem 12/24 for 5 days and then back to rocephin and Flagyl.  Patient was transferred to [facility] for placement of left transgluteal drain by IR- 12/28, aspiration yielded 10 mL of cloudy fluid and debris.  Cultures yielded no growth.  GI pathogen panel, stool C. difficile negative.  She was admitted with pancolitis and microperforation noted.  She was started on IV meropenem empirically while hospitalized and stool studies had returned negative.  She was followed by GI as well as general surgery with no need for intervention noted at this time.   She is now tolerating diet with improved abdominal symptoms and will have outpatient follow-up for colonoscopy scheduled by GI in the next 6-8 weeks.  Recommendations are now to convert to oral ciprofloxacin and Flagyl for 10 more days to complete total 14-day course of treatment.  No other acute events or concerns noted.  Discharge Diagnoses:  Principal Problem:   Pancolitis ([place]) Active Problems:   AKI (acute kidney injury) ([place])   Essential hypertension, benign   Hypokalemia  Principal discharge diagnosis: Pancolitis with microperforation and associated prerenal AKI.  Discharge Instructions  Discharge Instructions     Diet - low sodium heart healthy   Complete by: As directed    Increase activity slowly   Complete by: As directed       Allergies as of 06/10/2022       Reactions   Bactrim [sulfamethoxazole-trimethoprim] Hives   Levaquin [levofloxacin] Nausea And Vomiting   Penicillins Rash        Medication List     TAKE these medications    acetaminophen 500 MG tablet Commonly known as: TYLENOL Take 500 mg by mouth every 6 (six) hours as needed for pain.   atorvastatin 40 MG tablet Commonly known as: LIPITOR Take 40 mg by mouth daily.   cetirizine 10 MG tablet Commonly known as: ZYRTEC Take 10 mg by mouth daily.   ciprofloxacin 500 MG tablet Commonly known as: Cipro Take 1 tablet (500 mg total) by mouth 2 (two) times daily for 10 days.   cyanocobalamin 1000 MCG tablet Commonly known as: VITAMIN B12 Take 2,000 mcg by mouth daily.   escitalopram 10 MG tablet Commonly known as: LEXAPRO Take 10 mg by mouth  daily.   lisinopril 10 MG tablet Commonly known as: ZESTRIL Take 10 mg by mouth daily.   metroNIDAZOLE 500 MG tablet Commonly known as: Flagyl Take 1 tablet (500 mg total) by mouth 2 (two) times daily for 10 days.   pantoprazole 40 MG tablet Commonly known as: PROTONIX Take 1 tablet (40 mg total) by mouth 2 (two) times daily before a meal. What  changed: when to take this        Follow-up [place], Dhruv B, MD. Schedule an appointment as soon as possible for a visit in 2 week(s).   Specialty: Internal Medicine Contact information: [place] [address] [phone]         [place]. Go in 6 week(s).   Contact information: [place] [address] [phone]               Allergies  Allergen Reactions   Bactrim [Sulfamethoxazole-Trimethoprim] Hives   Levaquin [Levofloxacin] Nausea And Vomiting   Penicillins Rash    Consultations: GI General surgery   Procedures/Studies: CT ABDOMEN PELVIS WO CONTRAST  Result Date: 06/07/2022 CLINICAL DATA:  Left lower quadrant abdominal pain. Recent diagnosis of pancolitis. EXAM: CT ABDOMEN AND PELVIS WITHOUT CONTRAST TECHNIQUE: Multidetector CT imaging of the abdomen and pelvis was performed following the standard protocol without IV contrast. RADIATION DOSE REDUCTION: This exam was performed according to the departmental dose-optimization program which includes automated exposure control, adjustment of the mA and/or kV according to patient size and/or use of iterative reconstruction technique. COMPARISON:  CT abdomen pelvis dated 06/05/2022. FINDINGS: Evaluation of this exam is limited in the absence of intravenous contrast. Lower chest: Bibasilar linear atelectasis. There is mild eventration of the right hemidiaphragm. Small pocket of extraluminal air (47/2) and trace mesenteric edema and free fluid. Hepatobiliary: The liver is unremarkable. No biliary dilatation. Cholecystectomy. No retained calcified stone noted in the central CBD. Pancreas: Unremarkable. No pancreatic ductal dilatation or surrounding inflammatory changes. Spleen: Normal in size without focal abnormality. Adrenals/Urinary Tract: The adrenal glands are unremarkable. There is no hydronephrosis or nephrolithiasis on either side. Small  left renal cyst. The visualized ureters and urinary bladder appear unremarkable. Stomach/Bowel: Diffuse thickening and inflammatory changes of the colon, progressed since the prior CT and most severely involving the descending and sigmoid colon consistent with pancolitis. Small pocket of air adjacent to the sigmoid colon (coronal 42/5) consistent with a focally contained microperforation. No drainable fluid collection or abscess. There is a small hiatal hernia. There is dilatation of several loops of small bowel measuring up to approximately 4 cm, likely ileus. Developing obstruction is less likely but not excluded. Appendectomy. Vascular/Lymphatic: Mild aortoiliac atherosclerotic disease. The IVC is unremarkable. No portal venous gas. There is no adenopathy. Reproductive: The uterus is poorly visualized.  No adnexal masses. Other: Small fat containing umbilical hernia. Musculoskeletal: Partially visualized left breast implant with calcified capsule. Degenerative changes of the spine and scoliosis. No acute osseous pathology. IMPRESSION: 1. Pancolitis, progressed since the prior CT. Small pocket of extraluminal air adjacent to the sigmoid colon consistent with a focally contained microperforation. No drainable fluid collection or abscess. 2. Dilated loops of small bowel, likely ileus. Developing obstruction is not excluded. 3.  Aortic Atherosclerosis (ICD10-I70.0). Electronically Signed   By: Anner Crete M.D.   On: 06/07/2022 17:07     Discharge Exam: Vitals:   06/10/22 0522 06/10/22 0823  BP: 112/74 132/62  Pulse: 79 75  Resp: 18 18  Temp: 98.1 F (36.7 C) 97.9 F (36.6 C)  SpO2: 98% 98%   Vitals:   06/09/22 1342 06/09/22 2119 06/10/22 0522 06/10/22 0823  BP: 132/70 (!) 124/51 112/74 132/62  Pulse: 67 (!) 59 79 75  Resp: '20 18 18 18  '$ Temp: 98.6 F (37 C) 98.1 F (36.7 C) 98.1 F (36.7 C) 97.9 F (36.6 C)  TempSrc: Oral   Tympanic  SpO2: 98% 97% 98% 98%  Weight:      Height:         General: Pt is alert, awake, not in acute distress Cardiovascular: RRR, S1/S2 +, no rubs, no gallops Respiratory: CTA bilaterally, no wheezing, no rhonchi Abdominal: Soft, NT, ND, bowel sounds + Extremities: no edema, no cyanosis    The results of significant diagnostics from this hospitalization (including imaging, microbiology, ancillary and laboratory) are listed below for reference.     Microbiology: Recent Results (from the past 240 hour(s))  Blood culture (routine x 2)     Status: None (Preliminary result)   Collection Time: 06/07/22  3:21 PM   Specimen: Right Antecubital; Blood  Result Value Ref Range Status   Specimen Description   Final    RIGHT ANTECUBITAL BOTTLES DRAWN AEROBIC AND ANAEROBIC   Special Requests   Final    Blood Culture results may not be optimal due to an excessive volume of blood received in culture bottles   Culture   Final    NO GROWTH 3 DAYS Performed at [facility], [address] [place], [place] [address]    Report Status PENDING  Incomplete  Resp panel by RT-PCR (RSV, Flu A&B, Covid) Anterior Nasal Swab     Status: None   Collection Time: 06/07/22  3:35 PM   Specimen: Anterior Nasal Swab  Result Value Ref Range Status   SARS Coronavirus 2 by RT PCR NEGATIVE NEGATIVE Final    Comment: (NOTE) SARS-CoV-2 target nucleic acids are NOT DETECTED.  The SARS-CoV-2 RNA is generally detectable in upper respiratory specimens during the acute phase of infection. The lowest concentration of SARS-CoV-2 viral copies this assay can detect is 138 copies/mL. A negative result does not preclude SARS-Cov-2 infection and should not be used as the sole basis for treatment or other patient management decisions. A negative result may occur with  improper specimen collection/handling, submission of specimen other than nasopharyngeal swab, presence of viral mutation(s) within the areas targeted by this assay, and inadequate number of viral copies(<138  copies/mL). A negative result must be combined with clinical observations, patient history, and epidemiological information. The expected result is Negative.  Fact Sheet for Patients:  EntrepreneurPulse.com.au  Fact Sheet for Healthcare Providers:  IncredibleEmployment.be  This test is no t yet approved or cleared by the [place] FDA and  has been authorized for detection and/or diagnosis of SARS-CoV-2 by FDA under an Emergency Use Authorization (EUA). This EUA will remain  in effect (meaning this test can be used) for the duration of the COVID-19 declaration under Section 564(b)(1) of the Act, 21 U.S.C.section 360bbb-3(b)(1), unless the authorization is terminated  or revoked sooner.       Influenza A by PCR NEGATIVE NEGATIVE Final   Influenza B by PCR NEGATIVE NEGATIVE Final    Comment: (NOTE) The Xpert Xpress SARS-CoV-2/FLU/RSV plus assay is intended as an aid in the diagnosis of influenza from Nasopharyngeal swab specimens and should not be used as a sole basis for treatment. Nasal washings and aspirates are unacceptable for Xpert Xpress SARS-CoV-2/FLU/RSV testing.  Fact Sheet for Patients: EntrepreneurPulse.com.au  Fact Sheet for Healthcare Providers: IncredibleEmployment.be  This test is not yet approved or cleared by the [place] FDA and has been authorized for detection and/or diagnosis of SARS-CoV-2 by FDA under an Emergency Use Authorization (EUA). This EUA will remain in effect (meaning this test can be used) for the duration of the COVID-19 declaration under Section 564(b)(1) of the Act, 21 U.S.C. section 360bbb-3(b)(1), unless the authorization is terminated or revoked.     Resp Syncytial Virus by PCR NEGATIVE NEGATIVE Final    Comment: (NOTE) Fact Sheet for Patients: EntrepreneurPulse.com.au  Fact Sheet for Healthcare  Providers: IncredibleEmployment.be  This test is not yet approved or cleared by the [place] FDA and has been authorized for detection and/or diagnosis of SARS-CoV-2 by FDA under an Emergency Use Authorization (EUA). This EUA will remain in effect (meaning this test can be used) for the duration of the COVID-19 declaration under Section 564(b)(1) of the Act, 21 U.S.C. section 360bbb-3(b)(1), unless the authorization is terminated or revoked.  Performed at [facility], [address] [place], [place] [address]   Blood culture (routine x 2)     Status: None (Preliminary result)   Collection Time: 06/07/22  4:00 PM   Specimen: BLOOD LEFT ARM  Result Value Ref Range Status   Specimen Description BLOOD LEFT ARM BOTTLES DRAWN AEROBIC AND ANAEROBIC  Final   Special Requests Blood Culture adequate volume  Final   Culture   Final    NO GROWTH 3 DAYS Performed at [facility], [address] [place], [place] [address]    Report Status PENDING  Incomplete  Gastrointestinal Panel by PCR , Stool     Status: None   Collection Time: 06/07/22  6:40 PM   Specimen: Stool  Result Value Ref Range Status   Campylobacter species NOT DETECTED NOT DETECTED Final   Plesimonas shigelloides NOT DETECTED NOT DETECTED Final   Salmonella species NOT DETECTED NOT DETECTED Final   Yersinia enterocolitica NOT DETECTED NOT DETECTED Final   Vibrio species NOT DETECTED NOT DETECTED Final   Vibrio cholerae NOT DETECTED NOT DETECTED Final   Enteroaggregative E coli (EAEC) NOT DETECTED NOT DETECTED Final   Enteropathogenic E coli (EPEC) NOT DETECTED NOT DETECTED Final   Enterotoxigenic E coli (ETEC) NOT DETECTED NOT DETECTED Final   Shiga like toxin producing E coli (STEC) NOT DETECTED NOT DETECTED Final   Shigella/Enteroinvasive E coli (EIEC) NOT DETECTED NOT DETECTED Final   Cryptosporidium NOT DETECTED NOT DETECTED Final   Cyclospora cayetanensis NOT DETECTED NOT DETECTED Final    Entamoeba histolytica NOT DETECTED NOT DETECTED Final   Giardia lamblia NOT DETECTED NOT DETECTED Final   Adenovirus F40/41 NOT DETECTED NOT DETECTED Final   Astrovirus NOT DETECTED NOT DETECTED Final   Norovirus GI/GII NOT DETECTED NOT DETECTED Final   Rotavirus A NOT DETECTED NOT DETECTED Final   Sapovirus (I, II, IV, and V) NOT DETECTED NOT DETECTED Final    Comment: Performed at [facility], [address] [place], [place] [address]  C Difficile Quick Screen w PCR reflex     Status: None   Collection Time: 06/07/22  6:40 PM   Specimen: Stool  Result Value Ref Range Status   C Diff antigen NEGATIVE NEGATIVE Final   C Diff toxin NEGATIVE NEGATIVE Final   C Diff interpretation No C. difficile detected.  Final    Comment: Performed at [facility], [address] [place], [place] [address]  Labs: BNP (last 3 results) No results for input(s): "BNP" in the last 8760 hours. Basic Metabolic Panel: Recent Labs  Lab 06/07/22 1540 06/08/22 0412 06/09/22 0350 06/10/22 0357  NA 131* 135 136 136  K 3.2* 4.0 4.0 3.9  CL 98 108 110 106  CO2 18* 19* 20* 22  GLUCOSE 112* 77 84 79  BUN 54* 34* 14 6*  CREATININE 3.76* 1.77* 0.80 0.57  CALCIUM 8.7* 7.9* 8.0* 8.1*  MG 2.2  --   --   --    Liver Function Tests: Recent Labs  Lab 06/07/22 1540  AST 15  ALT 12  ALKPHOS 47  BILITOT 0.5  PROT 6.8  ALBUMIN 3.3*   Recent Labs  Lab 06/07/22 1540  LIPASE 28   No results for input(s): "AMMONIA" in the last 168 hours. CBC: Recent Labs  Lab 06/07/22 1540 06/08/22 0412 06/09/22 0350 06/10/22 0357  WBC 10.0 9.1 6.9 5.4  NEUTROABS 8.6*  --   --   --   HGB 12.7 10.6* 9.7* 10.8*  HCT 38.6 32.2* 30.7* 33.3*  MCV 92.6 93.9 96.8 94.6  PLT 242 224 208 214   Cardiac Enzymes: No results for input(s): "CKTOTAL", "CKMB", "CKMBINDEX", "TROPONINI" in the last 168 hours. BNP: Invalid input(s): "POCBNP" CBG: No results for input(s): "GLUCAP" in the last 168  hours. D-Dimer No results for input(s): "DDIMER" in the last 72 hours. Hgb A1c No results for input(s): "HGBA1C" in the last 72 hours. Lipid Profile No results for input(s): "CHOL", "HDL", "LDLCALC", "TRIG", "CHOLHDL", "LDLDIRECT" in the last 72 hours. Thyroid function studies No results for input(s): "TSH", "T4TOTAL", "T3FREE", "THYROIDAB" in the last 72 hours.  Invalid input(s): "FREET3" Anemia work up No results for input(s): "VITAMINB12", "FOLATE", "FERRITIN", "TIBC", "IRON", "RETICCTPCT" in the last 72 hours. Urinalysis    Component Value Date/Time   COLORURINE YELLOW 03/22/2022 1152   APPEARANCEUR CLEAR 03/22/2022 1152   LABSPEC 1.026 03/22/2022 1152   PHURINE 5.0 03/22/2022 1152   GLUCOSEU NEGATIVE 03/22/2022 1152   HGBUR SMALL (A) 03/22/2022 1152   BILIRUBINUR NEGATIVE 03/22/2022 1152   KETONESUR 20 (A) 03/22/2022 1152   PROTEINUR 30 (A) 03/22/2022 1152   NITRITE NEGATIVE 03/22/2022 1152   LEUKOCYTESUR NEGATIVE 03/22/2022 1152   Sepsis Labs Recent Labs  Lab 06/07/22 1540 06/08/22 0412 06/09/22 0350 06/10/22 0357  WBC 10.0 9.1 6.9 5.4   Microbiology Recent Results (from the past 240 hour(s))  Blood culture (routine x 2)     Status: None (Preliminary result)   Collection Time: 06/07/22  3:21 PM   Specimen: Right Antecubital; Blood  Result Value Ref Range Status   Specimen Description   Final    RIGHT ANTECUBITAL BOTTLES DRAWN AEROBIC AND ANAEROBIC   Special Requests   Final    Blood Culture results may not be optimal due to an excessive volume of blood received in culture bottles   Culture   Final    NO GROWTH 3 DAYS Performed at [facility], [address], [place], [place] [address]    Report Status PENDING  Incomplete  Resp panel by RT-PCR (RSV, Flu A&B, Covid) Anterior Nasal Swab     Status: None   Collection Time: 06/07/22  3:35 PM   Specimen: Anterior Nasal Swab  Result Value Ref Range Status   SARS Coronavirus 2 by RT PCR NEGATIVE NEGATIVE Final     Comment: (NOTE) SARS-CoV-2 target nucleic acids are NOT DETECTED.  The SARS-CoV-2 RNA is generally detectable in upper respiratory specimens  during the acute phase of infection. The lowest concentration of SARS-CoV-2 viral copies this assay can detect is 138 copies/mL. A negative result does not preclude SARS-Cov-2 infection and should not be used as the sole basis for treatment or other patient management decisions. A negative result may occur with  improper specimen collection/handling, submission of specimen other than nasopharyngeal swab, presence of viral mutation(s) within the areas targeted by this assay, and inadequate number of viral copies(<138 copies/mL). A negative result must be combined with clinical observations, patient history, and epidemiological information. The expected result is Negative.  Fact Sheet for Patients:  EntrepreneurPulse.com.au  Fact Sheet for Healthcare Providers:  IncredibleEmployment.be  This test is no t yet approved or cleared by the [place] FDA and  has been authorized for detection and/or diagnosis of SARS-CoV-2 by FDA under an Emergency Use Authorization (EUA). This EUA will remain  in effect (meaning this test can be used) for the duration of the COVID-19 declaration under Section 564(b)(1) of the Act, 21 U.S.C.section 360bbb-3(b)(1), unless the authorization is terminated  or revoked sooner.       Influenza A by PCR NEGATIVE NEGATIVE Final   Influenza B by PCR NEGATIVE NEGATIVE Final    Comment: (NOTE) The Xpert Xpress SARS-CoV-2/FLU/RSV plus assay is intended as an aid in the diagnosis of influenza from Nasopharyngeal swab specimens and should not be used as a sole basis for treatment. Nasal washings and aspirates are unacceptable for Xpert Xpress SARS-CoV-2/FLU/RSV testing.  Fact Sheet for Patients: EntrepreneurPulse.com.au  Fact Sheet for Healthcare  Providers: IncredibleEmployment.be  This test is not yet approved or cleared by the [place] FDA and has been authorized for detection and/or diagnosis of SARS-CoV-2 by FDA under an Emergency Use Authorization (EUA). This EUA will remain in effect (meaning this test can be used) for the duration of the COVID-19 declaration under Section 564(b)(1) of the Act, 21 U.S.C. section 360bbb-3(b)(1), unless the authorization is terminated or revoked.     Resp Syncytial Virus by PCR NEGATIVE NEGATIVE Final    Comment: (NOTE) Fact Sheet for Patients: EntrepreneurPulse.com.au  Fact Sheet for Healthcare Providers: IncredibleEmployment.be  This test is not yet approved or cleared by the [place] FDA and has been authorized for detection and/or diagnosis of SARS-CoV-2 by FDA under an Emergency Use Authorization (EUA). This EUA will remain in effect (meaning this test can be used) for the duration of the COVID-19 declaration under Section 564(b)(1) of the Act, 21 U.S.C. section 360bbb-3(b)(1), unless the authorization is terminated or revoked.  Performed at [facility], [address] [place], [place] [address]   Blood culture (routine x 2)     Status: None (Preliminary result)   Collection Time: 06/07/22  4:00 PM   Specimen: BLOOD LEFT ARM  Result Value Ref Range Status   Specimen Description BLOOD LEFT ARM BOTTLES DRAWN AEROBIC AND ANAEROBIC  Final   Special Requests Blood Culture adequate volume  Final   Culture   Final    NO GROWTH 3 DAYS Performed at [facility], [address] [place], [place] [address]    Report Status PENDING  Incomplete  Gastrointestinal Panel by PCR , Stool     Status: None   Collection Time: 06/07/22  6:40 PM   Specimen: Stool  Result Value Ref Range Status   Campylobacter species NOT DETECTED NOT DETECTED Final   Plesimonas shigelloides NOT DETECTED NOT DETECTED Final   Salmonella species NOT  DETECTED NOT DETECTED Final   Yersinia enterocolitica NOT  DETECTED NOT DETECTED Final   Vibrio species NOT DETECTED NOT DETECTED Final   Vibrio cholerae NOT DETECTED NOT DETECTED Final   Enteroaggregative E coli (EAEC) NOT DETECTED NOT DETECTED Final   Enteropathogenic E coli (EPEC) NOT DETECTED NOT DETECTED Final   Enterotoxigenic E coli (ETEC) NOT DETECTED NOT DETECTED Final   Shiga like toxin producing E coli (STEC) NOT DETECTED NOT DETECTED Final   Shigella/Enteroinvasive E coli (EIEC) NOT DETECTED NOT DETECTED Final   Cryptosporidium NOT DETECTED NOT DETECTED Final   Cyclospora cayetanensis NOT DETECTED NOT DETECTED Final   Entamoeba histolytica NOT DETECTED NOT DETECTED Final   Giardia lamblia NOT DETECTED NOT DETECTED Final   Adenovirus F40/41 NOT DETECTED NOT DETECTED Final   Astrovirus NOT DETECTED NOT DETECTED Final   Norovirus GI/GII NOT DETECTED NOT DETECTED Final   Rotavirus A NOT DETECTED NOT DETECTED Final   Sapovirus (I, II, IV, and V) NOT DETECTED NOT DETECTED Final    Comment: Performed at [facility], [address] [place], [place] [address]  C Difficile Quick Screen w PCR reflex     Status: None   Collection Time: 06/07/22  6:40 PM   Specimen: Stool  Result Value Ref Range Status   C Diff antigen NEGATIVE NEGATIVE Final   C Diff toxin NEGATIVE NEGATIVE Final   C Diff interpretation No C. difficile detected.  Final    Comment: Performed at [facility], [address] [place], [place] [address]     Time coordinating discharge: 35 minutes  SIGNED:   Rodena Goldmann, DO [facility] 06/10/2022, 1:14 PM  If 7PM-7AM, please contact night-coverage [email or web address]

## 2022-06-10 NOTE — Progress Notes (Signed)
Alert and oriented times 4 this am. Out of bed to chair, with no c/o pain or discomfort noted. MD has advanced patient to a soft diet. Plan of care on going.

## 2022-06-10 NOTE — Progress Notes (Signed)
Subjective: She has minimal abdominal pain. She had a BM yesterday that was loose but not watery. No rectal bleeding or melena noted. No nausea or vomiting. She notes that she was told she could do a soft diet for dinner but they brought her liquids still and she did not want to eat the stuff that was brought so she drank an ensure. This morning she tried to eat some ice cream but noted it tasted sour, she drank some coffee and sprite.    Objective: Vital signs in last 24 hours: Temp:  [97.9 F (36.6 C)-98.6 F (37 C)] 97.9 F (36.6 C) (03/06 0823) Pulse Rate:  [59-79] 75 (03/06 0823) Resp:  [18-20] 18 (03/06 0823) BP: (112-136)/(51-74) 132/62 (03/06 0823) SpO2:  [97 %-99 %] 98 % (03/06 0823) Last BM Date : 06/07/22 General:   Alert and oriented, pleasant Head:  Normocephalic and atraumatic. Eyes:  No icterus, sclera clear. Conjuctiva pink.  Mouth:  Without lesions, mucosa pink and moist.  Heart:  S1, S2 present, no murmurs noted.  Lungs: Clear to auscultation bilaterally, without wheezing, rales, or rhonchi.  Abdomen:  Bowel sounds present, soft, non-distended. Mild TTP of diffuse Abdomen. No HSM or hernias noted. No rebound or guarding. No masses appreciated  Msk:  Symmetrical without gross deformities. Normal posture. Pulses:  Normal pulses noted. Extremities:  Without clubbing or edema. Neurologic:  Alert and  oriented x4;  grossly normal neurologically. Skin:  Warm and dry, intact without significant lesions.  Psych:  Alert and cooperative. Normal mood and affect.  Intake/Output from previous day: 03/05 0701 - 03/06 0700 In: 1100 [P.O.:1000; IV Piggyback:100] Out: 1550 [Urine:1550] Intake/Output this shift: Total I/O In: 120 [P.O.:120] Out: -   Lab Results: Recent Labs    06/08/22 0412 06/09/22 0350 06/10/22 0357  WBC 9.1 6.9 5.4  HGB 10.6* 9.7* 10.8*  HCT 32.2* 30.7* 33.3*  PLT 224 208 214   BMET Recent Labs    06/08/22 0412 06/09/22 0350 06/10/22 0357   NA 135 136 136  K 4.0 4.0 3.9  CL 108 110 106  CO2 19* 20* 22  GLUCOSE 77 84 79  BUN 34* 14 6*  CREATININE 1.77* 0.80 0.57  CALCIUM 7.9* 8.0* 8.1*   LFT Recent Labs    06/07/22 1540  PROT 6.8  ALBUMIN 3.3*  AST 15  ALT 12  ALKPHOS 47  BILITOT 0.5    Assessment: Joanna Sutton. Pleas is a 67 year old female with pmh of GERD, dysphagia, depression, HLD, HTN, PUD, diarrhea, new onset colitis in Dec 2023 with concern for ileitis as well with possible partial SBO, complicated by abscesses and requiring percutaneous drainage, returning 06/07/22 with recurrent abdominal pain, N/V/D found to have pancolitis on CT, progressed since prior CT w/small focally contained microperforation, dilated loops of small bowel suspected to be ileus, but unable to exclude developing obstruction.   Pancolitis: presented with abdominal discomfort this admission, though notably doing well at outpatient follow up in jan 2024, continues to take [facility] Med powders daily. negative C. difficile and GI pathogen panel. On broad spectrum IV abx with meropenem, clinically improving. Etiology unclear, cannot rule out IBS, query NSAID-induced colitis given ongoing BC powder use. Will need colonoscopy as outpatient and continue abx therapy x2 weeks. She is feeling some better today. Minimal abdominal pain. She is hungry. Had a loose BM yesterday   Anemia: hgb 12.7 on admission, has trended down as low as 9.7, up to 10.8 today.  Likely multifactorial, suspect some  aspect of hemodilution in setting of IVF, does Have hx of PUD in oct 2023, and in setting of acute colitis. No overt GI bleeding. She is taking PPI daily as outpatient but continues to use BC powders daily. Discussed cessation of NSAIDs in presence of hx of PUD and colitis that is potentially NSAID induced.    Plan: Outpatient colonoscopy 6-8 weeks  Consider EGD for follow up of PUD at time of colonoscopy Soft diet  Continue antibiotic x 14 days total Continue PPI daily Avoid  all NSAIDs   LOS: 3 days    06/10/2022, 8:51 AM  Tiersa Dayley L. Alver Sorrow, MSN, APRN, AGNP-C Adult-Gerontology Nurse Practitioner [facility] at [facility]

## 2022-06-10 NOTE — Progress Notes (Signed)
Patient slept on and off this shift. Morphine given once this shift for pain. Continued to monitor patient.   This am patient complaint of some ear pain, Ear wax noted in left ear.

## 2022-06-10 NOTE — Progress Notes (Signed)
Mobility Specialist Progress Note:    06/10/22 1100  Mobility  Activity Ambulated with assistance in room  Level of Assistance Standby assist, set-up cues, supervision of patient - no hands on  Assistive Device Front wheel walker  Distance Ambulated (ft) 200 ft  Activity Response Tolerated well  Mobility Referral Yes  $Mobility charge 1 Mobility   Pt was agreeable to mobility session. Ambulated in room, door and back multiple times with RW. Required only SV. Tolerated well, asx throughout. Returned pt to chair with all needs met, call bell in reach.   Royetta Crochet Mobility Specialist Please contact via Solicitor or  Rehab office at [phone]

## 2022-06-11 ENCOUNTER — Telehealth (INDEPENDENT_AMBULATORY_CARE_PROVIDER_SITE_OTHER): Payer: Self-pay

## 2022-06-11 NOTE — Telephone Encounter (Signed)
Patient was released from the hospital yesterday due to colitis and aki. Patient would like to know what she could eat.She is going to the grocery store and needs to know some items she can eat or avoid. She needs to know if she is able to eat broccoli cheddar soup. Please advise.

## 2022-06-11 NOTE — Telephone Encounter (Signed)
Patient aware she can do soft foods, low in fiber so she may want to avoid broccoli cheddar soup as the broccoli has fiber in it. She can do things such as mashed potatoes, yogurt, jello, soups, breads, noodles.   Patient states understanding.

## 2022-06-12 LAB — CULTURE, BLOOD (ROUTINE X 2)
Culture: NO GROWTH
Culture: NO GROWTH
Special Requests: ADEQUATE

## 2022-06-15 DIAGNOSIS — K5100 Ulcerative (chronic) pancolitis without complications: Secondary | ICD-10-CM | POA: Diagnosis not present

## 2022-06-15 DIAGNOSIS — I10 Essential (primary) hypertension: Secondary | ICD-10-CM | POA: Diagnosis not present

## 2022-06-15 DIAGNOSIS — Z09 Encounter for follow-up examination after completed treatment for conditions other than malignant neoplasm: Secondary | ICD-10-CM | POA: Diagnosis not present

## 2022-06-23 ENCOUNTER — Encounter (INDEPENDENT_AMBULATORY_CARE_PROVIDER_SITE_OTHER): Payer: Self-pay | Admitting: *Deleted

## 2022-06-26 DIAGNOSIS — I700 Atherosclerosis of aorta: Secondary | ICD-10-CM | POA: Diagnosis not present

## 2022-06-26 DIAGNOSIS — Z299 Encounter for prophylactic measures, unspecified: Secondary | ICD-10-CM | POA: Diagnosis not present

## 2022-06-26 DIAGNOSIS — Z1331 Encounter for screening for depression: Secondary | ICD-10-CM | POA: Diagnosis not present

## 2022-06-26 DIAGNOSIS — Z87891 Personal history of nicotine dependence: Secondary | ICD-10-CM | POA: Diagnosis not present

## 2022-06-26 DIAGNOSIS — I10 Essential (primary) hypertension: Secondary | ICD-10-CM | POA: Diagnosis not present

## 2022-06-26 DIAGNOSIS — Z7189 Other specified counseling: Secondary | ICD-10-CM | POA: Diagnosis not present

## 2022-06-26 DIAGNOSIS — Z0000 Encounter for general adult medical examination without abnormal findings: Secondary | ICD-10-CM | POA: Diagnosis not present

## 2022-06-26 DIAGNOSIS — Z1339 Encounter for screening examination for other mental health and behavioral disorders: Secondary | ICD-10-CM | POA: Diagnosis not present

## 2022-06-26 DIAGNOSIS — Z6823 Body mass index (BMI) 23.0-23.9, adult: Secondary | ICD-10-CM | POA: Diagnosis not present

## 2022-06-26 DIAGNOSIS — F339 Major depressive disorder, recurrent, unspecified: Secondary | ICD-10-CM | POA: Diagnosis not present

## 2022-07-02 DIAGNOSIS — H269 Unspecified cataract: Secondary | ICD-10-CM | POA: Diagnosis not present

## 2022-07-02 DIAGNOSIS — H25812 Combined forms of age-related cataract, left eye: Secondary | ICD-10-CM | POA: Diagnosis not present

## 2022-07-04 DIAGNOSIS — I10 Essential (primary) hypertension: Secondary | ICD-10-CM | POA: Diagnosis not present

## 2022-07-07 ENCOUNTER — Encounter: Payer: Self-pay | Admitting: Gastroenterology

## 2022-07-07 ENCOUNTER — Ambulatory Visit (INDEPENDENT_AMBULATORY_CARE_PROVIDER_SITE_OTHER): Payer: 59 | Admitting: Gastroenterology

## 2022-07-09 DIAGNOSIS — H04123 Dry eye syndrome of bilateral lacrimal glands: Secondary | ICD-10-CM | POA: Diagnosis not present

## 2022-07-21 DIAGNOSIS — B029 Zoster without complications: Secondary | ICD-10-CM | POA: Diagnosis not present

## 2022-07-21 DIAGNOSIS — Z299 Encounter for prophylactic measures, unspecified: Secondary | ICD-10-CM | POA: Diagnosis not present

## 2022-07-21 DIAGNOSIS — I10 Essential (primary) hypertension: Secondary | ICD-10-CM | POA: Diagnosis not present

## 2022-08-04 DIAGNOSIS — I10 Essential (primary) hypertension: Secondary | ICD-10-CM | POA: Diagnosis not present

## 2022-08-17 DIAGNOSIS — Z0000 Encounter for general adult medical examination without abnormal findings: Secondary | ICD-10-CM | POA: Diagnosis not present

## 2022-08-17 DIAGNOSIS — R5383 Other fatigue: Secondary | ICD-10-CM | POA: Diagnosis not present

## 2022-08-17 DIAGNOSIS — I10 Essential (primary) hypertension: Secondary | ICD-10-CM | POA: Diagnosis not present

## 2022-08-17 DIAGNOSIS — Z299 Encounter for prophylactic measures, unspecified: Secondary | ICD-10-CM | POA: Diagnosis not present

## 2022-08-17 DIAGNOSIS — Z79899 Other long term (current) drug therapy: Secondary | ICD-10-CM | POA: Diagnosis not present

## 2022-08-17 DIAGNOSIS — Z87891 Personal history of nicotine dependence: Secondary | ICD-10-CM | POA: Diagnosis not present

## 2022-08-17 DIAGNOSIS — E7800 Pure hypercholesterolemia, unspecified: Secondary | ICD-10-CM | POA: Diagnosis not present

## 2022-09-04 DIAGNOSIS — I10 Essential (primary) hypertension: Secondary | ICD-10-CM | POA: Diagnosis not present

## 2022-09-29 DIAGNOSIS — E2839 Other primary ovarian failure: Secondary | ICD-10-CM | POA: Diagnosis not present

## 2022-09-29 DIAGNOSIS — R0781 Pleurodynia: Secondary | ICD-10-CM | POA: Diagnosis not present

## 2022-09-29 DIAGNOSIS — I10 Essential (primary) hypertension: Secondary | ICD-10-CM | POA: Diagnosis not present

## 2022-09-29 DIAGNOSIS — Z299 Encounter for prophylactic measures, unspecified: Secondary | ICD-10-CM | POA: Diagnosis not present

## 2022-09-29 DIAGNOSIS — R071 Chest pain on breathing: Secondary | ICD-10-CM | POA: Diagnosis not present

## 2022-09-29 DIAGNOSIS — S2239XA Fracture of one rib, unspecified side, initial encounter for closed fracture: Secondary | ICD-10-CM | POA: Diagnosis not present

## 2022-10-04 DIAGNOSIS — I10 Essential (primary) hypertension: Secondary | ICD-10-CM | POA: Diagnosis not present

## 2022-10-12 ENCOUNTER — Other Ambulatory Visit: Payer: Self-pay | Admitting: Internal Medicine

## 2022-10-12 DIAGNOSIS — Z1231 Encounter for screening mammogram for malignant neoplasm of breast: Secondary | ICD-10-CM

## 2022-10-13 ENCOUNTER — Ambulatory Visit
Admission: RE | Admit: 2022-10-13 | Discharge: 2022-10-13 | Disposition: A | Discharge status: Home / Self Care | Payer: PPO | Source: Ambulatory Visit | Attending: Internal Medicine | Admitting: Internal Medicine

## 2022-10-13 ENCOUNTER — Other Ambulatory Visit: Payer: Self-pay | Admitting: Internal Medicine

## 2022-10-13 DIAGNOSIS — Z1231 Encounter for screening mammogram for malignant neoplasm of breast: Secondary | ICD-10-CM

## 2022-10-20 DIAGNOSIS — Z299 Encounter for prophylactic measures, unspecified: Secondary | ICD-10-CM | POA: Diagnosis not present

## 2022-10-20 DIAGNOSIS — M810 Age-related osteoporosis without current pathological fracture: Secondary | ICD-10-CM | POA: Diagnosis not present

## 2022-10-20 DIAGNOSIS — I10 Essential (primary) hypertension: Secondary | ICD-10-CM | POA: Diagnosis not present

## 2022-11-04 DIAGNOSIS — I10 Essential (primary) hypertension: Secondary | ICD-10-CM | POA: Diagnosis not present

## 2022-11-18 DIAGNOSIS — M810 Age-related osteoporosis without current pathological fracture: Secondary | ICD-10-CM | POA: Diagnosis not present

## 2022-11-23 DIAGNOSIS — Z299 Encounter for prophylactic measures, unspecified: Secondary | ICD-10-CM | POA: Diagnosis not present

## 2022-11-23 DIAGNOSIS — I700 Atherosclerosis of aorta: Secondary | ICD-10-CM | POA: Diagnosis not present

## 2022-11-23 DIAGNOSIS — R600 Localized edema: Secondary | ICD-10-CM | POA: Diagnosis not present

## 2022-11-23 DIAGNOSIS — I10 Essential (primary) hypertension: Secondary | ICD-10-CM | POA: Diagnosis not present

## 2022-11-23 DIAGNOSIS — F339 Major depressive disorder, recurrent, unspecified: Secondary | ICD-10-CM | POA: Diagnosis not present

## 2022-12-02 DIAGNOSIS — L821 Other seborrheic keratosis: Secondary | ICD-10-CM | POA: Diagnosis not present

## 2022-12-02 DIAGNOSIS — D485 Neoplasm of uncertain behavior of skin: Secondary | ICD-10-CM | POA: Diagnosis not present

## 2022-12-02 DIAGNOSIS — I10 Essential (primary) hypertension: Secondary | ICD-10-CM | POA: Diagnosis not present

## 2022-12-02 DIAGNOSIS — L820 Inflamed seborrheic keratosis: Secondary | ICD-10-CM | POA: Diagnosis not present

## 2022-12-02 DIAGNOSIS — Z299 Encounter for prophylactic measures, unspecified: Secondary | ICD-10-CM | POA: Diagnosis not present

## 2022-12-05 DIAGNOSIS — I10 Essential (primary) hypertension: Secondary | ICD-10-CM | POA: Diagnosis not present

## 2023-01-04 DIAGNOSIS — I10 Essential (primary) hypertension: Secondary | ICD-10-CM | POA: Diagnosis not present

## 2023-02-03 DIAGNOSIS — I10 Essential (primary) hypertension: Secondary | ICD-10-CM | POA: Diagnosis not present

## 2023-03-05 DIAGNOSIS — I10 Essential (primary) hypertension: Secondary | ICD-10-CM | POA: Diagnosis not present

## 2023-03-10 DIAGNOSIS — Z299 Encounter for prophylactic measures, unspecified: Secondary | ICD-10-CM | POA: Diagnosis not present

## 2023-03-10 DIAGNOSIS — E7800 Pure hypercholesterolemia, unspecified: Secondary | ICD-10-CM | POA: Diagnosis not present

## 2023-03-10 DIAGNOSIS — I700 Atherosclerosis of aorta: Secondary | ICD-10-CM | POA: Diagnosis not present

## 2023-03-10 DIAGNOSIS — I10 Essential (primary) hypertension: Secondary | ICD-10-CM | POA: Diagnosis not present

## 2023-04-05 DIAGNOSIS — I10 Essential (primary) hypertension: Secondary | ICD-10-CM | POA: Diagnosis not present

## 2023-04-05 DIAGNOSIS — Z6825 Body mass index (BMI) 25.0-25.9, adult: Secondary | ICD-10-CM | POA: Diagnosis not present

## 2023-04-05 DIAGNOSIS — Z299 Encounter for prophylactic measures, unspecified: Secondary | ICD-10-CM | POA: Diagnosis not present

## 2023-04-05 DIAGNOSIS — I700 Atherosclerosis of aorta: Secondary | ICD-10-CM | POA: Diagnosis not present

## 2023-04-05 DIAGNOSIS — M6530 Trigger finger, unspecified finger: Secondary | ICD-10-CM | POA: Diagnosis not present

## 2023-04-12 DIAGNOSIS — M65341 Trigger finger, right ring finger: Secondary | ICD-10-CM | POA: Diagnosis not present

## 2023-05-05 DIAGNOSIS — I10 Essential (primary) hypertension: Secondary | ICD-10-CM | POA: Diagnosis not present

## 2023-05-24 DIAGNOSIS — N390 Urinary tract infection, site not specified: Secondary | ICD-10-CM | POA: Diagnosis not present

## 2023-05-24 DIAGNOSIS — F339 Major depressive disorder, recurrent, unspecified: Secondary | ICD-10-CM | POA: Diagnosis not present

## 2023-05-24 DIAGNOSIS — Z299 Encounter for prophylactic measures, unspecified: Secondary | ICD-10-CM | POA: Diagnosis not present

## 2023-05-24 DIAGNOSIS — I700 Atherosclerosis of aorta: Secondary | ICD-10-CM | POA: Diagnosis not present

## 2023-06-01 DIAGNOSIS — R059 Cough, unspecified: Secondary | ICD-10-CM | POA: Diagnosis not present

## 2023-06-01 DIAGNOSIS — Z299 Encounter for prophylactic measures, unspecified: Secondary | ICD-10-CM | POA: Diagnosis not present

## 2023-06-01 DIAGNOSIS — I10 Essential (primary) hypertension: Secondary | ICD-10-CM | POA: Diagnosis not present

## 2023-06-01 DIAGNOSIS — J302 Other seasonal allergic rhinitis: Secondary | ICD-10-CM | POA: Diagnosis not present

## 2023-06-03 DIAGNOSIS — J069 Acute upper respiratory infection, unspecified: Secondary | ICD-10-CM | POA: Diagnosis not present

## 2023-06-03 DIAGNOSIS — R062 Wheezing: Secondary | ICD-10-CM | POA: Diagnosis not present

## 2023-06-03 DIAGNOSIS — Z299 Encounter for prophylactic measures, unspecified: Secondary | ICD-10-CM | POA: Diagnosis not present

## 2023-06-03 DIAGNOSIS — I10 Essential (primary) hypertension: Secondary | ICD-10-CM | POA: Diagnosis not present

## 2023-06-03 DIAGNOSIS — R051 Acute cough: Secondary | ICD-10-CM | POA: Diagnosis not present

## 2023-06-03 DIAGNOSIS — R059 Cough, unspecified: Secondary | ICD-10-CM | POA: Diagnosis not present

## 2023-06-04 DIAGNOSIS — J069 Acute upper respiratory infection, unspecified: Secondary | ICD-10-CM | POA: Diagnosis not present

## 2023-06-04 DIAGNOSIS — Z299 Encounter for prophylactic measures, unspecified: Secondary | ICD-10-CM | POA: Diagnosis not present

## 2023-06-04 DIAGNOSIS — I10 Essential (primary) hypertension: Secondary | ICD-10-CM | POA: Diagnosis not present

## 2023-06-09 DIAGNOSIS — Z299 Encounter for prophylactic measures, unspecified: Secondary | ICD-10-CM | POA: Diagnosis not present

## 2023-06-09 DIAGNOSIS — I10 Essential (primary) hypertension: Secondary | ICD-10-CM | POA: Diagnosis not present

## 2023-06-09 DIAGNOSIS — I700 Atherosclerosis of aorta: Secondary | ICD-10-CM | POA: Diagnosis not present

## 2023-07-04 DIAGNOSIS — I10 Essential (primary) hypertension: Secondary | ICD-10-CM | POA: Diagnosis not present

## 2023-07-08 DIAGNOSIS — I700 Atherosclerosis of aorta: Secondary | ICD-10-CM | POA: Diagnosis not present

## 2023-07-08 DIAGNOSIS — Z1339 Encounter for screening examination for other mental health and behavioral disorders: Secondary | ICD-10-CM | POA: Diagnosis not present

## 2023-07-08 DIAGNOSIS — Z87891 Personal history of nicotine dependence: Secondary | ICD-10-CM | POA: Diagnosis not present

## 2023-07-08 DIAGNOSIS — Z1331 Encounter for screening for depression: Secondary | ICD-10-CM | POA: Diagnosis not present

## 2023-07-08 DIAGNOSIS — F339 Major depressive disorder, recurrent, unspecified: Secondary | ICD-10-CM | POA: Diagnosis not present

## 2023-07-08 DIAGNOSIS — Z0000 Encounter for general adult medical examination without abnormal findings: Secondary | ICD-10-CM | POA: Diagnosis not present

## 2023-07-08 DIAGNOSIS — Z7189 Other specified counseling: Secondary | ICD-10-CM | POA: Diagnosis not present

## 2023-07-08 DIAGNOSIS — Z299 Encounter for prophylactic measures, unspecified: Secondary | ICD-10-CM | POA: Diagnosis not present

## 2023-07-08 DIAGNOSIS — I10 Essential (primary) hypertension: Secondary | ICD-10-CM | POA: Diagnosis not present

## 2023-07-08 DIAGNOSIS — R52 Pain, unspecified: Secondary | ICD-10-CM | POA: Diagnosis not present

## 2023-07-13 DIAGNOSIS — M810 Age-related osteoporosis without current pathological fracture: Secondary | ICD-10-CM | POA: Diagnosis not present

## 2023-08-04 DIAGNOSIS — I10 Essential (primary) hypertension: Secondary | ICD-10-CM | POA: Diagnosis not present

## 2023-09-04 DIAGNOSIS — I10 Essential (primary) hypertension: Secondary | ICD-10-CM | POA: Diagnosis not present

## 2023-10-01 ENCOUNTER — Other Ambulatory Visit: Payer: Self-pay | Admitting: Internal Medicine

## 2023-10-01 DIAGNOSIS — Z1231 Encounter for screening mammogram for malignant neoplasm of breast: Secondary | ICD-10-CM

## 2023-10-04 DIAGNOSIS — I10 Essential (primary) hypertension: Secondary | ICD-10-CM | POA: Diagnosis not present

## 2023-10-05 ENCOUNTER — Inpatient Hospital Stay: Admission: RE | Admit: 2023-10-05 | Source: Ambulatory Visit

## 2023-10-11 DIAGNOSIS — E7800 Pure hypercholesterolemia, unspecified: Secondary | ICD-10-CM | POA: Diagnosis not present

## 2023-10-11 DIAGNOSIS — Z299 Encounter for prophylactic measures, unspecified: Secondary | ICD-10-CM | POA: Diagnosis not present

## 2023-10-11 DIAGNOSIS — Z79899 Other long term (current) drug therapy: Secondary | ICD-10-CM | POA: Diagnosis not present

## 2023-10-11 DIAGNOSIS — I10 Essential (primary) hypertension: Secondary | ICD-10-CM | POA: Diagnosis not present

## 2023-10-11 DIAGNOSIS — R5383 Other fatigue: Secondary | ICD-10-CM | POA: Diagnosis not present

## 2023-10-11 DIAGNOSIS — Z0000 Encounter for general adult medical examination without abnormal findings: Secondary | ICD-10-CM | POA: Diagnosis not present

## 2023-10-30 DIAGNOSIS — R300 Dysuria: Secondary | ICD-10-CM | POA: Diagnosis not present

## 2023-11-04 DIAGNOSIS — I10 Essential (primary) hypertension: Secondary | ICD-10-CM | POA: Diagnosis not present

## 2023-12-04 DIAGNOSIS — I10 Essential (primary) hypertension: Secondary | ICD-10-CM | POA: Diagnosis not present

## 2023-12-07 ENCOUNTER — Ambulatory Visit
Admission: RE | Admit: 2023-12-07 | Discharge: 2023-12-07 | Disposition: A | Discharge status: Home / Self Care | Source: Ambulatory Visit | Attending: Internal Medicine | Admitting: Internal Medicine

## 2023-12-07 ENCOUNTER — Other Ambulatory Visit: Payer: Self-pay | Admitting: Internal Medicine

## 2023-12-07 DIAGNOSIS — Z1231 Encounter for screening mammogram for malignant neoplasm of breast: Secondary | ICD-10-CM

## 2024-01-03 DIAGNOSIS — Z08 Encounter for follow-up examination after completed treatment for malignant neoplasm: Secondary | ICD-10-CM | POA: Diagnosis not present

## 2024-01-03 DIAGNOSIS — C44311 Basal cell carcinoma of skin of nose: Secondary | ICD-10-CM | POA: Diagnosis not present

## 2024-01-03 DIAGNOSIS — Z85828 Personal history of other malignant neoplasm of skin: Secondary | ICD-10-CM | POA: Diagnosis not present

## 2024-01-04 DIAGNOSIS — I10 Essential (primary) hypertension: Secondary | ICD-10-CM | POA: Diagnosis not present

## 2024-01-07 DIAGNOSIS — Z299 Encounter for prophylactic measures, unspecified: Secondary | ICD-10-CM | POA: Diagnosis not present

## 2024-01-07 DIAGNOSIS — I10 Essential (primary) hypertension: Secondary | ICD-10-CM | POA: Diagnosis not present

## 2024-01-07 DIAGNOSIS — T148XXA Other injury of unspecified body region, initial encounter: Secondary | ICD-10-CM | POA: Diagnosis not present

## 2024-01-07 DIAGNOSIS — E7800 Pure hypercholesterolemia, unspecified: Secondary | ICD-10-CM | POA: Diagnosis not present

## 2024-01-31 DIAGNOSIS — M1712 Unilateral primary osteoarthritis, left knee: Secondary | ICD-10-CM | POA: Diagnosis not present

## 2024-02-14 DIAGNOSIS — B078 Other viral warts: Secondary | ICD-10-CM | POA: Diagnosis not present

## 2024-02-14 DIAGNOSIS — C44311 Basal cell carcinoma of skin of nose: Secondary | ICD-10-CM | POA: Diagnosis not present

## 2024-03-18 DIAGNOSIS — H40033 Anatomical narrow angle, bilateral: Secondary | ICD-10-CM | POA: Diagnosis not present

## 2024-03-18 DIAGNOSIS — H2513 Age-related nuclear cataract, bilateral: Secondary | ICD-10-CM | POA: Diagnosis not present
